# Patient Record
Sex: Female | Born: 1955 | Race: Black or African American | Hispanic: No | Marital: Married | Smoking: Never smoker
Health system: Southern US, Community
[De-identification: ages and names within clinical notes are randomized; demographics above are authoritative.]

## PROBLEM LIST (undated history)

## (undated) DIAGNOSIS — M545 Low back pain, unspecified: Secondary | ICD-10-CM

## (undated) DIAGNOSIS — I639 Cerebral infarction, unspecified: Secondary | ICD-10-CM

## (undated) DIAGNOSIS — E785 Hyperlipidemia, unspecified: Secondary | ICD-10-CM

## (undated) DIAGNOSIS — Z91018 Allergy to other foods: Secondary | ICD-10-CM

## (undated) DIAGNOSIS — D1771 Benign lipomatous neoplasm of kidney: Secondary | ICD-10-CM

## (undated) DIAGNOSIS — T4145XA Adverse effect of unspecified anesthetic, initial encounter: Secondary | ICD-10-CM

## (undated) DIAGNOSIS — M1990 Unspecified osteoarthritis, unspecified site: Secondary | ICD-10-CM

## (undated) DIAGNOSIS — J40 Bronchitis, not specified as acute or chronic: Secondary | ICD-10-CM

## (undated) DIAGNOSIS — R7303 Prediabetes: Secondary | ICD-10-CM

## (undated) DIAGNOSIS — K219 Gastro-esophageal reflux disease without esophagitis: Secondary | ICD-10-CM

## (undated) DIAGNOSIS — R112 Nausea with vomiting, unspecified: Secondary | ICD-10-CM

## (undated) DIAGNOSIS — Z9889 Other specified postprocedural states: Secondary | ICD-10-CM

## (undated) DIAGNOSIS — H409 Unspecified glaucoma: Secondary | ICD-10-CM

## (undated) DIAGNOSIS — H47099 Other disorders of optic nerve, not elsewhere classified, unspecified eye: Secondary | ICD-10-CM

## (undated) DIAGNOSIS — J3089 Other allergic rhinitis: Secondary | ICD-10-CM

## (undated) DIAGNOSIS — G4730 Sleep apnea, unspecified: Secondary | ICD-10-CM

## (undated) DIAGNOSIS — G459 Transient cerebral ischemic attack, unspecified: Secondary | ICD-10-CM

## (undated) DIAGNOSIS — J189 Pneumonia, unspecified organism: Secondary | ICD-10-CM

## (undated) DIAGNOSIS — T8859XA Other complications of anesthesia, initial encounter: Secondary | ICD-10-CM

## (undated) DIAGNOSIS — I10 Essential (primary) hypertension: Secondary | ICD-10-CM

## (undated) DIAGNOSIS — E559 Vitamin D deficiency, unspecified: Secondary | ICD-10-CM

## (undated) DIAGNOSIS — M069 Rheumatoid arthritis, unspecified: Secondary | ICD-10-CM

## (undated) DIAGNOSIS — E119 Type 2 diabetes mellitus without complications: Secondary | ICD-10-CM

## (undated) HISTORY — DX: Low back pain: M54.5

## (undated) HISTORY — PX: CATARACT EXTRACTION: SUR2

## (undated) HISTORY — PX: BREAST CYST EXCISION: SHX579

## (undated) HISTORY — PX: COLONOSCOPY: SHX174

## (undated) HISTORY — DX: Benign lipomatous neoplasm of kidney: D17.71

## (undated) HISTORY — DX: Allergy to other foods: Z91.018

## (undated) HISTORY — PX: BACK SURGERY: SHX140

## (undated) HISTORY — PX: FINGER SURGERY: SHX640

## (undated) HISTORY — DX: Cerebral infarction, unspecified: I63.9

## (undated) HISTORY — PX: BUNIONECTOMY: SHX129

## (undated) HISTORY — DX: Vitamin D deficiency, unspecified: E55.9

## (undated) HISTORY — DX: Transient cerebral ischemic attack, unspecified: G45.9

## (undated) HISTORY — DX: Rheumatoid arthritis, unspecified: M06.9

## (undated) HISTORY — DX: Low back pain, unspecified: M54.50

## (undated) HISTORY — PX: EYE SURGERY: SHX253

## (undated) HISTORY — DX: Prediabetes: R73.03

## (undated) HISTORY — PX: ABDOMINAL HYSTERECTOMY: SHX81

## (undated) HISTORY — DX: Sleep apnea, unspecified: G47.30

---

## 2000-01-16 ENCOUNTER — Other Ambulatory Visit: Admission: RE | Admit: 2000-01-16 | Discharge: 2000-01-16 | Payer: Self-pay | Admitting: Obstetrics and Gynecology

## 2000-01-28 ENCOUNTER — Encounter: Payer: Self-pay | Admitting: Obstetrics and Gynecology

## 2000-01-28 ENCOUNTER — Encounter: Admission: RE | Admit: 2000-01-28 | Discharge: 2000-01-28 | Payer: Self-pay | Admitting: Obstetrics and Gynecology

## 2001-01-19 ENCOUNTER — Other Ambulatory Visit: Admission: RE | Admit: 2001-01-19 | Discharge: 2001-01-19 | Payer: Self-pay | Admitting: Obstetrics and Gynecology

## 2001-02-04 ENCOUNTER — Encounter: Admission: RE | Admit: 2001-02-04 | Discharge: 2001-02-04 | Payer: Self-pay | Admitting: Obstetrics and Gynecology

## 2001-02-04 ENCOUNTER — Encounter: Payer: Self-pay | Admitting: Obstetrics and Gynecology

## 2002-03-04 ENCOUNTER — Other Ambulatory Visit: Admission: RE | Admit: 2002-03-04 | Discharge: 2002-03-04 | Payer: Self-pay | Admitting: Obstetrics and Gynecology

## 2002-03-28 ENCOUNTER — Encounter: Payer: Self-pay | Admitting: Obstetrics and Gynecology

## 2002-03-28 ENCOUNTER — Encounter: Admission: RE | Admit: 2002-03-28 | Discharge: 2002-03-28 | Payer: Self-pay | Admitting: Obstetrics and Gynecology

## 2003-01-04 ENCOUNTER — Encounter: Admission: RE | Admit: 2003-01-04 | Discharge: 2003-01-04 | Payer: Self-pay | Admitting: Family Medicine

## 2003-01-04 ENCOUNTER — Encounter: Payer: Self-pay | Admitting: Family Medicine

## 2003-01-17 ENCOUNTER — Encounter: Payer: Self-pay | Admitting: Family Medicine

## 2003-01-17 ENCOUNTER — Encounter: Admission: RE | Admit: 2003-01-17 | Discharge: 2003-01-17 | Payer: Self-pay | Admitting: Family Medicine

## 2003-01-31 ENCOUNTER — Encounter: Admission: RE | Admit: 2003-01-31 | Discharge: 2003-01-31 | Payer: Self-pay | Admitting: Family Medicine

## 2003-01-31 ENCOUNTER — Encounter: Payer: Self-pay | Admitting: Family Medicine

## 2003-05-15 ENCOUNTER — Other Ambulatory Visit: Admission: RE | Admit: 2003-05-15 | Discharge: 2003-05-15 | Payer: Self-pay | Admitting: Obstetrics and Gynecology

## 2003-06-19 ENCOUNTER — Encounter: Admission: RE | Admit: 2003-06-19 | Discharge: 2003-06-19 | Payer: Self-pay | Admitting: Obstetrics and Gynecology

## 2003-06-30 ENCOUNTER — Encounter: Admission: RE | Admit: 2003-06-30 | Discharge: 2003-06-30 | Payer: Self-pay | Admitting: General Surgery

## 2004-02-02 ENCOUNTER — Encounter: Admission: RE | Admit: 2004-02-02 | Discharge: 2004-02-02 | Payer: Self-pay | Admitting: Obstetrics and Gynecology

## 2005-03-03 ENCOUNTER — Ambulatory Visit: Payer: Self-pay | Admitting: Internal Medicine

## 2005-03-14 ENCOUNTER — Encounter: Admission: RE | Admit: 2005-03-14 | Discharge: 2005-03-14 | Payer: Self-pay | Admitting: Obstetrics and Gynecology

## 2005-03-14 ENCOUNTER — Ambulatory Visit: Payer: Self-pay | Admitting: Internal Medicine

## 2005-03-17 ENCOUNTER — Ambulatory Visit: Payer: Self-pay | Admitting: Internal Medicine

## 2005-05-06 ENCOUNTER — Ambulatory Visit: Payer: Self-pay | Admitting: Internal Medicine

## 2006-04-10 ENCOUNTER — Encounter: Admission: RE | Admit: 2006-04-10 | Discharge: 2006-04-10 | Payer: Self-pay | Admitting: Obstetrics and Gynecology

## 2006-04-22 ENCOUNTER — Encounter: Admission: RE | Admit: 2006-04-22 | Discharge: 2006-04-22 | Payer: Self-pay | Admitting: Obstetrics and Gynecology

## 2006-10-02 ENCOUNTER — Ambulatory Visit: Payer: Self-pay | Admitting: Internal Medicine

## 2006-10-02 LAB — CONVERTED CEMR LAB
ALT: 25 units/L (ref 0–40)
Albumin: 3.3 g/dL — ABNORMAL LOW (ref 3.5–5.2)
Alkaline Phosphatase: 58 units/L (ref 39–117)
BUN: 11 mg/dL (ref 6–23)
Basophils Absolute: 0 10*3/uL (ref 0.0–0.1)
Basophils Relative: 0.1 % (ref 0.0–1.0)
CO2: 30 meq/L (ref 19–32)
Calcium: 9.1 mg/dL (ref 8.4–10.5)
Creatinine, Ser: 0.8 mg/dL (ref 0.4–1.2)
GFR calc Af Amer: 97 mL/min
Monocytes Relative: 7.6 % (ref 3.0–11.0)
Platelets: 355 10*3/uL (ref 150–400)
RDW: 11.9 % (ref 11.5–14.6)
Total Protein: 6.8 g/dL (ref 6.0–8.3)

## 2007-06-17 ENCOUNTER — Encounter: Admission: RE | Admit: 2007-06-17 | Discharge: 2007-06-17 | Payer: Self-pay | Admitting: Obstetrics and Gynecology

## 2007-07-23 DIAGNOSIS — K589 Irritable bowel syndrome without diarrhea: Secondary | ICD-10-CM | POA: Insufficient documentation

## 2007-07-23 DIAGNOSIS — M899 Disorder of bone, unspecified: Secondary | ICD-10-CM | POA: Insufficient documentation

## 2007-07-23 DIAGNOSIS — K219 Gastro-esophageal reflux disease without esophagitis: Secondary | ICD-10-CM | POA: Insufficient documentation

## 2007-07-23 DIAGNOSIS — G43909 Migraine, unspecified, not intractable, without status migrainosus: Secondary | ICD-10-CM | POA: Insufficient documentation

## 2007-07-23 DIAGNOSIS — M949 Disorder of cartilage, unspecified: Secondary | ICD-10-CM

## 2007-07-26 ENCOUNTER — Ambulatory Visit: Payer: Self-pay | Admitting: Internal Medicine

## 2008-02-11 ENCOUNTER — Ambulatory Visit (HOSPITAL_COMMUNITY): Admission: RE | Admit: 2008-02-11 | Discharge: 2008-02-12 | Payer: Self-pay | Admitting: Neurosurgery

## 2008-06-22 ENCOUNTER — Encounter: Admission: RE | Admit: 2008-06-22 | Discharge: 2008-06-22 | Payer: Self-pay | Admitting: Obstetrics and Gynecology

## 2008-07-06 ENCOUNTER — Ambulatory Visit: Payer: Self-pay | Admitting: Internal Medicine

## 2008-07-06 DIAGNOSIS — M940 Chondrocostal junction syndrome [Tietze]: Secondary | ICD-10-CM

## 2008-07-06 DIAGNOSIS — T7840XA Allergy, unspecified, initial encounter: Secondary | ICD-10-CM | POA: Insufficient documentation

## 2008-07-06 DIAGNOSIS — L272 Dermatitis due to ingested food: Secondary | ICD-10-CM

## 2009-07-09 ENCOUNTER — Encounter: Admission: RE | Admit: 2009-07-09 | Discharge: 2009-07-09 | Payer: Self-pay | Admitting: Obstetrics and Gynecology

## 2010-01-24 ENCOUNTER — Encounter: Admission: RE | Admit: 2010-01-24 | Discharge: 2010-01-24 | Payer: Self-pay | Admitting: Neurosurgery

## 2010-02-27 ENCOUNTER — Inpatient Hospital Stay (HOSPITAL_COMMUNITY): Admission: RE | Admit: 2010-02-27 | Discharge: 2010-03-03 | Payer: Self-pay | Admitting: Neurosurgery

## 2010-04-09 ENCOUNTER — Encounter: Admission: RE | Admit: 2010-04-09 | Discharge: 2010-04-09 | Payer: Self-pay | Admitting: Neurosurgery

## 2010-05-14 ENCOUNTER — Encounter
Admission: RE | Admit: 2010-05-14 | Discharge: 2010-05-14 | Payer: Self-pay | Source: Home / Self Care | Attending: Neurosurgery | Admitting: Neurosurgery

## 2010-06-27 ENCOUNTER — Encounter
Admission: RE | Admit: 2010-06-27 | Discharge: 2010-06-27 | Payer: Self-pay | Source: Home / Self Care | Attending: Neurosurgery | Admitting: Neurosurgery

## 2010-07-17 ENCOUNTER — Other Ambulatory Visit: Payer: Self-pay | Admitting: Obstetrics and Gynecology

## 2010-07-17 DIAGNOSIS — Z1231 Encounter for screening mammogram for malignant neoplasm of breast: Secondary | ICD-10-CM

## 2010-07-17 DIAGNOSIS — M8580 Other specified disorders of bone density and structure, unspecified site: Secondary | ICD-10-CM

## 2010-07-24 ENCOUNTER — Ambulatory Visit
Admission: RE | Admit: 2010-07-24 | Discharge: 2010-07-24 | Disposition: A | Discharge status: Home / Self Care | Payer: BC Managed Care – PPO | Source: Ambulatory Visit | Attending: Obstetrics and Gynecology | Admitting: Obstetrics and Gynecology

## 2010-07-24 DIAGNOSIS — M8580 Other specified disorders of bone density and structure, unspecified site: Secondary | ICD-10-CM

## 2010-07-24 DIAGNOSIS — Z1231 Encounter for screening mammogram for malignant neoplasm of breast: Secondary | ICD-10-CM

## 2010-08-15 LAB — CBC
HCT: 40.2 % (ref 36.0–46.0)
MCH: 32.8 pg (ref 26.0–34.0)
MCV: 98.5 fL (ref 78.0–100.0)
Platelets: 339 10*3/uL (ref 150–400)
RBC: 4.08 MIL/uL (ref 3.87–5.11)

## 2010-10-01 ENCOUNTER — Other Ambulatory Visit: Payer: Self-pay | Admitting: Neurosurgery

## 2010-10-01 DIAGNOSIS — M545 Low back pain: Secondary | ICD-10-CM

## 2010-10-15 NOTE — Assessment & Plan Note (Signed)
[facility]                         GASTROENTEROLOGY OFFICE NOTE   NAME:[name], [name] LANES                      MRN:          [id number]  DATE:07/26/2007                            DOB:          [birth year]    CHIEF COMPLAINT:  Acid reflux in the evening and upper back pains as  well and not sleeping well due to reflux worsening.   HISTORY:  [name] [name] is on pantoprazole 40 mg daily.  It does a good job  throughout the day usually.  Sometimes at night she wakens with sort of  back discomfort and a burning in her chest.  She is not smoking, she has  minimal caffeine.  Her weight is going up despite trying to lose.  She  is 209 pounds today, she was 202 pounds in May of 2008, in 2006 she was  186 pounds.  She had labs in May of 2008 with a normal TSH, normal  metabolic panel, CBC, CMET, i.e. except for an albumin of 3.3 and a  trivial elevated glucose of 101.  She is not describing dysphagia.  She  is walking daily.  She is trying to cut portions, she tells me.  If she  forgets her Protonix she says she cannot eat at all.  She brings a sheet  showing that she has FOOD REACTIONS TO MSG, YELLOW ONIONS, DAIRY,  PRESERVATIVES, EGGPLANT SKIN, APPLE SKIN, CORNMEAL, PEANUTS, RED FOOD  COLORING.  SHE HAS GRASS ALLERGIES.  Certain plants and trees cause a  rash like corn plant and grass and other trees irritate her eyes and  nose.  SHE HAS DUST ALLERGIES, AMMONIA AND CERTAIN PERFUME ALLERGIES.  She is followed by Dr. Larina Bras at [facility].   DRUG ALLERGIES:  1. AUGMENTIN.  2. ERYTHROMYCIN.  3. CODEINE DOES CAUSE VOMITING.   CURRENT MEDICATIONS:  1. Cenestin 1.5 mg daily.  2. Singulair 10 mg daily.  3. Multivitamin daily.  4. Protonix 40 mg daily.  5. Allergy shots.   REVIEW OF SYSTEMS:  As above.  She is also complaining of a left upper  quadrant and a right lower groin pain.  They come and go without clear  precipitants or  triggers.  They have been there for about 6 months.  She  is somewhat concerned that she could have a serious problem related to  this.  She had a lung nodule in the right lower lobe with CTs twice over  the last year that show stability.  She is wondering about getting a CT  of the abdomen.   PAST MEDICAL HISTORY:  See note Oct 22, 2006, plus above.  EGD,  colonoscopy had previously been performed with unrevealing findings  except for nonerosive gastritis (H. pylori negative) and hemorrhoids and  an irritable bowel response on the colonoscopy.   SOCIAL HISTORY:  She is not a smoker.   PHYSICAL EXAM:  Obese, pleasant black woman in no acute distress.  Blood  pressure is 162/90, pulse 100, weight 209 pounds.  EYES:  Anicteric.  NECK:  Supple.  CHEST:  Clear, resonant.  HEART:  S1 S2.  I hear no rubs, murmurs or gallops.  The chest wall does  not appear tender.  ABDOMEN:  Soft.  There is some minimal tenderness to deep palpation in  the right groin.  Otherwise, there is no pain with hip flexion or  movement of the lower extremity there.  Her abdomen is otherwise obese  and benign without organomegaly and mass and no secussion splash.  Bowel  sounds are present, there are no bruits.  Skin is warm and dry without  rash in the upper trunk.  PSYCH:  She has an appropriate affect.  LYMPH NODES:  No neck or supraclavicular nodes palpated.   ASSESSMENT:  1. Gastroesophageal reflux disease with worsening symptoms.  2. Obesity with increasing weight despite measures to try to lose,      normal TSH almost a year ago.  3. Irritable bowel syndrome.  4. Abdominal pain, mainly right groin pain for the past 6 months, no      worrisome features here.   PLAN:  1. Discontinue pantoprazole and try Nexium 40 mg daily.  2. Continue to try to lose weight but she should have a followup visit      with her primary care physician in [place] (Dr. Renella Cunas),      [facility] in [place].  She can  have repeat labs, she can have      recheck of her blood pressure.  Her albumin was slightly down.  She      certainly may need urinalysis and followup of that through primary      care, question if she has urinary or kidney losses of albumin.  I      would not perform a CT scan for abdominal pain in what we are      seeing here.  3. She can follow up with Gynecology to discuss how long she needs to      take hormone replacement therapy.  4. I will see her back as needed.  She was given samples of the Nexium      for about 2 weeks and a 90-day prescription.  If that is not      working, she should return.  I do not think any other investigation      is necessary based upon what I am hearing and seeing right now.     Iva Boop, MD,FACG  Electronically Signed    CEG/MedQ  DD: 07/26/2007  DT: 07/26/2007  Job #: [id number]

## 2010-10-15 NOTE — Op Note (Signed)
[name], [name]               ACCOUNT NO.:  [id number]   MEDICAL RECORD NO.:  [id number]          PATIENT TYPE:  OIB   LOCATION:  3538                         FACILITY:  [facility]   PHYSICIAN:  Donalee Citrin, M.D.        DATE OF BIRTH:  1955/11/16   DATE OF PROCEDURE:  02/11/2008  DATE OF DISCHARGE:                               OPERATIVE REPORT   PREOPERATIVE DIAGNOSIS:  Right L5 radiculopathy from ruptured disk L4-5  right.   PROCEDURES:  1. Lumbar laminectomy.  2. Microdiskectomy L4-5.  3. Microscopic dissection of the right L5 nerve root.  4. Microdiskectomy.   SURGEON:  Donalee Citrin, MD   ASSISTANT:  Reinaldo Meeker, MD   ANESTHESIA:  General endotracheal.   HISTORY OF PRESENT ILLNESS:  The patient is a very pleasant 55 year old  female who has had progressive worsening back and right leg pain  radiating down the back of her leg, calf, foot, big toe consistent with  L5 nerve root pattern.  MRI scan showed annular tear and disk bulge  causing severe lateral recess stenosis on the L5 root at L4-5.  The  risks and benefits of the operation were explained to the patient and  she understands and agreed to proceed forward.   The patient was brought to the OR, induced under general anesthesia,  positioned prone on the Pfister frame.  Her back was prepped  and draped  in the usual sterile fashion.  A preoperative x-ray localized the  appropriate level.  After infiltration with 10 mL of lidocaine with  epinephrine, a midline incision was made and Bovie electrocautery was  used to take down the subperiosteal.  Dissection was carried down the  lamina of L4 and L5 on the right.  Intraoperative x-ray confirmed the  position at appropriate level.  Then using a high-speed drill, the  inferior aspect of the L4 medial facet complex, the superior aspect of  the L5 was drilled down.  Then using a 2 and 3-mm Kerrison punch, the  undersurface of lamina and medial facet complex was bitten away  exposing  the ligamentum flavum which was removed in a piecemeal fashion exposing  the thecal sac.  Then working cephalocaudally and underbiting the facet  complex lateralized to the dura was identified as the proximal L5 nerve  root.  This was all decompressed out the foramen.  The foramen was  unroofed.  The disk was inspected and was noted to be bulging causing  severe stenosis in the undersurface of the fiber.  Annulotomy was made  with an 11-blade scalpel and the disk space was cleaned out with  pituitary rongeurs.  Then at the end of the diskectomy there was no  further stenosis on the thecal sac or L5 nerve root.  The undersurface  of the medial facet complex was extended superiorly.  The L4 neural  foramen was then explored with a coronary dilator and angled hockey  stick and noted to be widely patent as well and at the end of the  diskectomy, there was no further stenosis on either the L4 or  the L5  root and thecal sac laterally. So the wound was then copiously irrigated  and meticulous hemostasis was maintained.  Gelfoam was laid  on top of the dura.  Muscle fascia was reapproximated in layers with  interrupted Vicryl and the skin was closed with running 4-0  subcuticular.  Benzoin and Steri-strips were applied.  The patient went  to recovery room in stable condition.  At the end of the case,  __________ counts were correct.           ______________________________  Donalee Citrin, M.D.     GC/MEDQ  D:  02/11/2008  T:  02/12/2008  Job:  [id number]

## 2010-10-18 NOTE — Assessment & Plan Note (Signed)
[facility]                         GASTROENTEROLOGY OFFICE NOTE   NAME:[name] [name], [name] [name]                      MRN:          [id number]  DATE:10/02/2006                            DOB:          [birth year]    ADDENDUM   We will also check a CMET, CBC with diff, and TSH for a general lab  profile on her.  Hypothyroidism could certainly cause weight gain and  some of these problems as well.     Iva Boop, MD,FACG  Electronically Signed    CEG/MedQ  DD: 10/02/2006  DT: 10/02/2006  Job #: [id number]

## 2010-10-18 NOTE — Assessment & Plan Note (Signed)
[name]                         GASTROENTEROLOGY OFFICE NOTE   NAME:[name] [name], [name] [name]                      MRN:          [id number]  DATE:10/02/2006                            DOB:          1956-04-30    CHIEF COMPLAINT:  Reflux.  Bloating.   [name] [name].  I have not seen her since December 2006.  She has had a  lot of chronic bloating, early satiety, and reflux.  At that time, she  was placed on Protonix, which worked well for heartburn, but did not  really help her early satiety and bloating.  EGD and colonoscopy have  shown nonerosive gastritis and a normal colonoscopy, except for  hemorrhoids.  There was a strong irritable bowel response.  She is  basically having the same problems.  Her weight is up 15 pounds or more  since that time, though she says she does not eat very much at all.  She  feels bloated, and when she tries to exercise at work, at the end of the  day, she feels like her food has not emptied and she is regurgitating.  Particularly with lunch, she has problems where she feels early satiety  and a lot of bloating.  Bowel movements are good on Activa, yogurt, and  increased fiber, and green vegetables (i.e., regular bowel movements  without problems).  She still has problems with a lot of food allergies  and MSG problems.   PAST MEDICAL HISTORY:  1. Nonerosive gastritis.  2. Gastroesophageal reflux disease.  3. Irritable bowel syndrome.  4. External hemorrhoids.  5. L4-5 herniated disk.  6. Total abdominal hysterectomy.  7. Osteopenia.  8. Right breast lump in the past.  9. Migraine headaches.  10.Prior bunionectomy.  11.Multiple allergies.   DRUG ALLERGIES:  CODEINE causes nausea and vomiting.  ERYTHROMYCIN  causes swelling.   MEDICATIONS:  1. Cenestin 1.5 mg daily.  2. Singulair 10 mg daily.  3. Allergy shots.  4. Multivitamin p.r.n.   PHYSICAL EXAM:  Weight 202 pounds, pulse 96, blood pressure 140/80.  EYES:   Anicteric.  NECK:  Supple.  No thyromegaly or mass.  ABDOMEN:  Obese, soft, and nontender.  No organomegaly or mass.  Bowel  sounds present.  PSYCH:  She is alert and oriented x3.   ASSESSMENT:  1. Gastroesophageal reflux disease.  2. Bloating, early satiety, question functional dyspepsia, gastric      dysmotility.  She really is not at any great risk for gastroparesis      and she does not vomit, so I do not think it is that.  I think it      is her irritable bowel.  3. Obesity.   PLAN:  1. Protonix 40 mg daily will be re-instituted.  She has not had that      in several months when she ran out and her prescription ended.  2. Trial of metoclopramide 5 mg before meals 1 to 2 as needed.  She is      warned about possible neuropathy or dystonic reaction in the short      term.  We  will determine if long-term use is appropriate.  She is      to use it p.r.n.  3. Follow up in 6 to 8 weeks.  4. Regarding her obesity, caloric restriction is discussed.  I told      her to try to count her calories to see how much she is eating.      S[employer] Buster[employer] Diet or [facility] Diet are options discussed.     [name] Boop, MD,FACG  Electronically Signed    CEG/MedQ  DD: 10/02/2006  DT: 10/02/2006  Job #: [id number]

## 2011-02-13 ENCOUNTER — Other Ambulatory Visit: Payer: BC Managed Care – PPO

## 2011-02-13 ENCOUNTER — Ambulatory Visit
Admission: RE | Admit: 2011-02-13 | Discharge: 2011-02-13 | Disposition: A | Discharge status: Home / Self Care | Payer: BC Managed Care – PPO | Source: Ambulatory Visit | Attending: Neurosurgery | Admitting: Neurosurgery

## 2011-02-13 DIAGNOSIS — M545 Low back pain: Secondary | ICD-10-CM

## 2011-03-05 LAB — CBC
HCT: 38.4
Hemoglobin: 13.3
MCHC: 34.5
MCV: 94.7
RBC: 4.06

## 2011-03-05 LAB — BASIC METABOLIC PANEL
CO2: 25
Chloride: 106
GFR calc Af Amer: >60
Potassium: 3.9
Sodium: 139

## 2011-08-27 ENCOUNTER — Other Ambulatory Visit: Payer: Self-pay | Admitting: Obstetrics and Gynecology

## 2011-08-27 DIAGNOSIS — Z1231 Encounter for screening mammogram for malignant neoplasm of breast: Secondary | ICD-10-CM

## 2011-09-02 ENCOUNTER — Ambulatory Visit
Admission: RE | Admit: 2011-09-02 | Discharge: 2011-09-02 | Disposition: A | Discharge status: Home / Self Care | Payer: BC Managed Care – PPO | Source: Ambulatory Visit | Attending: Obstetrics and Gynecology | Admitting: Obstetrics and Gynecology

## 2011-09-02 DIAGNOSIS — Z1231 Encounter for screening mammogram for malignant neoplasm of breast: Secondary | ICD-10-CM

## 2012-06-02 DIAGNOSIS — J189 Pneumonia, unspecified organism: Secondary | ICD-10-CM

## 2012-06-02 HISTORY — DX: Pneumonia, unspecified organism: J18.9

## 2012-07-27 ENCOUNTER — Other Ambulatory Visit: Payer: Self-pay | Admitting: Obstetrics and Gynecology

## 2012-07-27 DIAGNOSIS — Z1231 Encounter for screening mammogram for malignant neoplasm of breast: Secondary | ICD-10-CM

## 2012-08-25 ENCOUNTER — Other Ambulatory Visit: Payer: Self-pay | Admitting: Endocrinology

## 2012-08-25 DIAGNOSIS — M8580 Other specified disorders of bone density and structure, unspecified site: Secondary | ICD-10-CM

## 2012-09-02 ENCOUNTER — Ambulatory Visit: Payer: BC Managed Care – PPO

## 2012-09-03 ENCOUNTER — Other Ambulatory Visit: Payer: BC Managed Care – PPO

## 2012-09-21 ENCOUNTER — Ambulatory Visit: Payer: BC Managed Care – PPO

## 2012-09-21 ENCOUNTER — Other Ambulatory Visit: Payer: BC Managed Care – PPO

## 2012-10-18 ENCOUNTER — Ambulatory Visit
Admission: RE | Admit: 2012-10-18 | Discharge: 2012-10-18 | Disposition: A | Discharge status: Home / Self Care | Payer: BC Managed Care – PPO | Source: Ambulatory Visit | Attending: Endocrinology | Admitting: Endocrinology

## 2012-10-18 ENCOUNTER — Ambulatory Visit
Admission: RE | Admit: 2012-10-18 | Discharge: 2012-10-18 | Disposition: A | Discharge status: Home / Self Care | Payer: BC Managed Care – PPO | Source: Ambulatory Visit | Attending: Obstetrics and Gynecology | Admitting: Obstetrics and Gynecology

## 2012-10-18 DIAGNOSIS — M8580 Other specified disorders of bone density and structure, unspecified site: Secondary | ICD-10-CM

## 2012-10-18 DIAGNOSIS — Z1231 Encounter for screening mammogram for malignant neoplasm of breast: Secondary | ICD-10-CM

## 2012-10-19 ENCOUNTER — Other Ambulatory Visit: Payer: Self-pay | Admitting: Obstetrics and Gynecology

## 2012-10-19 DIAGNOSIS — R928 Other abnormal and inconclusive findings on diagnostic imaging of breast: Secondary | ICD-10-CM

## 2012-11-01 ENCOUNTER — Ambulatory Visit
Admission: RE | Admit: 2012-11-01 | Discharge: 2012-11-01 | Disposition: A | Discharge status: Home / Self Care | Payer: BC Managed Care – PPO | Source: Ambulatory Visit | Attending: Obstetrics and Gynecology | Admitting: Obstetrics and Gynecology

## 2012-11-01 DIAGNOSIS — R928 Other abnormal and inconclusive findings on diagnostic imaging of breast: Secondary | ICD-10-CM

## 2013-01-26 ENCOUNTER — Other Ambulatory Visit: Payer: Self-pay | Admitting: Neurosurgery

## 2013-01-26 DIAGNOSIS — M4800 Spinal stenosis, site unspecified: Secondary | ICD-10-CM

## 2013-02-04 ENCOUNTER — Ambulatory Visit
Admission: RE | Admit: 2013-02-04 | Discharge: 2013-02-04 | Disposition: A | Discharge status: Home / Self Care | Payer: BC Managed Care – PPO | Source: Ambulatory Visit | Attending: Neurosurgery | Admitting: Neurosurgery

## 2013-02-04 ENCOUNTER — Other Ambulatory Visit: Payer: BC Managed Care – PPO

## 2013-02-04 DIAGNOSIS — M4800 Spinal stenosis, site unspecified: Secondary | ICD-10-CM

## 2013-02-04 MED ORDER — METHYLPREDNISOLONE ACETATE 40 MG/ML INJ SUSP (RADIOLOG
120.0000 mg | Freq: Once | INTRAMUSCULAR | Status: AC
  Administered 2013-02-04: 120 mg via EPIDURAL

## 2013-02-04 MED ORDER — IOHEXOL 180 MG/ML  SOLN
1.0000 mL | Freq: Once | INTRAMUSCULAR | Status: AC | PRN
  Administered 2013-02-04: 1 mL via EPIDURAL

## 2013-06-01 ENCOUNTER — Other Ambulatory Visit: Payer: Self-pay | Admitting: Obstetrics and Gynecology

## 2013-06-01 DIAGNOSIS — N6001 Solitary cyst of right breast: Secondary | ICD-10-CM

## 2013-06-17 ENCOUNTER — Ambulatory Visit
Admission: RE | Admit: 2013-06-17 | Discharge: 2013-06-17 | Disposition: A | Discharge status: Home / Self Care | Payer: BC Managed Care – PPO | Source: Ambulatory Visit | Attending: Obstetrics and Gynecology | Admitting: Obstetrics and Gynecology

## 2013-06-17 DIAGNOSIS — N6001 Solitary cyst of right breast: Secondary | ICD-10-CM

## 2013-11-21 ENCOUNTER — Other Ambulatory Visit: Payer: Self-pay | Admitting: Neurosurgery

## 2013-11-21 DIAGNOSIS — M5127 Other intervertebral disc displacement, lumbosacral region: Secondary | ICD-10-CM

## 2013-11-22 ENCOUNTER — Other Ambulatory Visit (HOSPITAL_COMMUNITY): Payer: Self-pay

## 2013-11-22 DIAGNOSIS — R0602 Shortness of breath: Secondary | ICD-10-CM

## 2013-11-25 ENCOUNTER — Other Ambulatory Visit: Payer: Self-pay | Admitting: Neurosurgery

## 2013-11-25 ENCOUNTER — Ambulatory Visit
Admission: RE | Admit: 2013-11-25 | Discharge: 2013-11-25 | Disposition: A | Discharge status: Home / Self Care | Payer: BC Managed Care – PPO | Source: Ambulatory Visit | Attending: Neurosurgery | Admitting: Neurosurgery

## 2013-11-25 DIAGNOSIS — M5127 Other intervertebral disc displacement, lumbosacral region: Secondary | ICD-10-CM

## 2013-11-25 MED ORDER — IOHEXOL 180 MG/ML  SOLN
1.0000 mL | Freq: Once | INTRAMUSCULAR | Status: AC | PRN
  Administered 2013-11-25: 1 mL via INTRA_ARTICULAR

## 2013-11-25 MED ORDER — METHYLPREDNISOLONE ACETATE 40 MG/ML INJ SUSP (RADIOLOG
120.0000 mg | Freq: Once | INTRAMUSCULAR | Status: AC
  Administered 2013-11-25: 120 mg via INTRA_ARTICULAR

## 2013-11-25 NOTE — Discharge Instructions (Signed)

## 2013-11-28 ENCOUNTER — Ambulatory Visit (HOSPITAL_COMMUNITY)
Admission: RE | Admit: 2013-11-28 | Discharge: 2013-11-28 | Disposition: A | Discharge status: Home / Self Care | Payer: BC Managed Care – PPO | Source: Ambulatory Visit | Attending: Family Medicine | Admitting: Family Medicine

## 2013-11-28 DIAGNOSIS — R0602 Shortness of breath: Secondary | ICD-10-CM | POA: Insufficient documentation

## 2013-11-28 MED ORDER — ALBUTEROL SULFATE (2.5 MG/3ML) 0.083% IN NEBU
2.5000 mg | INHALATION_SOLUTION | Freq: Once | RESPIRATORY_TRACT | Status: AC
  Administered 2013-11-28: 2.5 mg via RESPIRATORY_TRACT

## 2013-11-29 LAB — PULMONARY FUNCTION TEST
DL/VA % pred: 114 %
DL/VA: 5.62 ml/min/mmHg/L
DLCO UNC: 21.95 ml/min/mmHg
DLCO cor % pred: 85 %
DLCO cor: 21.95 ml/min/mmHg
DLCO unc % pred: 85 %
FEF 25-75 Post: 2.32 L/sec
FEF 25-75 Pre: 2.02 L/sec
FEF2575-%CHANGE-POST: 14 %
FEF2575-%PRED-PRE: 93 %
FEF2575-%Pred-Post: 106 %
FEV1-%CHANGE-POST: 1 %
FEV1-%PRED-PRE: 98 %
FEV1-%Pred-Post: 100 %
FEV1-POST: 2.2 L
FEV1-Pre: 2.17 L
FEV1FVC-%CHANGE-POST: 9 %
FEV1FVC-%Pred-Pre: 98 %
FEV6-%CHANGE-POST: -5 %
FEV6-%PRED-POST: 95 %
FEV6-%PRED-PRE: 101 %
FEV6-PRE: 2.73 L
FEV6-Post: 2.57 L
FEV6FVC-%Change-Post: 0 %
FEV6FVC-%PRED-POST: 102 %
FEV6FVC-%PRED-PRE: 101 %
FVC-%Change-Post: -6 %
FVC-%PRED-POST: 92 %
FVC-%Pred-Pre: 99 %
FVC-POST: 2.57 L
FVC-PRE: 2.76 L
POST FEV1/FVC RATIO: 86 %
POST FEV6/FVC RATIO: 100 %
Pre FEV1/FVC ratio: 78 %
Pre FEV6/FVC Ratio: 99 %
RV % pred: 72 %
RV: 1.47 L
TLC % PRED: 80 %
TLC: 4.18 L

## 2014-02-10 ENCOUNTER — Other Ambulatory Visit: Payer: Self-pay | Admitting: Obstetrics and Gynecology

## 2014-02-10 DIAGNOSIS — N6489 Other specified disorders of breast: Secondary | ICD-10-CM

## 2014-02-17 ENCOUNTER — Ambulatory Visit
Admission: RE | Admit: 2014-02-17 | Discharge: 2014-02-17 | Disposition: A | Discharge status: Home / Self Care | Payer: BC Managed Care – PPO | Source: Ambulatory Visit | Attending: Obstetrics and Gynecology | Admitting: Obstetrics and Gynecology

## 2014-02-17 ENCOUNTER — Encounter (INDEPENDENT_AMBULATORY_CARE_PROVIDER_SITE_OTHER): Payer: Self-pay

## 2014-02-17 DIAGNOSIS — N6489 Other specified disorders of breast: Secondary | ICD-10-CM

## 2014-04-19 ENCOUNTER — Other Ambulatory Visit: Payer: Self-pay | Admitting: Neurosurgery

## 2014-04-19 DIAGNOSIS — M5416 Radiculopathy, lumbar region: Secondary | ICD-10-CM

## 2014-05-02 ENCOUNTER — Other Ambulatory Visit: Payer: Self-pay | Admitting: Neurosurgery

## 2014-05-02 ENCOUNTER — Ambulatory Visit
Admission: RE | Admit: 2014-05-02 | Discharge: 2014-05-02 | Disposition: A | Discharge status: Home / Self Care | Payer: BC Managed Care – PPO | Source: Ambulatory Visit | Attending: Neurosurgery | Admitting: Neurosurgery

## 2014-05-02 DIAGNOSIS — M5416 Radiculopathy, lumbar region: Secondary | ICD-10-CM

## 2014-05-15 ENCOUNTER — Other Ambulatory Visit: Payer: Self-pay | Admitting: Neurosurgery

## 2014-05-15 DIAGNOSIS — M5136 Other intervertebral disc degeneration, lumbar region: Secondary | ICD-10-CM

## 2014-05-19 ENCOUNTER — Ambulatory Visit
Admission: RE | Admit: 2014-05-19 | Discharge: 2014-05-19 | Disposition: A | Discharge status: Home / Self Care | Payer: BC Managed Care – PPO | Source: Ambulatory Visit | Attending: Neurosurgery | Admitting: Neurosurgery

## 2014-05-19 ENCOUNTER — Other Ambulatory Visit: Payer: Self-pay | Admitting: Neurosurgery

## 2014-05-19 DIAGNOSIS — M5136 Other intervertebral disc degeneration, lumbar region: Secondary | ICD-10-CM

## 2014-05-19 MED ORDER — IOHEXOL 180 MG/ML  SOLN
1.0000 mL | Freq: Once | INTRAMUSCULAR | Status: AC | PRN
  Administered 2014-05-19: 1 mL via EPIDURAL

## 2014-05-19 MED ORDER — METHYLPREDNISOLONE ACETATE 40 MG/ML INJ SUSP (RADIOLOG
120.0000 mg | Freq: Once | INTRAMUSCULAR | Status: AC
  Administered 2014-05-19: 120 mg via EPIDURAL

## 2014-05-19 NOTE — Discharge Instructions (Signed)

## 2014-06-06 ENCOUNTER — Other Ambulatory Visit: Payer: Self-pay | Admitting: Endocrinology

## 2014-06-06 DIAGNOSIS — M8580 Other specified disorders of bone density and structure, unspecified site: Secondary | ICD-10-CM

## 2014-06-06 DIAGNOSIS — Z1231 Encounter for screening mammogram for malignant neoplasm of breast: Secondary | ICD-10-CM

## 2014-06-15 ENCOUNTER — Other Ambulatory Visit: Payer: Self-pay | Admitting: Neurosurgery

## 2014-06-15 DIAGNOSIS — M5137 Other intervertebral disc degeneration, lumbosacral region: Secondary | ICD-10-CM

## 2014-06-23 ENCOUNTER — Other Ambulatory Visit: Payer: Self-pay

## 2014-07-07 ENCOUNTER — Ambulatory Visit
Admission: RE | Admit: 2014-07-07 | Discharge: 2014-07-07 | Disposition: A | Discharge status: Home / Self Care | Payer: BLUE CROSS/BLUE SHIELD | Source: Ambulatory Visit | Attending: Neurosurgery | Admitting: Neurosurgery

## 2014-07-07 DIAGNOSIS — M5137 Other intervertebral disc degeneration, lumbosacral region: Secondary | ICD-10-CM

## 2014-07-07 MED ORDER — METHYLPREDNISOLONE ACETATE 40 MG/ML INJ SUSP (RADIOLOG
120.0000 mg | Freq: Once | INTRAMUSCULAR | Status: AC
  Administered 2014-07-07: 120 mg via EPIDURAL

## 2014-07-07 MED ORDER — IOHEXOL 180 MG/ML  SOLN
1.0000 mL | Freq: Once | INTRAMUSCULAR | Status: AC | PRN
  Administered 2014-07-07: 1 mL via EPIDURAL

## 2014-09-08 ENCOUNTER — Other Ambulatory Visit: Payer: Self-pay | Admitting: Neurosurgery

## 2014-09-08 DIAGNOSIS — M25552 Pain in left hip: Secondary | ICD-10-CM

## 2014-09-12 ENCOUNTER — Ambulatory Visit
Admission: RE | Admit: 2014-09-12 | Discharge: 2014-09-12 | Disposition: A | Discharge status: Home / Self Care | Payer: BLUE CROSS/BLUE SHIELD | Source: Ambulatory Visit | Attending: Neurosurgery | Admitting: Neurosurgery

## 2014-09-12 DIAGNOSIS — M25552 Pain in left hip: Secondary | ICD-10-CM

## 2014-10-06 ENCOUNTER — Other Ambulatory Visit: Payer: Self-pay | Admitting: Sports Medicine

## 2014-10-06 DIAGNOSIS — M25552 Pain in left hip: Secondary | ICD-10-CM

## 2014-10-09 ENCOUNTER — Ambulatory Visit
Admission: RE | Admit: 2014-10-09 | Discharge: 2014-10-09 | Disposition: A | Discharge status: Home / Self Care | Payer: BLUE CROSS/BLUE SHIELD | Source: Ambulatory Visit | Attending: Sports Medicine | Admitting: Sports Medicine

## 2014-10-09 DIAGNOSIS — M25552 Pain in left hip: Secondary | ICD-10-CM

## 2014-11-08 ENCOUNTER — Ambulatory Visit
Admission: RE | Admit: 2014-11-08 | Discharge: 2014-11-08 | Disposition: A | Discharge status: Home / Self Care | Payer: BLUE CROSS/BLUE SHIELD | Source: Ambulatory Visit | Attending: Endocrinology | Admitting: Endocrinology

## 2014-11-08 ENCOUNTER — Other Ambulatory Visit: Payer: Self-pay

## 2014-11-08 DIAGNOSIS — M8580 Other specified disorders of bone density and structure, unspecified site: Secondary | ICD-10-CM

## 2014-11-13 ENCOUNTER — Encounter: Payer: Self-pay | Admitting: Internal Medicine

## 2014-12-27 ENCOUNTER — Other Ambulatory Visit: Payer: Self-pay | Admitting: Neurosurgery

## 2014-12-27 DIAGNOSIS — M5137 Other intervertebral disc degeneration, lumbosacral region: Secondary | ICD-10-CM

## 2015-01-01 ENCOUNTER — Ambulatory Visit
Admission: RE | Admit: 2015-01-01 | Discharge: 2015-01-01 | Disposition: A | Discharge status: Home / Self Care | Payer: BLUE CROSS/BLUE SHIELD | Source: Ambulatory Visit | Attending: Neurosurgery | Admitting: Neurosurgery

## 2015-01-01 DIAGNOSIS — M5137 Other intervertebral disc degeneration, lumbosacral region: Secondary | ICD-10-CM

## 2015-01-01 MED ORDER — DIAZEPAM 5 MG PO TABS
10.0000 mg | ORAL_TABLET | Freq: Once | ORAL | Status: AC
  Administered 2015-01-01: 10 mg via ORAL

## 2015-01-01 MED ORDER — IOHEXOL 180 MG/ML  SOLN
15.0000 mL | Freq: Once | INTRAMUSCULAR | Status: AC | PRN
  Administered 2015-01-01: 15 mL via INTRATHECAL

## 2015-01-01 NOTE — Progress Notes (Signed)
Pt took Benadryl 50 mg po as a premed.

## 2015-01-01 NOTE — Discharge Instructions (Signed)
Myelogram Discharge Instructions  1. Go home and rest quietly for the next 24 hours.  It is important to lie flat for the next 24 hours.  Get up only to go to the restroom.  You may lie in the bed or on a couch on your back, your stomach, your left side or your right side.  You may have one pillow under your head.  You may have pillows between your knees while you are on your side or under your knees while you are on your back.  2. DO NOT drive today.  Recline the seat as far back as it will go, while still wearing your seat belt, on the way home.  3. You may get up to go to the bathroom as needed.  You may sit up for 10 minutes to eat.  You may resume your normal diet and medications unless otherwise indicated.  Drink lots of extra fluids today and tomorrow.  4. The incidence of headache, nausea, or vomiting is about 5% (one in 20 patients).  If you develop a headache, lie flat and drink plenty of fluids until the headache goes away.  Caffeinated beverages may be helpful.  If you develop severe nausea and vomiting or a headache that does not go away with flat bed rest, call [phone].  5. You may resume normal activities after your 24 hours of bed rest is over; however, do not exert yourself strongly or do any heavy lifting tomorrow. If when you get up you have a headache when standing, go back to bed and force fluids for another 24 hours.  6. Call your physician for a follow-up appointment.  The results of your myelogram will be sent directly to your physician by the following day.  7. If you have any questions or if complications develop after you arrive home, please call [phone].  Discharge instructions have been explained to the patient.  The patient, or the person responsible for the patient, fully understands these instructions.       May resume Tramadol on Aug. 2, 2016, after 9:30 am.

## 2015-01-17 ENCOUNTER — Ambulatory Visit: Payer: BLUE CROSS/BLUE SHIELD | Admitting: Internal Medicine

## 2015-01-30 ENCOUNTER — Other Ambulatory Visit: Payer: Self-pay | Admitting: Neurosurgery

## 2015-02-09 ENCOUNTER — Encounter (HOSPITAL_COMMUNITY): Payer: Self-pay

## 2015-02-09 ENCOUNTER — Encounter (HOSPITAL_COMMUNITY)
Admission: RE | Admit: 2015-02-09 | Discharge: 2015-02-09 | Disposition: A | Discharge status: Home / Self Care | Payer: BLUE CROSS/BLUE SHIELD | Source: Ambulatory Visit | Attending: Neurosurgery | Admitting: Neurosurgery

## 2015-02-09 DIAGNOSIS — Z01810 Encounter for preprocedural cardiovascular examination: Secondary | ICD-10-CM | POA: Diagnosis not present

## 2015-02-09 DIAGNOSIS — Z01812 Encounter for preprocedural laboratory examination: Secondary | ICD-10-CM | POA: Insufficient documentation

## 2015-02-09 HISTORY — DX: Unspecified osteoarthritis, unspecified site: M19.90

## 2015-02-09 HISTORY — DX: Other specified postprocedural states: Z98.890

## 2015-02-09 HISTORY — DX: Pneumonia, unspecified organism: J18.9

## 2015-02-09 HISTORY — DX: Other complications of anesthesia, initial encounter: T88.59XA

## 2015-02-09 HISTORY — DX: Unspecified glaucoma: H40.9

## 2015-02-09 HISTORY — DX: Type 2 diabetes mellitus without complications: E11.9

## 2015-02-09 HISTORY — DX: Other allergic rhinitis: J30.89

## 2015-02-09 HISTORY — DX: Nausea with vomiting, unspecified: R11.2

## 2015-02-09 HISTORY — DX: Bronchitis, not specified as acute or chronic: J40

## 2015-02-09 HISTORY — DX: Hyperlipidemia, unspecified: E78.5

## 2015-02-09 HISTORY — DX: Adverse effect of unspecified anesthetic, initial encounter: T41.45XA

## 2015-02-09 HISTORY — DX: Other disorders of optic nerve, not elsewhere classified, unspecified eye: H47.099

## 2015-02-09 HISTORY — DX: Gastro-esophageal reflux disease without esophagitis: K21.9

## 2015-02-09 HISTORY — DX: Essential (primary) hypertension: I10

## 2015-02-09 LAB — CBC
HCT: 37.5 % (ref 36.0–46.0)
HEMOGLOBIN: 12.2 g/dL (ref 12.0–15.0)
MCH: 32 pg (ref 26.0–34.0)
MCHC: 32.5 g/dL (ref 30.0–36.0)
MCV: 98.4 fL (ref 78.0–100.0)
Platelets: 411 10*3/uL — ABNORMAL HIGH (ref 150–400)
RBC: 3.81 MIL/uL — AB (ref 3.87–5.11)
RDW: 13.2 % (ref 11.5–15.5)
WBC: 9.4 10*3/uL (ref 4.0–10.5)

## 2015-02-09 LAB — BASIC METABOLIC PANEL
Anion gap: 9 (ref 5–15)
BUN: 17 mg/dL (ref 6–20)
CO2: 24 mmol/L (ref 22–32)
Calcium: 9.4 mg/dL (ref 8.9–10.3)
Chloride: 106 mmol/L (ref 101–111)
Creatinine, Ser: 1.09 mg/dL — ABNORMAL HIGH (ref 0.44–1.00)
GFR calc Af Amer: >60 mL/min (ref >60)
GFR calc non Af Amer: 54 mL/min — ABNORMAL LOW (ref >60)
GLUCOSE: 95 mg/dL (ref 65–99)
POTASSIUM: 4.1 mmol/L (ref 3.5–5.1)
Sodium: 139 mmol/L (ref 135–145)

## 2015-02-09 LAB — SURGICAL PCR SCREEN
MRSA, PCR: NEGATIVE
Staphylococcus aureus: NEGATIVE

## 2015-02-09 LAB — GLUCOSE, CAPILLARY: Glucose-Capillary: 99 mg/dL (ref 65–99)

## 2015-02-09 NOTE — Progress Notes (Signed)
   02/09/15 1016  OBSTRUCTIVE SLEEP APNEA  Have you ever been diagnosed with sleep apnea through a sleep study? No  Do you snore loudly (loud enough to be heard through closed doors)?  1  Do you often feel tired, fatigued, or sleepy during the daytime (such as falling asleep during driving or talking to someone)? 0  Has anyone observed you stop breathing during your sleep? 1  Do you have, or are you being treated for high blood pressure? 1  BMI more than 35 kg/m2? 1  Age > 50 (1-yes) 1  Neck circumference greater than:Female 16 inches or larger, Female 17inches or larger? 0  Female Gender (Yes=1) 0  Obstructive Sleep Apnea Score 5  Score 5 or greater  Results sent to PCP   This patient has screened at risk for sleep apnea using the STOP bang tool used during a pre-surgical visit. A score of 4 or greater is at risk for sleep apnea.

## 2015-02-09 NOTE — Pre-Procedure Instructions (Signed)
[name] [name]  02/09/2015     Your procedure is scheduled on : Wednesday February 14, 2015 at 2:36 PM.  Report to [facility] Admitting at 12:35 P.M.  Call this number if you have problems the morning of surgery: [phone]   Remember:  Do not eat food or drink liquids after midnight.  Take these medicines the morning of surgery with A SIP OF WATER : Cetirizine (Zyrtec), Nasal spray   Do NOT take any diabetic medications the morning of your surgery (NO Sitagliptin-Metformin)   Stop taking any vitamins, herbal medications, Ibuprofen, Advil, Motrin, Aleve, etc    How to Manage Your Diabetes Before Surgery   Why is it important to control my blood sugar before and after surgery?   Improving blood sugar levels before and after surgery helps healing and can limit problems.  A way of improving blood sugar control is eating a healthy diet by:  - Eating less sugar and carbohydrates  - Increasing activity/exercise  - Talk with your doctor about reaching your blood sugar goals  High blood sugars (greater than 180 mg/dL) can raise your risk of infections and slow down your recovery so you will need to focus on controlling your diabetes during the weeks before surgery.  Make sure that the doctor who takes care of your diabetes knows about your planned surgery including the date and location.  How do I manage my blood sugars before surgery?   Check your blood sugar at least 4 times a day, 2 days before surgery to make sure that they are not too high or low.   Check your blood sugar the morning of your surgery when you wake up and every 2 hours until you get to the Short-Stay unit.  If your blood sugar is less than 70 mg/dL, you will need to treat for low blood sugar by:  Treat a low blood sugar (less than 70 mg/dL) with 1/2 cup of clear juice (cranberry or apple), 4 glucose tablets, OR glucose gel.  Recheck blood sugar in 15 minutes after treatment (to make sure it is  greater than 70 mg/dL).  If blood sugar is not greater than 70 mg/dL on re-check, call [phone] for further instructions.   Report your blood sugar to the Short-Stay nurse when you get to Short-Stay.  References:  [facility], 2007 "How to Manage your Diabetes Before and After Surgery".  What do I do about my diabetes medications?   Do not take oral diabetes medicines (pills) the morning of surgery.    Do not wear jewelry, make-up or nail polish.  Do not wear lotions, powders, or perfumes.  You may wear deodorant.  Do not shave 48 hours prior to surgery.  Men may shave face and neck.  Do not bring valuables to the hospital.  [facility] is not responsible for any belongings or valuables.  Contacts, dentures or bridgework may not be worn into surgery.  Leave your suitcase in the car.  After surgery it may be brought to your room.  For patients admitted to the hospital, discharge time will be determined by your treatment team.  Patients discharged the day of surgery will not be allowed to drive home.   Name and phone number of your driver:     Special instructions:  Shower using CHG soap the night before and the morning of your surgery  Please read over the following fact sheets that you were given. Pain Booklet, Coughing and  Deep Breathing, MRSA Information and Surgical Site Infection Prevention

## 2015-02-09 NOTE — Progress Notes (Signed)
PCP is Lucianne Lei  Patient informed Nurse that she had a stress test a "long time ago" (> 5 years). Patient denied having any acute cardiac or pulmonary issues  Patient informed Nurse that she does not have a meter and does not check her blood sugar at home.

## 2015-02-10 LAB — HEMOGLOBIN A1C
Hgb A1c MFr Bld: 6 % — ABNORMAL HIGH (ref 4.8–5.6)
Mean Plasma Glucose: 126 mg/dL

## 2015-02-14 ENCOUNTER — Inpatient Hospital Stay (HOSPITAL_COMMUNITY): Payer: BLUE CROSS/BLUE SHIELD | Admitting: Anesthesiology

## 2015-02-14 ENCOUNTER — Inpatient Hospital Stay (HOSPITAL_COMMUNITY): Payer: BLUE CROSS/BLUE SHIELD

## 2015-02-14 ENCOUNTER — Inpatient Hospital Stay (HOSPITAL_COMMUNITY)
Admission: RE | Admit: 2015-02-14 | Discharge: 2015-02-17 | DRG: 460 | Disposition: A | Discharge status: Home / Self Care | Payer: BLUE CROSS/BLUE SHIELD | Source: Ambulatory Visit | Attending: Neurosurgery | Admitting: Neurosurgery

## 2015-02-14 ENCOUNTER — Encounter (HOSPITAL_COMMUNITY)
Admission: RE | Disposition: A | Discharge status: Home / Self Care | Payer: BLUE CROSS/BLUE SHIELD | Source: Ambulatory Visit | Attending: Neurosurgery

## 2015-02-14 ENCOUNTER — Encounter (HOSPITAL_COMMUNITY): Payer: Self-pay | Admitting: General Practice

## 2015-02-14 DIAGNOSIS — Z91040 Latex allergy status: Secondary | ICD-10-CM | POA: Diagnosis not present

## 2015-02-14 DIAGNOSIS — E785 Hyperlipidemia, unspecified: Secondary | ICD-10-CM | POA: Diagnosis present

## 2015-02-14 DIAGNOSIS — E119 Type 2 diabetes mellitus without complications: Secondary | ICD-10-CM | POA: Diagnosis present

## 2015-02-14 DIAGNOSIS — Z981 Arthrodesis status: Secondary | ICD-10-CM | POA: Diagnosis not present

## 2015-02-14 DIAGNOSIS — Z91018 Allergy to other foods: Secondary | ICD-10-CM

## 2015-02-14 DIAGNOSIS — M5126 Other intervertebral disc displacement, lumbar region: Secondary | ICD-10-CM | POA: Diagnosis present

## 2015-02-14 DIAGNOSIS — Z91041 Radiographic dye allergy status: Secondary | ICD-10-CM | POA: Diagnosis not present

## 2015-02-14 DIAGNOSIS — J302 Other seasonal allergic rhinitis: Secondary | ICD-10-CM | POA: Diagnosis present

## 2015-02-14 DIAGNOSIS — Z881 Allergy status to other antibiotic agents status: Secondary | ICD-10-CM | POA: Diagnosis not present

## 2015-02-14 DIAGNOSIS — Z885 Allergy status to narcotic agent status: Secondary | ICD-10-CM | POA: Diagnosis not present

## 2015-02-14 DIAGNOSIS — Z419 Encounter for procedure for purposes other than remedying health state, unspecified: Secondary | ICD-10-CM

## 2015-02-14 DIAGNOSIS — M19072 Primary osteoarthritis, left ankle and foot: Secondary | ICD-10-CM | POA: Diagnosis present

## 2015-02-14 DIAGNOSIS — Z7982 Long term (current) use of aspirin: Secondary | ICD-10-CM

## 2015-02-14 DIAGNOSIS — Z79899 Other long term (current) drug therapy: Secondary | ICD-10-CM

## 2015-02-14 DIAGNOSIS — K219 Gastro-esophageal reflux disease without esophagitis: Secondary | ICD-10-CM | POA: Diagnosis present

## 2015-02-14 DIAGNOSIS — M4806 Spinal stenosis, lumbar region: Secondary | ICD-10-CM | POA: Diagnosis present

## 2015-02-14 DIAGNOSIS — H409 Unspecified glaucoma: Secondary | ICD-10-CM | POA: Diagnosis present

## 2015-02-14 DIAGNOSIS — I10 Essential (primary) hypertension: Secondary | ICD-10-CM | POA: Diagnosis present

## 2015-02-14 LAB — GLUCOSE, CAPILLARY
Glucose-Capillary: 89 mg/dL (ref 65–99)
Glucose-Capillary: 130 mg/dL — ABNORMAL HIGH (ref 65–99)

## 2015-02-14 SURGERY — POSTERIOR LUMBAR FUSION 1 WITH HARDWARE REMOVAL
Anesthesia: General | Site: Back

## 2015-02-14 MED ORDER — IBUPROFEN 200 MG PO TABS: 800.0000 mg | ORAL_TABLET | Freq: Three times a day (TID) | ORAL | Status: DC | PRN

## 2015-02-14 MED ORDER — DIPHENHYDRAMINE HCL 12.5 MG/5ML PO ELIX: 12.5000 mg | ORAL_SOLUTION | Freq: Four times a day (QID) | ORAL | Status: DC | PRN

## 2015-02-14 MED ORDER — VITAMIN D (ERGOCALCIFEROL) 1.25 MG (50000 UNIT) PO CAPS
50000.0000 [IU] | ORAL_CAPSULE | ORAL | Status: DC
  Administered 2015-02-15: 50000 [IU] via ORAL
  Filled 2015-02-14: qty 1

## 2015-02-14 MED ORDER — ROCURONIUM BROMIDE 100 MG/10ML IV SOLN
INTRAVENOUS | Status: DC | PRN
  Administered 2015-02-14: 5 mg via INTRAVENOUS
  Administered 2015-02-14: 20 mg via INTRAVENOUS
  Administered 2015-02-14: 50 mg via INTRAVENOUS

## 2015-02-14 MED ORDER — SODIUM CHLORIDE 0.9 % IJ SOLN: 3.0000 mL | INTRAMUSCULAR | Status: DC | PRN

## 2015-02-14 MED ORDER — OXYCODONE-ACETAMINOPHEN 5-325 MG PO TABS
1.0000 | ORAL_TABLET | ORAL | Status: DC | PRN
  Administered 2015-02-15 – 2015-02-17 (×2): 2 via ORAL
  Filled 2015-02-14 (×2): qty 2

## 2015-02-14 MED ORDER — LACTATED RINGERS IV SOLN
INTRAVENOUS | Status: DC
  Administered 2015-02-14: 13:00:00 via INTRAVENOUS

## 2015-02-14 MED ORDER — VANCOMYCIN HCL 1000 MG IV SOLR
INTRAVENOUS | Status: DC | PRN
  Administered 2015-02-14: 1000 mg via TOPICAL

## 2015-02-14 MED ORDER — ONDANSETRON HCL 4 MG/2ML IJ SOLN
INTRAMUSCULAR | Status: DC | PRN
  Administered 2015-02-14: 4 mg via INTRAVENOUS

## 2015-02-14 MED ORDER — SODIUM CHLORIDE 0.9 % IJ SOLN
3.0000 mL | Freq: Two times a day (BID) | INTRAMUSCULAR | Status: DC
  Administered 2015-02-15 – 2015-02-17 (×4): 3 mL via INTRAVENOUS

## 2015-02-14 MED ORDER — LIDOCAINE-EPINEPHRINE 1 %-1:100000 IJ SOLN
INTRAMUSCULAR | Status: DC | PRN
  Administered 2015-02-14: 10 mL

## 2015-02-14 MED ORDER — ONDANSETRON HCL 4 MG/2ML IJ SOLN: 4.0000 mg | Freq: Four times a day (QID) | INTRAMUSCULAR | Status: DC | PRN

## 2015-02-14 MED ORDER — HYDROMORPHONE 0.3 MG/ML IV SOLN
INTRAVENOUS | Status: AC
  Filled 2015-02-14: qty 25

## 2015-02-14 MED ORDER — PROPOFOL 10 MG/ML IV BOLUS
INTRAVENOUS | Status: AC
  Filled 2015-02-14: qty 20

## 2015-02-14 MED ORDER — ACETAMINOPHEN 650 MG RE SUPP: 650.0000 mg | RECTAL | Status: DC | PRN

## 2015-02-14 MED ORDER — ACETAMINOPHEN 325 MG PO TABS: 650.0000 mg | ORAL_TABLET | ORAL | Status: DC | PRN

## 2015-02-14 MED ORDER — FENTANYL CITRATE (PF) 250 MCG/5ML IJ SOLN
INTRAMUSCULAR | Status: AC
  Filled 2015-02-14: qty 5

## 2015-02-14 MED ORDER — AMLODIPINE BESYLATE 5 MG PO TABS
5.0000 mg | ORAL_TABLET | Freq: Every day | ORAL | Status: DC
  Administered 2015-02-14 – 2015-02-16 (×2): 5 mg via ORAL
  Filled 2015-02-14 (×2): qty 1

## 2015-02-14 MED ORDER — SODIUM CHLORIDE 0.9 % IR SOLN
Status: DC | PRN
  Administered 2015-02-14: 15:00:00

## 2015-02-14 MED ORDER — NEOSTIGMINE METHYLSULFATE 10 MG/10ML IV SOLN
INTRAVENOUS | Status: DC | PRN
  Administered 2015-02-14: 3 mg via INTRAVENOUS

## 2015-02-14 MED ORDER — LIDOCAINE HCL (CARDIAC) 20 MG/ML IV SOLN
INTRAVENOUS | Status: DC | PRN
  Administered 2015-02-14: 100 mg via INTRAVENOUS

## 2015-02-14 MED ORDER — FLUTICASONE FUROATE 27.5 MCG/SPRAY NA SUSP: 2.0000 | Freq: Every day | NASAL | Status: DC

## 2015-02-14 MED ORDER — FENOFIBRATE 54 MG PO TABS
54.0000 mg | ORAL_TABLET | Freq: Every day | ORAL | Status: DC
  Administered 2015-02-14 – 2015-02-16 (×3): 54 mg via ORAL
  Filled 2015-02-14 (×4): qty 1

## 2015-02-14 MED ORDER — ADULT MULTIVITAMIN W/MINERALS CH
1.0000 | ORAL_TABLET | Freq: Every day | ORAL | Status: DC
  Administered 2015-02-14 – 2015-02-16 (×3): 1 via ORAL
  Filled 2015-02-14 (×6): qty 1

## 2015-02-14 MED ORDER — PHENYLEPHRINE HCL 10 MG/ML IJ SOLN
10.0000 mg | INTRAMUSCULAR | Status: DC | PRN
  Administered 2015-02-14: 10 ug/min via INTRAVENOUS

## 2015-02-14 MED ORDER — BUPIVACAINE LIPOSOME 1.3 % IJ SUSP
INTRAMUSCULAR | Status: DC | PRN
  Administered 2015-02-14: 20 mL

## 2015-02-14 MED ORDER — POTASSIUM CHLORIDE CRYS ER 20 MEQ PO TBCR
20.0000 meq | EXTENDED_RELEASE_TABLET | Freq: Every day | ORAL | Status: DC
  Administered 2015-02-14 – 2015-02-16 (×3): 20 meq via ORAL
  Filled 2015-02-14 (×4): qty 1

## 2015-02-14 MED ORDER — SURGIFOAM 100 EX MISC
CUTANEOUS | Status: DC | PRN
  Administered 2015-02-14: 15:00:00 via TOPICAL

## 2015-02-14 MED ORDER — SODIUM CHLORIDE 0.9 % IV SOLN
250.0000 mL | INTRAVENOUS | Status: DC
  Administered 2015-02-15: 250 mL via INTRAVENOUS

## 2015-02-14 MED ORDER — GLYCOPYRROLATE 0.2 MG/ML IJ SOLN
INTRAMUSCULAR | Status: DC | PRN
  Administered 2015-02-14: .4 mg via INTRAVENOUS

## 2015-02-14 MED ORDER — ROCURONIUM BROMIDE 50 MG/5ML IV SOLN
INTRAVENOUS | Status: AC
  Filled 2015-02-14: qty 1

## 2015-02-14 MED ORDER — ATORVASTATIN CALCIUM 40 MG PO TABS
40.0000 mg | ORAL_TABLET | Freq: Every day | ORAL | Status: DC
  Administered 2015-02-14 – 2015-02-16 (×3): 40 mg via ORAL
  Filled 2015-02-14 (×4): qty 1

## 2015-02-14 MED ORDER — LINAGLIPTIN 5 MG PO TABS
5.0000 mg | ORAL_TABLET | Freq: Every day | ORAL | Status: DC
  Administered 2015-02-14 – 2015-02-16 (×3): 5 mg via ORAL
  Filled 2015-02-14 (×3): qty 1

## 2015-02-14 MED ORDER — MIDAZOLAM HCL 2 MG/2ML IJ SOLN
INTRAMUSCULAR | Status: AC
  Filled 2015-02-14: qty 4

## 2015-02-14 MED ORDER — FENTANYL CITRATE (PF) 100 MCG/2ML IJ SOLN: 25.0000 ug | INTRAMUSCULAR | Status: DC | PRN

## 2015-02-14 MED ORDER — IRBESARTAN 150 MG PO TABS
150.0000 mg | ORAL_TABLET | Freq: Every day | ORAL | Status: DC
  Administered 2015-02-14 – 2015-02-16 (×2): 150 mg via ORAL
  Filled 2015-02-14 (×2): qty 1

## 2015-02-14 MED ORDER — 0.9 % SODIUM CHLORIDE (POUR BTL) OPTIME
TOPICAL | Status: DC | PRN
  Administered 2015-02-14: 1000 mL

## 2015-02-14 MED ORDER — CYCLOBENZAPRINE HCL 10 MG PO TABS
10.0000 mg | ORAL_TABLET | Freq: Three times a day (TID) | ORAL | Status: DC | PRN
  Administered 2015-02-15 – 2015-02-17 (×3): 10 mg via ORAL
  Filled 2015-02-14 (×3): qty 1

## 2015-02-14 MED ORDER — NALOXONE HCL 0.4 MG/ML IJ SOLN: 0.4000 mg | INTRAMUSCULAR | Status: DC | PRN

## 2015-02-14 MED ORDER — LATANOPROST 0.005 % OP SOLN
1.0000 [drp] | Freq: Every day | OPHTHALMIC | Status: DC
  Administered 2015-02-14 – 2015-02-16 (×4): 1 [drp] via OPHTHALMIC
  Filled 2015-02-14: qty 2.5

## 2015-02-14 MED ORDER — AMLODIPINE-VALSARTAN-HCTZ 5-160-12.5 MG PO TABS: 1.0000 | ORAL_TABLET | Freq: Every day | ORAL | Status: DC

## 2015-02-14 MED ORDER — PHENYLEPHRINE HCL 10 MG/ML IJ SOLN
INTRAMUSCULAR | Status: AC
  Filled 2015-02-14: qty 1

## 2015-02-14 MED ORDER — DIPHENHYDRAMINE HCL 50 MG/ML IJ SOLN: 12.5000 mg | Freq: Four times a day (QID) | INTRAMUSCULAR | Status: DC | PRN

## 2015-02-14 MED ORDER — FLUTICASONE PROPIONATE 50 MCG/ACT NA SUSP
2.0000 | Freq: Every day | NASAL | Status: DC
  Administered 2015-02-14 – 2015-02-16 (×2): 2 via NASAL
  Filled 2015-02-14: qty 16

## 2015-02-14 MED ORDER — TRAMADOL HCL 50 MG PO TABS: 50.0000 mg | ORAL_TABLET | Freq: Four times a day (QID) | ORAL | Status: DC | PRN

## 2015-02-14 MED ORDER — VANCOMYCIN HCL 1000 MG IV SOLR
INTRAVENOUS | Status: AC
  Filled 2015-02-14: qty 1000

## 2015-02-14 MED ORDER — CYCLOBENZAPRINE HCL ER 15 MG PO CP24: 15.0000 mg | ORAL_CAPSULE | Freq: Every day | ORAL | Status: DC | PRN

## 2015-02-14 MED ORDER — ESTRADIOL 1 MG PO TABS
1.0000 mg | ORAL_TABLET | Freq: Every day | ORAL | Status: DC
  Administered 2015-02-15 – 2015-02-17 (×3): 1 mg via ORAL
  Filled 2015-02-14 (×3): qty 1

## 2015-02-14 MED ORDER — PROPOFOL 10 MG/ML IV BOLUS
INTRAVENOUS | Status: DC | PRN
  Administered 2015-02-14: 20 mg via INTRAVENOUS
  Administered 2015-02-14: 150 mg via INTRAVENOUS

## 2015-02-14 MED ORDER — ASPIRIN 325 MG PO TABS
325.0000 mg | ORAL_TABLET | Freq: Every day | ORAL | Status: DC
  Administered 2015-02-14 – 2015-02-16 (×3): 325 mg via ORAL
  Filled 2015-02-14 (×4): qty 1

## 2015-02-14 MED ORDER — DOCUSATE SODIUM 100 MG PO CAPS
100.0000 mg | ORAL_CAPSULE | Freq: Two times a day (BID) | ORAL | Status: DC
  Administered 2015-02-14 – 2015-02-17 (×6): 100 mg via ORAL
  Filled 2015-02-14 (×6): qty 1

## 2015-02-14 MED ORDER — CALCIUM CHLORIDE 10 % IV SOLN
INTRAVENOUS | Status: DC | PRN
  Administered 2015-02-14 (×2): 100 mg via INTRAVENOUS

## 2015-02-14 MED ORDER — DEXAMETHASONE SODIUM PHOSPHATE 10 MG/ML IJ SOLN
10.0000 mg | INTRAMUSCULAR | Status: AC
  Administered 2015-02-14: 10 mg via INTRAVENOUS
  Filled 2015-02-14: qty 1

## 2015-02-14 MED ORDER — CEFAZOLIN SODIUM 1-5 GM-% IV SOLN
1.0000 g | Freq: Three times a day (TID) | INTRAVENOUS | Status: AC
  Administered 2015-02-14 – 2015-02-15 (×2): 1 g via INTRAVENOUS
  Filled 2015-02-14 (×2): qty 50

## 2015-02-14 MED ORDER — PHENYLEPHRINE HCL 10 MG/ML IJ SOLN
INTRAMUSCULAR | Status: DC | PRN
  Administered 2015-02-14 (×2): 80 ug via INTRAVENOUS

## 2015-02-14 MED ORDER — ALBUMIN HUMAN 5 % IV SOLN
INTRAVENOUS | Status: DC | PRN
  Administered 2015-02-14: 17:00:00 via INTRAVENOUS

## 2015-02-14 MED ORDER — OLOPATADINE HCL 0.1 % OP SOLN
1.0000 [drp] | Freq: Every day | OPHTHALMIC | Status: DC
  Administered 2015-02-14 – 2015-02-16 (×2): 1 [drp] via OPHTHALMIC
  Filled 2015-02-14: qty 5

## 2015-02-14 MED ORDER — VANCOMYCIN HCL IN DEXTROSE 1-5 GM/200ML-% IV SOLN: 1000.0000 mg | INTRAVENOUS | Status: DC

## 2015-02-14 MED ORDER — MIDAZOLAM HCL 5 MG/5ML IJ SOLN
INTRAMUSCULAR | Status: DC | PRN
  Administered 2015-02-14: 2 mg via INTRAVENOUS

## 2015-02-14 MED ORDER — PROMETHAZINE HCL 25 MG/ML IJ SOLN: 6.2500 mg | INTRAMUSCULAR | Status: DC | PRN

## 2015-02-14 MED ORDER — PHENOL 1.4 % MT LIQD: 1.0000 | OROMUCOSAL | Status: DC | PRN

## 2015-02-14 MED ORDER — MONTELUKAST SODIUM 10 MG PO TABS
10.0000 mg | ORAL_TABLET | Freq: Every day | ORAL | Status: DC
  Administered 2015-02-14 – 2015-02-16 (×3): 10 mg via ORAL
  Filled 2015-02-14 (×3): qty 1

## 2015-02-14 MED ORDER — SITAGLIP PHOS-METFORMIN HCL ER 100-1000 MG PO TB24: 1.0000 | ORAL_TABLET | Freq: Every day | ORAL | Status: DC

## 2015-02-14 MED ORDER — FENTANYL CITRATE (PF) 100 MCG/2ML IJ SOLN
INTRAMUSCULAR | Status: DC | PRN
Start: 2015-02-14 — End: 2015-02-14
  Administered 2015-02-14: 50 ug via INTRAVENOUS
  Administered 2015-02-14: 150 ug via INTRAVENOUS
  Administered 2015-02-14: 100 ug via INTRAVENOUS
  Administered 2015-02-14 (×3): 50 ug via INTRAVENOUS

## 2015-02-14 MED ORDER — HYDROMORPHONE 0.3 MG/ML IV SOLN
INTRAVENOUS | Status: DC
  Administered 2015-02-14 (×2): 0.3 mg via INTRAVENOUS
  Administered 2015-02-15: 0.9 mg via INTRAVENOUS
  Administered 2015-02-15: 1.2 mg via INTRAVENOUS
  Administered 2015-02-15: 0.6 mg via INTRAVENOUS
  Administered 2015-02-16: 0.9 mg via INTRAVENOUS
  Administered 2015-02-16: 0.823 mg via INTRAVENOUS
  Administered 2015-02-16: 1.5 mg via INTRAVENOUS
  Filled 2015-02-14: qty 25

## 2015-02-14 MED ORDER — LORATADINE 10 MG PO TABS
10.0000 mg | ORAL_TABLET | Freq: Every day | ORAL | Status: DC
  Administered 2015-02-14 – 2015-02-16 (×3): 10 mg via ORAL
  Filled 2015-02-14 (×4): qty 1

## 2015-02-14 MED ORDER — LACTATED RINGERS IV SOLN
INTRAVENOUS | Status: DC | PRN
  Administered 2015-02-14 (×3): via INTRAVENOUS

## 2015-02-14 MED ORDER — MENTHOL 3 MG MT LOZG: 1.0000 | LOZENGE | OROMUCOSAL | Status: DC | PRN

## 2015-02-14 MED ORDER — CALCIUM CHLORIDE 10 % IV SOLN
INTRAVENOUS | Status: AC
  Filled 2015-02-14: qty 10

## 2015-02-14 MED ORDER — METFORMIN HCL 500 MG PO TABS
1000.0000 mg | ORAL_TABLET | Freq: Every day | ORAL | Status: DC
  Administered 2015-02-14 – 2015-02-16 (×3): 1000 mg via ORAL
  Filled 2015-02-14 (×3): qty 2

## 2015-02-14 MED ORDER — BUPIVACAINE LIPOSOME 1.3 % IJ SUSP
20.0000 mL | INTRAMUSCULAR | Status: AC
  Filled 2015-02-14: qty 20

## 2015-02-14 MED ORDER — SODIUM CHLORIDE 0.9 % IJ SOLN: 9.0000 mL | INTRAMUSCULAR | Status: DC | PRN

## 2015-02-14 MED ORDER — ARTIFICIAL TEARS OP OINT
TOPICAL_OINTMENT | OPHTHALMIC | Status: DC | PRN
  Administered 2015-02-14: 1 via OPHTHALMIC

## 2015-02-14 MED ORDER — ONDANSETRON HCL 4 MG/2ML IJ SOLN: 4.0000 mg | INTRAMUSCULAR | Status: DC | PRN

## 2015-02-14 MED ORDER — VANCOMYCIN HCL IN DEXTROSE 1-5 GM/200ML-% IV SOLN
INTRAVENOUS | Status: AC
  Administered 2015-02-14: 1000 mg via INTRAVENOUS
  Filled 2015-02-14: qty 200

## 2015-02-14 SURGICAL SUPPLY — 68 items
APL SKNCLS STERI-STRIP NONHPOA (GAUZE/BANDAGES/DRESSINGS) ×1
BAG DECANTER FOR FLEXI CONT (MISCELLANEOUS) ×3 IMPLANT
BENZOIN TINCTURE PRP APPL 2/3 (GAUZE/BANDAGES/DRESSINGS) ×3 IMPLANT
BLADE CLIPPER SURG (BLADE) IMPLANT
BLADE SURG 11 STRL SS (BLADE) ×3 IMPLANT
BONE ALLOSTEM MORSELIZED 5CC (Bone Implant) ×3 IMPLANT
BRUSH SCRUB EZ PLAIN DRY (MISCELLANEOUS) ×3 IMPLANT
BUR MATCHSTICK NEURO 3.0 LAGG (BURR) ×3 IMPLANT
BUR PRECISION FLUTE 6.0 (BURR) ×3 IMPLANT
CAGE RISE 11-17-15 10X22 (Cage) ×6 IMPLANT
CANISTER SUCT 3000ML PPV (MISCELLANEOUS) ×3 IMPLANT
CLOSURE WOUND 1/2 X4 (GAUZE/BANDAGES/DRESSINGS) ×1
CONT SPEC 4OZ CLIKSEAL STRL BL (MISCELLANEOUS) ×6 IMPLANT
COVER BACK TABLE 24X17X13 BIG (DRAPES) IMPLANT
COVER BACK TABLE 60X90IN (DRAPES) ×3 IMPLANT
COVER MAYO STAND STRL (DRAPES) ×3 IMPLANT
DECANTER SPIKE VIAL GLASS SM (MISCELLANEOUS) ×3 IMPLANT
DRAPE C-ARM 42X72 X-RAY (DRAPES) ×3 IMPLANT
DRAPE C-ARMOR (DRAPES) ×3 IMPLANT
DRAPE LAPAROTOMY 100X72X124 (DRAPES) ×3 IMPLANT
DRAPE POUCH INSTRU U-SHP 10X18 (DRAPES) ×3 IMPLANT
DRAPE PROXIMA HALF (DRAPES) IMPLANT
DRAPE SURG 17X23 STRL (DRAPES) ×3 IMPLANT
DRSG OPSITE 4X5.5 SM (GAUZE/BANDAGES/DRESSINGS) ×3 IMPLANT
DRSG OPSITE POSTOP 3X4 (GAUZE/BANDAGES/DRESSINGS) ×3 IMPLANT
DURAPREP 26ML APPLICATOR (WOUND CARE) ×3 IMPLANT
ELECT REM PT RETURN 9FT ADLT (ELECTROSURGICAL) ×3
ELECTRODE REM PT RTRN 9FT ADLT (ELECTROSURGICAL) ×1 IMPLANT
EVACUATOR 3/16  PVC DRAIN (DRAIN) ×2
EVACUATOR 3/16 PVC DRAIN (DRAIN) ×1 IMPLANT
GAUZE SPONGE 4X4 12PLY STRL (GAUZE/BANDAGES/DRESSINGS) ×3 IMPLANT
GAUZE SPONGE 4X4 16PLY XRAY LF (GAUZE/BANDAGES/DRESSINGS) IMPLANT
GLOVE BIO SURGEON STRL SZ8 (GLOVE) IMPLANT
GLOVE ECLIPSE 7.5 STRL STRAW (GLOVE) IMPLANT
GLOVE INDICATOR 8.5 STRL (GLOVE) ×6 IMPLANT
GLOVE SURG SS PI 7.5 STRL IVOR (GLOVE) ×12 IMPLANT
GOWN STRL REUS W/ TWL LRG LVL3 (GOWN DISPOSABLE) ×2 IMPLANT
GOWN STRL REUS W/ TWL XL LVL3 (GOWN DISPOSABLE) ×2 IMPLANT
GOWN STRL REUS W/TWL 2XL LVL3 (GOWN DISPOSABLE) ×3 IMPLANT
GOWN STRL REUS W/TWL LRG LVL3 (GOWN DISPOSABLE) ×4
GOWN STRL REUS W/TWL XL LVL3 (GOWN DISPOSABLE) ×6
KIT BASIN OR (CUSTOM PROCEDURE TRAY) ×3 IMPLANT
KIT ROOM TURNOVER OR (KITS) ×3 IMPLANT
LIQUID BAND (GAUZE/BANDAGES/DRESSINGS) ×3 IMPLANT
MILL MEDIUM DISP (BLADE) ×3 IMPLANT
NEEDLE HYPO 21X1.5 SAFETY (NEEDLE) ×6 IMPLANT
NEEDLE HYPO 25X1 1.5 SAFETY (NEEDLE) ×3 IMPLANT
NS IRRIG 1000ML POUR BTL (IV SOLUTION) ×3 IMPLANT
PACK LAMINECTOMY NEURO (CUSTOM PROCEDURE TRAY) ×3 IMPLANT
PAD ARMBOARD 7.5X6 YLW CONV (MISCELLANEOUS) ×9 IMPLANT
ROD 50MM (Rod) ×4 IMPLANT
ROD SPNL CVD 50X4.75X (Rod) ×2 IMPLANT
SCREW MAS 6.5X45 (Screw) ×6 IMPLANT
SCREW SET SOLERA (Screw) ×8 IMPLANT
SCREW SET SOLERA TI (Screw) ×4 IMPLANT
SPONGE LAP 4X18 X RAY DECT (DISPOSABLE) IMPLANT
SPONGE SURGIFOAM ABS GEL 100 (HEMOSTASIS) ×3 IMPLANT
STRIP CLOSURE SKIN 1/2X4 (GAUZE/BANDAGES/DRESSINGS) ×2 IMPLANT
SUT BONE WAX W31G (SUTURE) ×3 IMPLANT
SUT VIC AB 0 CT1 18XCR BRD8 (SUTURE) ×2 IMPLANT
SUT VIC AB 0 CT1 8-18 (SUTURE) ×6
SUT VIC AB 2-0 CT1 18 (SUTURE) ×3 IMPLANT
SUT VICRYL 4-0 PS2 18IN ABS (SUTURE) ×3 IMPLANT
SYR 20CC LL (SYRINGE) ×6 IMPLANT
TOWEL OR 17X24 6PK STRL BLUE (TOWEL DISPOSABLE) ×3 IMPLANT
TOWEL OR 17X26 10 PK STRL BLUE (TOWEL DISPOSABLE) ×3 IMPLANT
TRAY FOLEY W/METER SILVER 14FR (SET/KITS/TRAYS/PACK) ×3 IMPLANT
WATER STERILE IRR 1000ML POUR (IV SOLUTION) ×3 IMPLANT

## 2015-02-14 NOTE — H&P (Signed)
[name] [name] is an 59 y.o. female.   Chief Complaint: Back and bilateral leg pain HPI: Patient is a very pleasant 59 year old female who previously undergone L4-5 left lumbar fusion done very well as a progress worsening back and bilateral leg pain following an L3 and L4 nerve root pattern workup revealed progressive degeneration breakdown disc herniation instability L3-4 above her previous fusion. It also showed a solid fusion L4-5. Due to her failure conservative treatment imaging findings and progressive clinical syndrome I recommended decompressive position procedure at L4 34. As well as and exploration of fusion removal of hardware L4-5. I've extensively gone over the risks and benefits of the operation with the patient as well as perioperative course expectations of outcome and alternatives surgery and she understands and agrees to proceed forward.  Past Medical History  Diagnosis Date  . Complication of anesthesia     Patient woke up during bunionectomy and breast cyst removal surgery  . PONV (postoperative nausea and vomiting)     Slight nausea  . Hypertension   . Bronchitis   . Pneumonia 2014  . Diabetes mellitus without complication   . GERD (gastroesophageal reflux disease)   . Arthritis     left foot  . Environmental and seasonal allergies   . Hyperlipemia   . Optic nerve disorder     left eye  . Glaucoma     Bilateral    Past Surgical History  Procedure Laterality Date  . Abdominal hysterectomy    . Bunionectomy Bilateral   . Breast cyst excision Bilateral   . Finger surgery Left     Thumb  . Back surgery      X 3  . Colonoscopy      History reviewed. No pertinent family history. Social History:  reports that she has never smoked. She has never used smokeless tobacco. She reports that she drinks alcohol. She reports that she does not use illicit drugs.  Allergies:  Allergies  Allergen Reactions  . Codeine Nausea And Vomiting  . Erythromycin Itching and  Swelling  . Amoxicillin Diarrhea  . Other     FOOD ALLERGIES MSG  Green peas Mustard Peanuts Corn sesame Yellow onions RED DYE NO PERFUMES  . Adhesive [Tape] Dermatitis  . Amoxicillin-Pot Clavulanate Diarrhea    Per discussion 07/26/2012, pt tolerates Omnicef well.  . Iodinated Diagnostic Agents Itching    Tolerates iodinated contrast with Benadryl 50mg  PO prior to administration.  jkl  . Latex Dermatitis    Medications Prior to Admission  Medication Sig Dispense Refill  . Alum Hydroxide-Mag Carbonate (GAVISCON EXTRA STRENGTH PO) Take 15 mLs by mouth daily as needed.    . Amlodipine-Valsartan-HCTZ 5-160-12.5 MG TABS Take 1 tablet by mouth at bedtime.    Marland Kitchen aspirin 325 MG tablet Take 325 mg by mouth daily.    Marland Kitchen atorvastatin (LIPITOR) 40 MG tablet Take 40 mg by mouth daily.    . Calcium Carbonate-Vitamin D (CALCIUM-VITAMIN D) 500-200 MG-UNIT per tablet Take 1 tablet by mouth.    . cetirizine (ZYRTEC) 10 MG tablet Take 10 mg by mouth daily.    . cyclobenzaprine (AMRIX) 15 MG 24 hr capsule Take 15 mg by mouth daily as needed for muscle spasms.    Marland Kitchen estradiol (ESTRACE) 1 MG tablet Take 1 mg by mouth daily.    . fenofibrate (TRICOR) 145 MG tablet Take 145 mg by mouth daily.    . fluticasone (VERAMYST) 27.5 MCG/SPRAY nasal spray Place 2 sprays into the nose daily.    Marland Kitchen  ibuprofen (ADVIL,MOTRIN) 800 MG tablet Take 800 mg by mouth every 8 (eight) hours as needed for moderate pain.    Marland Kitchen latanoprost (XALATAN) 0.005 % ophthalmic solution Place 1 drop into both eyes at bedtime.    . montelukast (SINGULAIR) 10 MG tablet Take 10 mg by mouth at bedtime.    . Multiple Vitamins-Minerals (MULTIVITAMIN WITH MINERALS) tablet Take 1 tablet by mouth daily.    Marland Kitchen olopatadine (PATANOL) 0.1 % ophthalmic solution Place 1 drop into both eyes daily.     . potassium chloride SA (K-DUR,KLOR-CON) 20 MEQ tablet Take 20 mEq by mouth daily.    . SitaGLIPtin-MetFORMIN HCl 541 311 3736 MG TB24 Take 1 tablet by mouth at  bedtime.    . traMADol (ULTRAM) 50 MG tablet Take 50 mg by mouth every 6 (six) hours as needed for pain.    . Vitamin D, Ergocalciferol, (DRISDOL) 50000 UNITS CAPS capsule Take 50,000 Units by mouth 2 (two) times a week. Monday and Sunday    . EPINEPHRINE, ANAPHYLAXIS THERAPY AGENTS, Inject 0.3 mg into the muscle.      Results for orders placed or performed during the hospital encounter of 02/14/15 (from the past 48 hour(s))  Glucose, capillary     Status: None   Collection Time: 02/14/15 11:56 AM  Result Value Ref Range   Glucose-Capillary 89 65 - 99 mg/dL   No results found.  Review of Systems  Constitutional: Negative.   HENT: Negative.   Eyes: Negative.   Respiratory: Negative.   Cardiovascular: Negative.   Gastrointestinal: Negative.   Genitourinary: Negative.   Musculoskeletal: Positive for myalgias and back pain.  Skin: Negative.   Neurological: Positive for tingling and sensory change.  Psychiatric/Behavioral: Negative.     Blood pressure 121/70, pulse 95, temperature 98.5 F (36.9 C), temperature source Oral, resp. rate 20, height 5\' 5"  (1.651 m), weight 95.709 kg (211 lb), SpO2 100 %. Physical Exam  Constitutional: She is oriented to person, place, and time.  HENT:  Head: Normocephalic and atraumatic.  Eyes: Pupils are equal, round, and reactive to light.  Neck: Normal range of motion.  GI: Soft. Bowel sounds are normal.  Neurological: She is alert and oriented to person, place, and time.  Strength is 5 out of 5 in her iliopsoas, quads, hamstring, gastric, and tibialis, EHL.  Skin: Skin is warm and dry.     Assessment/Plan 59 year female presents for an L3-4 fusion.  [name] [name] P 02/14/2015, 2:10 PM

## 2015-02-14 NOTE — Transfer of Care (Signed)
Immediate Anesthesia Transfer of Care Note  Patient: [name] [name]  Procedure(s) Performed: Procedure(s): Posterior Lumbar Interbody Fusion Lumbar Three, Lumbar Four Removal Lumbar Four- Five (N/A)  Patient Location: PACU  Anesthesia Type:General  Level of Consciousness: awake, sedated and patient cooperative  Airway & Oxygen Therapy: Patient Spontanous Breathing and Patient connected to face mask oxygen  Post-op Assessment: Report given to RN, Post -op Vital signs reviewed and stable and Patient moving all extremities  Post vital signs: Reviewed and stable  Last Vitals:  Filed Vitals:   02/14/15 1159  BP: 121/70  Pulse: 95  Temp: 36.9 C  Resp: 20    Complications: No apparent anesthesia complications

## 2015-02-14 NOTE — Anesthesia Procedure Notes (Signed)
Procedure Name: Intubation Date/Time: 02/14/2015 2:19 PM Performed by: Izora Gala Pre-anesthesia Checklist: Patient identified, Emergency Drugs available, Suction available and Patient being monitored Patient Re-evaluated:Patient Re-evaluated prior to inductionOxygen Delivery Method: Circle system utilized Preoxygenation: Pre-oxygenation with 100% oxygen Intubation Type: IV induction Ventilation: Mask ventilation without difficulty Laryngoscope Size: Miller and 3 Grade View: Grade I Tube type: Oral Tube size: 7.0 mm Airway Equipment and Method: Stylet and LTA kit utilized Placement Confirmation: ETT inserted through vocal cords under direct vision,  positive ETCO2 and breath sounds checked- equal and bilateral Secured at: 21 cm Tube secured with: Tape Dental Injury: Teeth and Oropharynx as per pre-operative assessment

## 2015-02-14 NOTE — Op Note (Signed)
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  #4 posterior lateral arthrodesis L3-4 using the local autograft mixed with allostem morsels  #4 exploration of fusion removal of hardware L4-5 with removal of bilateral L5 screws  Surgeon: Dominica Severin Yesena Reaves  Asst.: Marland Kitchen Ditty  Anesthesia: Gen.  EBL: Minimal  History of present illness: Patient is a very pleasant 59 year old female is a progress worsening back and predominantly left leg pain workup revealed lumbar spondylosis severe stenosis and a large herniated nucleus pulposis at L3-4 displaced the left L4 nerve root against the pedicle at the level of disc space and slightly below. Due to patient's failure conservative treatment imaging findings and progression of clinical syndrome I recommended an exploration of fusion removal of hardware at L4-5 and a decompression stabilization procedure at L3-4. I extensively went over the risks and benefits of the operation with the patient as well as perioperative course expectations of outcome and alternatives of surgery and she understood and agreed to proceed forward.  Operative procedure  Patient brought into the or was induced under general anesthesia positioned prone the Eisel frame her back was prepped and draped in routine sterile fashion. Her old incision was opened up and extended slightly cephalad subperiosteal dissections care lamina of L L3 down to the hardware exposing  the hardware at L4-5 the fusion didn't appear to be solid disconnected the cross-link remove the rods and the knots removed the L5 screws. The L4-5 fusion was solid. At this point at removed the spinous process at L3 to central decompression was marked scar tissue in the inferior aspect decompression site I performed complete needle facetectomies radical foraminotomies of the L3 and L4 nerve root to expose lateral aspect the disc space. Epidural veins were quite a disc space was incised on both sides sequential distraction was used with an 11 distractor in place I cleaned out a lot of disc from the patient's right side prepare the endplates and inserted a tendon 17 expandable cage on the right expanded naproxen 5 turns. Then working on the left side cleaned out several large fragments of ruptured disc from medial to the L4 nerve root inferior the disc space and at the level pedicle. The thecal sac was widely decompressed this point I continued clean out the disc space and prepare the endplates. Local autograft mixed was packed centrally contralateral cage was inserted and expanded and a similar fashion. Then pedicle screws placed at L3 all screws excellent purchase then postop fluoroscopy confirmed good position of all the implants was in to see her good fixing space was maintained aggressive decortication was care MTPs lateral gutters L3 for the remainder the local autograft mixed was packed posterior laterally then the rods were placed top tightness tightened down and anchored in place. The foraminal reinspected to confirm no migration of graft material large Hemovac drain was placed the wounds closed in layers with Vicryls using utilizing vancomycin powder both subfascially and extrafascially. I injected X Burrell and the fascia closed the subcutaneous tissues and subcuticular on the skin. Dermabond benzo and  Steri-Strips were applied patient recovered in stable condition. At the end of case all needle counts  sponge counts were correct.

## 2015-02-14 NOTE — Anesthesia Postprocedure Evaluation (Signed)
  Anesthesia Post-op Note  Patient: [name] [name]  Procedure(s) Performed: Procedure(s): Posterior Lumbar Interbody Fusion Lumbar Three, Lumbar Four Removal Lumbar Four- Five (N/A)  Patient Location: PACU  Anesthesia Type: General   Level of Consciousness: awake, alert  and oriented  Airway and Oxygen Therapy: Patient Spontanous Breathing  Post-op Pain: moderate  Post-op Assessment: Post-op Vital signs reviewed  Post-op Vital Signs: Reviewed  Last Vitals:  Filed Vitals:   02/14/15 1850  BP: 124/72  Pulse: 100  Temp: 36.8 C  Resp: 15    Complications: No apparent anesthesia complications

## 2015-02-14 NOTE — Anesthesia Preprocedure Evaluation (Addendum)
Anesthesia Evaluation  Patient identified by MRN, date of birth, ID band Patient awake    Reviewed: Allergy & Precautions, NPO status , Patient's Chart, lab work & pertinent test results  History of Anesthesia Complications (+) PONV and history of anesthetic complications  Airway Mallampati: III  TM Distance: >3 FB Neck ROM: Full    Dental  (+) Teeth Intact, Dental Advisory Given   Pulmonary neg pulmonary ROS,    Pulmonary exam normal breath sounds clear to auscultation       Cardiovascular Exercise Tolerance: Good hypertension, Pt. on medications (-) angina(-) Past MI Normal cardiovascular exam Rhythm:Regular Rate:Normal     Neuro/Psych  Headaches,    GI/Hepatic Neg liver ROS, GERD  Medicated,  Endo/Other  diabetes, Type 2Obesity   Renal/GU negative Renal ROS     Musculoskeletal  (+) Arthritis , Osteoarthritis,    Abdominal   Peds  Hematology negative hematology ROS (+)   Anesthesia Other Findings Day of surgery medications reviewed with the patient.  Reproductive/Obstetrics                          Anesthesia Physical Anesthesia Plan  ASA: II  Anesthesia Plan: General   Post-op Pain Management:    Induction: Intravenous  Airway Management Planned: Oral ETT  Additional Equipment:   Intra-op Plan:   Post-operative Plan: Extubation in OR  Informed Consent: I have reviewed the patients History and Physical, chart, labs and discussed the procedure including the risks, benefits and alternatives for the proposed anesthesia with the patient or authorized representative who has indicated his/her understanding and acceptance.   Dental advisory given  Plan Discussed with: CRNA  Anesthesia Plan Comments: (Risks/benefits of general anesthesia discussed with patient including risk of damage to teeth, lips, gum, and tongue, nausea/vomiting, allergic reactions to medications, and the  possibility of heart attack, stroke and death.  All patient questions answered.  Patient wishes to proceed.)        Anesthesia Quick Evaluation

## 2015-02-15 MED FILL — Heparin Sodium (Porcine) Inj 1000 Unit/ML: INTRAMUSCULAR | Qty: 30 | Status: AC

## 2015-02-15 MED FILL — Sodium Chloride IV Soln 0.9%: INTRAVENOUS | Qty: 1000 | Status: AC

## 2015-02-15 NOTE — Progress Notes (Signed)
Occupational Therapy Evaluation Patient Details Name: [name] [name] MRN: [id number] DOB: 1956-04-09 Today's Date: 02/15/2015    History of Present Illness s/p posterior lumbar interbody fusion L3-4 with hardware removal; previously undergone L4-5 left lumbar fusion    Clinical Impression   Pt admitted with the above diagnoses and presents with below problem list. Pt will benefit from continued acute OT to address the below listed deficits and maximize independence with BADLs prior to d/c home. PTA pt was independent with ADLs. Pt is currently min guard to min A with LB ADLs. Has family assisting at d/c. Session details below. OT to continue to follow acutely.      Follow Up Recommendations  Supervision/Assistance - 24 hour;No OT follow up    Equipment Recommendations  None recommended by OT    Recommendations for Other Services PT consult     Precautions / Restrictions Precautions Precautions: Back Precaution Booklet Issued: Yes (comment) Precaution Comments: educated on BAT precautions Required Braces or Orthoses: Spinal Brace Spinal Brace: Lumbar corset;Applied in sitting position Restrictions Weight Bearing Restrictions: No      Mobility Bed Mobility Overal bed mobility: Needs Assistance Bed Mobility: Rolling;Sidelying to Sit;Sit to Sidelying Rolling: Min guard Sidelying to sit: Min guard     Sit to sidelying: Min guard General bed mobility comments: HOB flat, cues for technique. Min guard for safety.  Transfers Overall transfer level: Needs assistance Equipment used: Rolling walker (2 wheeled) Transfers: Sit to/from Stand Sit to Stand: Min guard         General transfer comment: from EOB, min guard for safety    Balance Overall balance assessment: Needs assistance Sitting-balance support: Feet supported Sitting balance-Leahy Scale: Good     Standing balance support: Bilateral upper extremity supported;During functional activity Standing  balance-Leahy Scale: Fair Standing balance comment: external support for balance, comfort                            ADL Overall ADL's : Needs assistance/impaired Eating/Feeding: Set up;Sitting   Grooming: Set up;Min guard;Sitting;Standing   Upper Body Bathing: Min guard;Sitting;Cueing for compensatory techniques;With adaptive equipment   Lower Body Bathing: Min guard;With adaptive equipment;Sit to/from stand Lower Body Bathing Details (indicate cue type and reason): difficulty accessing feet in sitting position, educated on AE Upper Body Dressing : Min guard;Cueing for compensatory techniques;Sitting   Lower Body Dressing: Min guard;Sit to/from stand;With adaptive equipment Lower Body Dressing Details (indicate cue type and reason): difficulty accessing feet in sitting position, educated on AE Toilet Transfer: Min guard;Ambulation;BSC;RW   Toileting- Clothing Manipulation and Hygiene: Minimal assistance;Sit to/from stand Toileting - Clothing Manipulation Details (indicate cue type and reason): educated on AE and technique Tub/ Shower Transfer: Min guard;Walk-in shower;Ambulation;Shower seat;Rolling walker   Functional mobility during ADLs: Min guard;Rolling walker General ADL Comments: Pt completed in-room functional mobility and bed mobility. Educated on AE and compensatory strategies for ADLs with back precautions.     Vision     Perception     Praxis      Pertinent Vitals/Pain Pain Assessment: 0-10 Pain Score: 7  Pain Location: back at surgical site Pain Descriptors / Indicators: Sore Pain Intervention(s): Limited activity within patient's tolerance;Monitored during session;Repositioned     Hand Dominance Right   Extremity/Trunk Assessment Upper Extremity Assessment Upper Extremity Assessment: Overall WFL for tasks assessed   Lower Extremity Assessment Lower Extremity Assessment: Defer to PT evaluation   Cervical / Trunk Assessment Cervical / Trunk  Assessment:  (  s/p back surgery)   Communication Communication Communication: No difficulties   Cognition Arousal/Alertness: Awake/alert Behavior During Therapy: WFL for tasks assessed/performed Overall Cognitive Status: Within Functional Limits for tasks assessed                     General Comments    on RA during in-room functional mobility with O2 reading at 98.    Exercises       Shoulder Instructions      Home Living Family/patient expects to be discharged to:: Private residence Living Arrangements: Spouse/significant other Available Help at Discharge: Family;Available 24 hours/day Type of Home: House Home Access: Stairs to enter [employer] of Steps: 5 Entrance Stairs-Rails: Right;Left;Can reach both Home Layout: Two level;Bed/bath upstairs Alternate Level Stairs-Number of Steps: full flight   Bathroom Shower/Tub: Occupational psychologist: Standard     Home Equipment: Environmental consultant - 2 wheels;Bedside commode;Shower seat - built Hotel manager: [employer]acher        Prior Functioning/Environment Level of Independence: Independent             OT Diagnosis: Acute pain   OT Problem List: Impaired balance (sitting and/or standing);Decreased knowledge of use of DME or AE;Decreased knowledge of precautions;Pain   OT Treatment/Interventions: Self-care/ADL training;DME and/or AE instruction;Therapeutic activities;Patient/family education;Balance training    OT Goals(Current goals can be found in the care plan section) Acute Rehab OT Goals Patient Stated Goal: not stated OT Goal Formulation: With patient Time For Goal Achievement: 02/22/15 Potential to Achieve Goals: Good ADL Goals Pt Will Perform Grooming: with modified independence;with adaptive equipment;standing Pt Will Perform Lower Body Bathing: with modified independence;with adaptive equipment;sit to/from stand Pt Will Perform Lower Body Dressing: with modified  independence;with adaptive equipment;sit to/from stand Pt Will Transfer to Toilet: with modified independence;ambulating;bedside commode Pt Will Perform Toileting - Clothing Manipulation and hygiene: with modified independence;with adaptive equipment;sitting/lateral leans;sit to/from stand Pt Will Perform Tub/Shower Transfer: Shower transfer;with modified independence;ambulating;shower seat;rolling walker  OT Frequency: Min 2X/week   Barriers to D/C:            Co-evaluation              End of Session Equipment Utilized During Treatment: Gait belt;Rolling walker;Back brace;Oxygen;Other (comment) (off oxygen during functional mobility)  Activity Tolerance: Patient tolerated treatment well Patient left: in bed;with call bell/phone within reach;with bed alarm set;with family/visitor present;with SCD's reapplied   Time: 3220-2542 OT Time Calculation (min): 25 min Charges:  OT General Charges $OT Visit: 1 Procedure OT Evaluation $Initial OT Evaluation Tier I: 1 Procedure OT Treatments $Self Care/Home Management : 8-22 mins G-Codes:    Hortencia Pilar 2015/02/18, 9:56 AM

## 2015-02-15 NOTE — Progress Notes (Addendum)
   02/15/15 0752  Pressure Ulcer Prevention  Repositioned Sitting  Positioning Frequency Able to turn self  Mobility  Activity Ambulate in room;Chair  Level of Assistance Minimal assist, patient does 75% or more  Range of Motion Active;All extremities     POD1. Patient ambulated in room to chair with minimal assistance. C/o minimal pain at this time. Encourage PCA for pain management. S/E of PCA reviewed. Tolerate chair position well. Will assist back to bed within 1 hour.

## 2015-02-15 NOTE — Progress Notes (Signed)
Patient request to have all her AM medications change to 2100.   Ave Filter, RN

## 2015-02-15 NOTE — Progress Notes (Signed)
Subjective: Patient reports No leg pain minimal back pain  Objective: Vital signs in last 24 hours: Temp:  [97.2 F (36.2 C)-98.6 F (37 C)] 98.6 F (37 C) (09/15 0530) Pulse Rate:  [67-116] 67 (09/15 0530) Resp:  [9-26] 16 (09/15 0530) BP: (105-130)/(60-72) 105/60 mmHg (09/15 0530) SpO2:  [97 %-100 %] 98 % (09/15 0530) Weight:  [95.709 kg (211 lb)] 95.709 kg (211 lb) (09/14 1323)  Intake/Output from previous day: 09/14 0701 - 09/15 0700 In: 2800 [I.V.:2300; IV Piggyback:500] Out: 2725 [Urine:2050; Drains:150; Blood:525] Intake/Output this shift:    Strength out of 5 wound clean dry and intact  Lab Results: No results for input(s): WBC, HGB, HCT, PLT in the last 72 hours. BMET No results for input(s): NA, K, CL, CO2, GLUCOSE, BUN, CREATININE, CALCIUM in the last 72 hours.  Studies/Results: Dg Lumbar Spine 2-3 Views  02/14/2015   CLINICAL DATA:  Patient status post L3-L4 PLIF.  EXAM: DG C-ARM 61-120 MIN; LUMBAR SPINE - 2-3 VIEW  COMPARISON:  Lumbar spine CT 01/01/2015  FINDINGS: Two intraoperative fluoroscopic images were sent. These demonstrate transpedicular screws at the L3-4 level. Intervertebral spacer is present. No acute process.  IMPRESSION: Intraoperative fluoroscopic examination. Transpedicular screws at the L3 and L4 level.   Electronically Signed   By: Lovey Newcomer M.D.   On: 02/14/2015 17:28   Dg C-arm 1-60 Min  02/14/2015   CLINICAL DATA:  Patient status post L3-L4 PLIF.  EXAM: DG C-ARM 61-120 MIN; LUMBAR SPINE - 2-3 VIEW  COMPARISON:  Lumbar spine CT 01/01/2015  FINDINGS: Two intraoperative fluoroscopic images were sent. These demonstrate transpedicular screws at the L3-4 level. Intervertebral spacer is present. No acute process.  IMPRESSION: Intraoperative fluoroscopic examination. Transpedicular screws at the L3 and L4 level.   Electronically Signed   By: Lovey Newcomer M.D.   On: 02/14/2015 17:28    Assessment/Plan: Progressive mobilization today with physical and  occupational therapy continued Hemovac drain.  LOS: 1 day     Khaleed Holan P 02/15/2015, 7:48 AM

## 2015-02-15 NOTE — Progress Notes (Signed)
PIV infiltrated. IV team consult for difficult restart. PO pain medication given at this time for comfort. Will restart IV PCA once IV obtained. Will continue to monitor.   Ave Filter, RN

## 2015-02-15 NOTE — Care Management Note (Signed)
Case Management Note  Patient Details  Name: [name] [name] MRN: [id number] Date of Birth: [birth year]  Subjective/Objective:                    Action/Plan: Pt admitted with herniated nucleus pulposus of the lumbar. Pt is from home with spouse. CM will continue to follow for discharge needs.  Expected Discharge Date:                  Expected Discharge Plan:  Home/Self Care  In-House Referral:     Discharge planning Services     Post Acute Care Choice:    Choice offered to:     DME Arranged:    DME Agency:     HH Arranged:    HH Agency:     Status of Service:  In process, will continue to follow  Medicare Important Message Given:    Date Medicare IM Given:    Medicare IM give by:    Date Additional Medicare IM Given:    Additional Medicare Important Message give by:     If discussed at [place] of Stay Meetings, dates discussed:    Additional Comments:  Ollen Gross, RN 02/15/2015, 2:42 PM

## 2015-02-15 NOTE — Clinical Social Work Note (Signed)
CSW consult acknowledged:  Clinical Education officer, museum received consult for SNF placement. PT/OT is currently recommending "no PT/OT follow up".   Clinical Social Worker will sign off for now as social work intervention is no longer needed. Please consult [place] again if new need arises.  Glendon Axe, MSW, LCSWA [phone] 02/15/2015 3:51 PM

## 2015-02-15 NOTE — Evaluation (Signed)
Physical Therapy Evaluation Patient Details Name: [name] [name] MRN: [id number] DOB: Nov 11, 1955 Today's Date: 02/15/2015   History of Present Illness  s/p posterior lumbar interbody fusion L3-4 with hardware removal; previously undergone L4-5 left lumbar fusion   Clinical Impression  Patient presents with decreased mobility due to deficits listed in PT problem list.  She will benefit from skilled PT in the acute setting to allow return home with spouse assist.  Likely no need for initial follow up PT, but may need outpatient referral when cleared by surgeon for core strength and continued body mechanics education.  Was participating in PT prior to surgery.    Follow Up Recommendations Supervision for mobility/OOB;No PT follow up    Equipment Recommendations  None recommended by PT    Recommendations for Other Services       Precautions / Restrictions Precautions Precautions: Back Precaution Booklet Issued: Yes (comment) Precaution Comments: reviewed precautions Required Braces or Orthoses: Spinal Brace Spinal Brace: Lumbar corset;Applied in sitting position Restrictions Weight Bearing Restrictions: No      Mobility  Bed Mobility Overal bed mobility: Needs Assistance Bed Mobility: Rolling;Sidelying to Sit;Sit to Sidelying Rolling: Supervision Sidelying to sit: Supervision     Sit to sidelying: Min guard General bed mobility comments: with rail assist, cues, minguard for guiding legs on bed, step by step cues for sit to supine  Transfers Overall transfer level: Needs assistance Equipment used: Rolling walker (2 wheeled) Transfers: Sit to/from Stand Sit to Stand: Min assist         General transfer comment: up from edge of bed   Ambulation/Gait Ambulation/Gait assistance: Min guard;+2 safety/equipment (assist for IV) Ambulation Distance (Feet): 200 Feet Assistive device: Rolling walker (2 wheeled) Gait Pattern/deviations: Step-through pattern;Decreased stride  length     General Gait Details: cues for technique on turns due to wide turns  Financial trader Rankin (Stroke Patients Only)       Balance Overall balance assessment: Needs assistance Sitting-balance support: Feet supported Sitting balance-Leahy Scale: Good     Standing balance support: Bilateral upper extremity supported;During functional activity Standing balance-Leahy Scale: Fair Standing balance comment: RW for ambulation                             Pertinent Vitals/Pain Pain Assessment: 0-10 Pain Score: 8  Pain Location: at surgical site with ambulation Pain Descriptors / Indicators: Sore Pain Intervention(s): Monitored during session;Repositioned    Home Living Family/patient expects to be discharged to:: Private residence Living Arrangements: Spouse/significant other Available Help at Discharge: Family;Available 24 hours/day Type of Home: House Home Access: Stairs to enter Entrance Stairs-Rails: Right;Left;Can reach both Entrance Stairs-Number of Steps: 5 Home Layout: Two level;Bed/bath upstairs Home Equipment: Walker - 2 wheels;Bedside commode;Shower seat - built Investment banker, operational      Prior Function Level of Independence: Independent               Hand Dominance   Dominant Hand: Right    Extremity/Trunk Assessment   Upper Extremity Assessment: Defer to OT evaluation           Lower Extremity Assessment: Overall WFL for tasks assessed      Cervical / Trunk Assessment:  (s/p back surgery)  Communication   Communication: No difficulties  Cognition Arousal/Alertness: Awake/alert Behavior During Therapy: WFL for tasks assessed/performed Overall Cognitive Status: Within Functional Limits for tasks  assessed                      General Comments General comments (skin integrity, edema, etc.): on RA during in-room functional mobility O2 98.    Exercises General Exercises -  Lower Extremity Ankle Circles/Pumps: AROM;Both;10 reps;Supine Short Arc Quad: AROM;Both;5 reps;Supine Heel Slides: AROM;Both;5 reps;Supine Hip ABduction/ADduction: AROM;Both;5 reps;Supine      Assessment/Plan    PT Assessment Patient needs continued PT services  PT Diagnosis Acute pain   PT Problem List Decreased mobility;Pain;Decreased knowledge of precautions;Decreased activity tolerance  PT Treatment Interventions DME instruction;Gait training;Stair training;Functional mobility training;Patient/family education;Therapeutic activities;Therapeutic exercise   PT Goals (Current goals can be found in the Care Plan section) Acute Rehab PT Goals Patient Stated Goal: not stated PT Goal Formulation: With patient Time For Goal Achievement: 02/19/15 Potential to Achieve Goals: Good    Frequency Min 6X/week   Barriers to discharge        Co-evaluation               End of Session Equipment Utilized During Treatment: Back brace Activity Tolerance: Patient tolerated treatment well Patient left: in bed;with call bell/phone within reach;with bed alarm set           Time: 0355-9741 PT Time Calculation (min) (ACUTE ONLY): 31 min   Charges:   PT Evaluation $Initial PT Evaluation Tier I: 1 Procedure PT Treatments $Gait Training: 8-22 mins   PT G Codes:        Darletta Noblett,CYNDI 02/17/2015, 12:05 PM  Magda Kiel, [place] February 17, 2015

## 2015-02-16 MED ORDER — ADULT MULTIVITAMIN W/MINERALS CH: 1.0000 | ORAL_TABLET | Freq: Every day | ORAL | Status: DC

## 2015-02-16 MED ORDER — HYDROMORPHONE HCL 1 MG/ML IJ SOLN
0.5000 mg | INTRAMUSCULAR | Status: DC | PRN
  Administered 2015-02-16 – 2015-02-17 (×3): 0.5 mg via INTRAVENOUS
  Filled 2015-02-16 (×3): qty 1

## 2015-02-16 NOTE — Significant Event (Signed)
Discontinued PCA was not able to properly waste the amount in the pyxis. Night nurse pulled the Dilaudid and stated that 0.4667 was oulled instead of the full amount of 58ml. [name] [name] from [facility] came up to witness the waste. 3.6mg /27ml of Dilaudid wasted in the sink.

## 2015-02-16 NOTE — Progress Notes (Signed)
Lurline Del, RN had a hydromorphone PCA syringe with 12 mL of waste. The RN before her pulled out ~0.46 mg out of Pyxis. Pyxis would not allow Marci to waste 12 mL's because pyxis claimed it was more than the amount originally pulled out. Pyxis was not allowing her to create a discrepancy for some reason. I witnessed Lurline Del, RN waste 12 mLs of hydromorphone PCA syringe down the sink.   Albertina Parr, PharmD., BCPS Clinical Pharmacist Pager [phone]

## 2015-02-16 NOTE — Progress Notes (Signed)
Occupational Therapy Treatment Patient Details Name: ANAVEY COOMBES MRN: [id number] DOB: Oct 03, 1955 Today's Date: 02/16/2015    History of present illness s/p posterior lumbar interbody fusion L3-4 with hardware removal; previously undergone L4-5 left lumbar fusion    OT comments  Pt performing toileting and standing ADL with supervision and RW.  Pt able to state back precautions related to ADL and IADL. Pt hopeful to manage pain with oral medications so she may go home, currently on PCA.  Follow Up Recommendations  Supervision/Assistance - 24 hour;No OT follow up    Equipment Recommendations  None recommended by OT    Recommendations for Other Services      Precautions / Restrictions Precautions Precautions: Back Precaution Comments: reviewed precautions Required Braces or Orthoses: Spinal Brace Spinal Brace: Lumbar corset;Applied in sitting position       Mobility Bed Mobility     Rolling: Supervision Sidelying to sit: Supervision     Sit to sidelying: Min guard General bed mobility comments: use of rails for supine to sidelying; assist for feet in bed with no rail to sidelying  Transfers Overall transfer level: Needs assistance Equipment used: Rolling walker (2 wheeled) Transfers: Sit to/from Stand Sit to Stand: Supervision         General transfer comment: cues for hand placement    Balance             Standing balance-Leahy Scale: Fair                     ADL Overall ADL's : Needs assistance/impaired     Grooming: Wash/dry hands;Supervision/safety;Standing           Upper Body Dressing : Set up;Sitting Upper Body Dressing Details (indicate cue type and reason): donning and doffing back brace Lower Body Dressing: Supervision/safety;Sit to/from stand Lower Body Dressing Details (indicate cue type and reason): pt does not wear socks, has slip on shoes she plans to wear, knowledgeable in use of reacher Toilet Transfer:  Supervision/safety;Ambulation;RW   Toileting- Clothing Manipulation and Hygiene: Minimal assistance;Sit to/from stand Toileting - Clothing Manipulation Details (indicate cue type and reason): cues to avoid twisting during pericare     Functional mobility during ADLs: Supervision/safety;Rolling walker General ADL Comments: Pt has children and husband that can perform IADL until pt's back precautions are discontinued.      Vision                     Perception     Praxis      Cognition   Behavior During Therapy: WFL for tasks assessed/performed Overall Cognitive Status: Within Functional Limits for tasks assessed                       Extremity/Trunk Assessment               Exercises     Shoulder Instructions       General Comments      Pertinent Vitals/ Pain       Pain Assessment: Faces Pain Score: 8  Faces Pain Scale: Hurts whole lot Pain Location: back Pain Descriptors / Indicators: Aching Pain Intervention(s): PCA encouraged;Monitored during session;Repositioned  Home Living                                          Prior Functioning/Environment  Frequency       Progress Toward Goals  OT Goals(current goals can now be found in the care plan section)  Progress towards OT goals: Progressing toward goals     Plan Discharge plan remains appropriate    Co-evaluation                 End of Session     Activity Tolerance Patient tolerated treatment well   Patient Left in bed;with call bell/phone within reach;with bed alarm set   Nurse Communication          Time: 1131-1146 OT Time Calculation (min): 15 min  Charges: OT General Charges $OT Visit: 1 Procedure OT Treatments $Self Care/Home Management : 8-22 mins  Malka So 02/16/2015, 3:27 PM  [phone]

## 2015-02-16 NOTE — Progress Notes (Signed)
Physical Therapy Treatment Patient Details Name: [name] [name] MRN: [id number] DOB: Feb 25, 1956 Today's Date: 02/17/2015    History of Present Illness s/p posterior lumbar interbody fusion L3-4 with hardware removal; previously undergone L4-5 left lumbar fusion     PT Comments    Progressing with ambulation and stair negotiation this session.  Patient more sore today and still needing help for feet in bed, but feel will progress at home without follow up PT.  Follow Up Recommendations  Supervision for mobility/OOB;No PT follow up     Equipment Recommendations  None recommended by PT    Recommendations for Other Services       Precautions / Restrictions Precautions Precautions: Back Required Braces or Orthoses: Spinal Brace Spinal Brace: Lumbar corset;Applied in sitting position    Mobility  Bed Mobility     Rolling: Supervision Sidelying to sit: Supervision     Sit to sidelying: Min guard General bed mobility comments: use of rails for supine to sidelying; assist for feet in bed with no rail to sidelying  Transfers Overall transfer level: Needs assistance Equipment used: Rolling walker (2 wheeled) Transfers: Sit to/from Stand Sit to Stand: Supervision         General transfer comment: cues for hand placement  Ambulation/Gait Ambulation/Gait assistance: Supervision Ambulation Distance (Feet): 200 Feet Assistive device: Rolling walker (2 wheeled) Gait Pattern/deviations: Step-through pattern;Wide base of support     General Gait Details: increased shifting of hips side to side with antalgic pattern; stiff back   Stairs Stairs: Yes Stairs assistance: Min guard Stair Management: Step to pattern;Sideways;One rail Right Number of Stairs: 4 General stair comments: self selected sidways up steps with one rail  Wheelchair Mobility    Modified Rankin (Stroke Patients Only)       Balance             Standing balance-Leahy Scale: Fair                       Cognition Arousal/Alertness: Awake/alert Behavior During Therapy: WFL for tasks assessed/performed Overall Cognitive Status: Within Functional Limits for tasks assessed                      Exercises      General Comments        Pertinent Vitals/Pain Pain Score: 8  Pain Location: surgical site with ambulation Pain Descriptors / Indicators: Sore Pain Intervention(s): PCA encouraged;Repositioned    Home Living                      Prior Function            PT Goals (current goals can now be found in the care plan section) Progress towards PT goals: Progressing toward goals    Frequency  Min 6X/week    PT Plan Current plan remains appropriate    Co-evaluation             End of Session Equipment Utilized During Treatment: Back brace Activity Tolerance: Patient tolerated treatment well Patient left: in bed;with call bell/phone within reach     Time: 1005-1035 PT Time Calculation (min) (ACUTE ONLY): 30 min  Charges:  $Gait Training: 8-22 mins $Therapeutic Activity: 8-22 mins                    G Codes:      WYNN,CYNDI 02-17-15, 2:33 PM  Magda Kiel, [place] 02/17/2015

## 2015-02-16 NOTE — Progress Notes (Signed)
Subjective: Patient reports No leg pain condition of back soreness  Objective: Vital signs in last 24 hours: Temp:  [97.9 F (36.6 C)-99 F (37.2 C)] 98.5 F (36.9 C) (09/16 0545) Pulse Rate:  [63-92] 92 (09/16 0545) Resp:  [14-24] 18 (09/16 0545) BP: (101-114)/(46-62) 105/49 mmHg (09/16 0545) SpO2:  [96 %-100 %] 96 % (09/16 0545)  Intake/Output from previous day: 09/15 0701 - 09/16 0700 In: 720 [P.O.:720] Out: -  Intake/Output this shift:    Strength 5 out of 5 wound clean dry and intact  Lab Results: No results for input(s): WBC, HGB, HCT, PLT in the last 72 hours. BMET No results for input(s): NA, K, CL, CO2, GLUCOSE, BUN, CREATININE, CALCIUM in the last 72 hours.  Studies/Results: Dg Lumbar Spine 2-3 Views  02/14/2015   CLINICAL DATA:  Patient status post L3-L4 PLIF.  EXAM: DG C-ARM 61-120 MIN; LUMBAR SPINE - 2-3 VIEW  COMPARISON:  Lumbar spine CT 01/01/2015  FINDINGS: Two intraoperative fluoroscopic images were sent. These demonstrate transpedicular screws at the L3-4 level. Intervertebral spacer is present. No acute process.  IMPRESSION: Intraoperative fluoroscopic examination. Transpedicular screws at the L3 and L4 level.   Electronically Signed   By: Lovey Newcomer M.D.   On: 02/14/2015 17:28   Dg C-arm 1-60 Min  02/14/2015   CLINICAL DATA:  Patient status post L3-L4 PLIF.  EXAM: DG C-ARM 61-120 MIN; LUMBAR SPINE - 2-3 VIEW  COMPARISON:  Lumbar spine CT 01/01/2015  FINDINGS: Two intraoperative fluoroscopic images were sent. These demonstrate transpedicular screws at the L3-4 level. Intervertebral spacer is present. No acute process.  IMPRESSION: Intraoperative fluoroscopic examination. Transpedicular screws at the L3 and L4 level.   Electronically Signed   By: Lovey Newcomer M.D.   On: 02/14/2015 17:28    Assessment/Plan: 59-year-old female postop day 2 from a L3-4 plus doing fairly well continue to mobilize we'll stop her PCA and possible discharged later this afternoon or  tomorrow.  LOS: 2 days     CRAM,GARY P 02/16/2015, 7:33 AM

## 2015-02-17 MED ORDER — CYCLOBENZAPRINE HCL 10 MG PO TABS: 5.0000 mg | ORAL_TABLET | Freq: Three times a day (TID) | ORAL | Status: DC | PRN

## 2015-02-17 MED ORDER — OXYCODONE-ACETAMINOPHEN 5-325 MG PO TABS: 1.0000 | ORAL_TABLET | ORAL | Status: DC | PRN

## 2015-02-17 NOTE — Progress Notes (Signed)
Physical Therapy Treatment Patient Details Name: [name] [name] MRN: [id number] DOB: [birth year] Today's Date: 02/17/2015    History of Present Illness s/p posterior lumbar interbody fusion L3-4 with hardware removal; previously undergone L4-5 left lumbar fusion     PT Comments    Pt alert/oriented and willing to participate with PT.  She is now able to safely perform all functional mobility with Modified Independence, with the exception of requiring supervision for stairs.  However, pt notes her husband is supportive and can help with this.  Pt has been educated on spinal precautions, proper use of brace, car transfer technique, and activity at home with pt demonstrating good understanding and recall.  No further acute or follow-up PT required for safe discharge.    Follow Up Recommendations  Supervision for mobility/OOB;No PT follow up     Equipment Recommendations  None recommended by PT    Recommendations for Other Services       Precautions / Restrictions Precautions Precautions: Back Precaution Comments: reviewed precautions; pt with good recall Required Braces or Orthoses: Spinal Brace Spinal Brace: Lumbar corset;Applied in sitting position Restrictions Weight Bearing Restrictions: No    Mobility  Bed Mobility Overal bed mobility: Needs Assistance Bed Mobility: Rolling;Sidelying to Sit;Sit to Sidelying Rolling: Modified independent (Device/Increase time) (increased time; bed rail) Sidelying to sit: Modified independent (Device/Increase time) (HOB slightly elevated)     Sit to sidelying: Modified independent (Device/Increase time) (increased time)    Transfers Overall transfer level: Needs assistance Equipment used: Rolling walker (2 wheeled) Transfers: Sit to/from Stand Sit to Stand: Modified independent (Device/Increase time)         General transfer comment: increased time  Ambulation/Gait Ambulation/Gait assistance: Modified independent  (Device/Increase time) Ambulation Distance (Feet): 200 Feet Assistive device: Rolling walker (2 wheeled) Gait Pattern/deviations: Step-through pattern;Decreased stride length;Antalgic Gait velocity: slow Gait velocity interpretation: Below normal speed for age/gender General Gait Details: increased shifting of hips side to side with antalgic gait pattern with increased fatigue following stair training   Stairs Stairs: Yes Stairs assistance: Supervision Stair Management: One rail Right;Sideways;Step to pattern Number of Stairs: 8 General stair comments: self selected sidways up/down steps with one rail; verbal cueing with descending to ensure enough room on stair for both feet; increased pain with descending stairs  Wheelchair Mobility    Modified Rankin (Stroke Patients Only)       Balance Overall balance assessment: Needs assistance Sitting-balance support: Feet supported;No upper extremity supported Sitting balance-Leahy Scale: Good     Standing balance support: No upper extremity supported;During functional activity (when adjusting brace) Standing balance-Leahy Scale: Good                      Cognition Arousal/Alertness: Awake/alert Behavior During Therapy: WFL for tasks assessed/performed Overall Cognitive Status: Within Functional Limits for tasks assessed                      Exercises      General Comments General comments (skin integrity, edema, etc.): pt educated on spinal precautions, use of brace, car transfers, and activity at home      Pertinent Vitals/Pain Pain Assessment: Faces Faces Pain Scale: Hurts whole lot Pain Location: back (with ambulation and stairs) Pain Descriptors / Indicators: Aching Pain Intervention(s): Monitored during session;Limited activity within patient's tolerance    Home Living                      Prior Function  PT Goals (current goals can now be found in the care plan section)  Acute Rehab PT Goals Patient Stated Goal: to get back home PT Goal Formulation: With patient Time For Goal Achievement: 02/19/15 Potential to Achieve Goals: Good Progress towards PT goals: Goals met/education completed, patient discharged from PT    Frequency       PT Plan Current plan remains appropriate    Co-evaluation             End of Session Equipment Utilized During Treatment: Back brace Activity Tolerance: Patient tolerated treatment well (increased pain but did not limit treatment) Patient left: in bed;with call bell/phone within reach     Time: 0953-1021 PT Time Calculation (min) (ACUTE ONLY): 28 min  Charges:  $Gait Training: 8-22 mins $Self Care/Home Management: 8-22                    G Codes:      Amanda Tocci 2015-02-26, 1:23 PM  Lorita Officer, SPT February 26, 2015 1:27 PM

## 2015-02-17 NOTE — Discharge Summary (Signed)
Physical Therapy Discharge Patient Details Name: [name] [name] MRN: [id number] DOB: May 22, 1956 Today's Date: 02/17/2015 Time: 6389-3734 PT Time Calculation (min) (ACUTE ONLY): 28 min  Patient discharged from PT services secondary to goals met and no further PT needs identified.  Please see latest therapy progress note for current level of functioning and progress toward goals.    Progress and discharge plan discussed with patient and/or caregiver: Patient/Caregiver agrees with plan  GP     Lorita Officer 02/17/2015, 1:27 PM   Lorita Officer, SPT 02/17/2015 1:27 PM

## 2015-02-17 NOTE — Discharge Summary (Signed)
Physician Discharge Summary  Patient ID: [name] [name] MRN: [id number] DOB/AGE: 1956-04-19 59 y.o.  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 Saintclair Halsted who performed a L3-4 lumbar decompression and fusion. She is ambulating in the halls. We'll DC her Hemovac drain this morning, and we'll discharge her to home. We've given instructions regarding wound care and activities following discharge. She is scheduled follow-up with Dr. Saintclair Halsted in 11 days. Her wound is healing nicely; there is no erythema, swelling, or drainage.  Discharge Exam: Blood pressure 134/62, pulse 89, temperature 98.8 F (37.1 C), temperature source Oral, resp. rate 18, height 5\' 5"  (1.651 m), weight 95.709 kg (211 lb), SpO2 98 %.  Disposition:  home     Medication List    TAKE these medications        Amlodipine-Valsartan-HCTZ 5-160-12.5 MG Tabs  Take 1 tablet by mouth at bedtime.     aspirin 325 MG tablet  Take 325 mg by mouth daily.     atorvastatin 40 MG tablet  Commonly known as:  LIPITOR  Take 40 mg by mouth daily.     calcium-vitamin D 500-200 MG-UNIT per tablet  Take 1 tablet by mouth.     cetirizine 10 MG tablet  Commonly known as:  ZYRTEC  Take 10 mg by mouth daily.     cyclobenzaprine 15 MG 24 hr capsule  Commonly known as:  AMRIX  Take 15 mg by mouth daily as needed for muscle spasms.     cyclobenzaprine 10 MG tablet  Commonly known as:  FLEXERIL  Take 0.5-1 tablets (5-10 mg total) by mouth 3 (three) times daily as needed for muscle spasms.     EPINEPHRINE (ANAPHYLAXIS THERAPY AGENTS)  Inject 0.3 mg into the muscle.     estradiol 1 MG tablet  Commonly  known as:  ESTRACE  Take 1 mg by mouth daily.     fenofibrate 145 MG tablet  Commonly known as:  TRICOR  Take 145 mg by mouth daily.     fluticasone 27.5 MCG/SPRAY nasal spray  Commonly known as:  VERAMYST  Place 2 sprays into the nose daily.     GAVISCON EXTRA STRENGTH PO  Take 15 mLs by mouth daily as needed.     ibuprofen 800 MG tablet  Commonly known as:  ADVIL,MOTRIN  Take 800 mg by mouth every 8 (eight) hours as needed for moderate pain.     latanoprost 0.005 % ophthalmic solution  Commonly known as:  XALATAN  Place 1 drop into both eyes at bedtime.     montelukast 10 MG tablet  Commonly known as:  SINGULAIR  Take 10 mg by mouth at bedtime.     multivitamin with minerals tablet  Take 1 tablet by mouth daily.     olopatadine 0.1 % ophthalmic solution  Commonly known as:  PATANOL  Place 1 drop into both eyes daily.     oxyCODONE-acetaminophen 5-325 MG per tablet  Commonly known as:  PERCOCET/ROXICET  Take 1-2 tablets by mouth every 4 (four) hours as needed for moderate pain.     potassium chloride SA 20 MEQ tablet  Commonly known as:  K-DUR,KLOR-CON  Take 20 mEq by mouth daily.  SitaGLIPtin-MetFORMIN HCl [phone] MG Tb24  Take 1 tablet by mouth at bedtime.     traMADol 50 MG tablet  Commonly known as:  ULTRAM  Take 50 mg by mouth every 6 (six) hours as needed for pain.     Vitamin D (Ergocalciferol) 50000 UNITS Caps capsule  Commonly known as:  DRISDOL  Take 50,000 Units by mouth 2 (two) times a week. Monday and Sunday         Signed: Hosie Spangle 02/17/2015, 8:43 AM

## 2015-02-17 NOTE — Progress Notes (Signed)
Discharge instructions reviewed with patient. All questions answered at this time. RXs given to patient. Transport by family.   Ave Filter, RN

## 2015-02-17 NOTE — Progress Notes (Signed)
Patient requested to have all her medication at night. She will resume all her medication once discharge today. Stool softener given.  Ave Filter, RN

## 2015-02-20 ENCOUNTER — Ambulatory Visit: Payer: Self-pay

## 2015-02-23 ENCOUNTER — Encounter (HOSPITAL_BASED_OUTPATIENT_CLINIC_OR_DEPARTMENT_OTHER): Payer: Self-pay | Admitting: *Deleted

## 2015-02-23 ENCOUNTER — Emergency Department (HOSPITAL_BASED_OUTPATIENT_CLINIC_OR_DEPARTMENT_OTHER)
Admission: EM | Admit: 2015-02-23 | Discharge: 2015-02-23 | Disposition: A | Discharge status: Home / Self Care | Payer: BLUE CROSS/BLUE SHIELD | Attending: Emergency Medicine | Admitting: Emergency Medicine

## 2015-02-23 DIAGNOSIS — M1990 Unspecified osteoarthritis, unspecified site: Secondary | ICD-10-CM | POA: Insufficient documentation

## 2015-02-23 DIAGNOSIS — Y9289 Other specified places as the place of occurrence of the external cause: Secondary | ICD-10-CM | POA: Insufficient documentation

## 2015-02-23 DIAGNOSIS — Z8701 Personal history of pneumonia (recurrent): Secondary | ICD-10-CM | POA: Diagnosis not present

## 2015-02-23 DIAGNOSIS — Z7982 Long term (current) use of aspirin: Secondary | ICD-10-CM | POA: Insufficient documentation

## 2015-02-23 DIAGNOSIS — Z7951 Long term (current) use of inhaled steroids: Secondary | ICD-10-CM | POA: Insufficient documentation

## 2015-02-23 DIAGNOSIS — Z79899 Other long term (current) drug therapy: Secondary | ICD-10-CM | POA: Insufficient documentation

## 2015-02-23 DIAGNOSIS — E119 Type 2 diabetes mellitus without complications: Secondary | ICD-10-CM | POA: Insufficient documentation

## 2015-02-23 DIAGNOSIS — Z880 Allergy status to penicillin: Secondary | ICD-10-CM | POA: Insufficient documentation

## 2015-02-23 DIAGNOSIS — Y9389 Activity, other specified: Secondary | ICD-10-CM | POA: Insufficient documentation

## 2015-02-23 DIAGNOSIS — E785 Hyperlipidemia, unspecified: Secondary | ICD-10-CM | POA: Insufficient documentation

## 2015-02-23 DIAGNOSIS — H409 Unspecified glaucoma: Secondary | ICD-10-CM | POA: Diagnosis not present

## 2015-02-23 DIAGNOSIS — Z8709 Personal history of other diseases of the respiratory system: Secondary | ICD-10-CM | POA: Diagnosis not present

## 2015-02-23 DIAGNOSIS — Z8719 Personal history of other diseases of the digestive system: Secondary | ICD-10-CM | POA: Diagnosis not present

## 2015-02-23 DIAGNOSIS — L309 Dermatitis, unspecified: Secondary | ICD-10-CM

## 2015-02-23 DIAGNOSIS — T7849XA Other allergy, initial encounter: Secondary | ICD-10-CM | POA: Diagnosis not present

## 2015-02-23 DIAGNOSIS — I10 Essential (primary) hypertension: Secondary | ICD-10-CM | POA: Insufficient documentation

## 2015-02-23 DIAGNOSIS — Z91040 Latex allergy status: Secondary | ICD-10-CM | POA: Diagnosis not present

## 2015-02-23 DIAGNOSIS — Z4801 Encounter for change or removal of surgical wound dressing: Secondary | ICD-10-CM | POA: Diagnosis present

## 2015-02-23 DIAGNOSIS — X58XXXA Exposure to other specified factors, initial encounter: Secondary | ICD-10-CM | POA: Diagnosis not present

## 2015-02-23 DIAGNOSIS — Y998 Other external cause status: Secondary | ICD-10-CM | POA: Diagnosis not present

## 2015-02-23 DIAGNOSIS — T7840XA Allergy, unspecified, initial encounter: Secondary | ICD-10-CM

## 2015-02-23 MED ORDER — DOXYCYCLINE HYCLATE 100 MG PO TABS
100.0000 mg | ORAL_TABLET | Freq: Once | ORAL | Status: AC
  Administered 2015-02-23: 100 mg via ORAL
  Filled 2015-02-23: qty 1

## 2015-02-23 MED ORDER — HYDROCORTISONE 1 % EX CREA
TOPICAL_CREAM | Freq: Once | CUTANEOUS | Status: AC
  Administered 2015-02-23: 22:00:00 via TOPICAL
  Filled 2015-02-23: qty 28

## 2015-02-23 MED ORDER — DOXYCYCLINE HYCLATE 100 MG PO CAPS: 100.0000 mg | ORAL_CAPSULE | Freq: Two times a day (BID) | ORAL | Status: DC

## 2015-02-23 MED ORDER — FLUCONAZOLE 200 MG PO TABS: 200.0000 mg | ORAL_TABLET | Freq: Once | ORAL | Status: AC

## 2015-02-23 NOTE — Discharge Instructions (Signed)
It was our pleasure to provide your ER care today - we hope that you feel better.  We discussed your case with your surgeon, Dr Saintclair Halsted.  Keep wound very clean.  Apply thin coat of hydrocortisone cream to inflamed skin on either side of incision 2x/day for the next week.  Take antibiotic (doxycycline) as prescribed.  Follow up with Dr Saintclair Halsted in the coming week.  Return to ER if worse, new symptoms, fevers, spreading redness, pus from wound, other concern.

## 2015-02-23 NOTE — ED Provider Notes (Addendum)
CSN: [id number]     Arrival date & time 02/23/15  2022 History   First MD Initiated Contact with Patient 02/23/15 2029     Chief Complaint  Patient presents with  . Wound Check     (Consider location/radiation/quality/duration/timing/severity/associated sxs/prior Treatment) Patient is a 59 y.o. female presenting with wound check. The history is provided by the patient.  Wound Check Pertinent negatives include no headaches and no shortness of breath.  Patient pod 9 s/p lumbar disc surgery, presents c/o itching discomfort, skin irritation around surgical wound for the past several days. No acute or abrupt change or worsening today, but persistence of symptoms. Spouse indicates no pus has come from wound, but some clear fluid from blistered skin.  Pt indicates hx latex and certain types of tape allergy, but that has had only dry dressing covering wound w cloth tape along edges of dressing. Pt states does not feel sick or ill. No nv. No fever, chills or sweats. States back pain/radicular pain improved since surgery.      Past Medical History  Diagnosis Date  . Complication of anesthesia     Patient woke up during bunionectomy and breast cyst removal surgery  . PONV (postoperative nausea and vomiting)     Slight nausea  . Hypertension   . Bronchitis   . Pneumonia 2014  . Diabetes mellitus without complication   . GERD (gastroesophageal reflux disease)   . Arthritis     left foot  . Environmental and seasonal allergies   . Hyperlipemia   . Optic nerve disorder     left eye  . Glaucoma     Bilateral   Past Surgical History  Procedure Laterality Date  . Abdominal hysterectomy    . Bunionectomy Bilateral   . Breast cyst excision Bilateral   . Finger surgery Left     Thumb  . Back surgery      X 3  . Colonoscopy     No family history on file. Social History  Substance Use Topics  . Smoking status: Never Smoker   . Smokeless tobacco: Never Used  . Alcohol Use: 0.0 oz/week      0 Standard drinks or equivalent per week     Comment: rare   OB History    No data available     Review of Systems  Constitutional: Negative for fever, chills and diaphoresis.  Respiratory: Negative for cough and shortness of breath.   Gastrointestinal: Negative for nausea and vomiting.  Genitourinary: Negative for dysuria.  Musculoskeletal: Negative for neck pain and neck stiffness.  Neurological: Negative for weakness, numbness and headaches.      Allergies  Codeine; Erythromycin; Amoxicillin; Other; Adhesive; Amoxicillin-pot clavulanate; Iodinated diagnostic agents; and Latex  Home Medications   Prior to Admission medications   Medication Sig Start Date End Date Taking? Authorizing Provider  Alum Hydroxide-Mag Carbonate (GAVISCON EXTRA STRENGTH PO) Take 15 mLs by mouth daily as needed.    Historical Provider, MD  Amlodipine-Valsartan-HCTZ 5-160-12.5 MG TABS Take 1 tablet by mouth at bedtime. 07/17/14   Historical Provider, MD  aspirin 325 MG tablet Take 325 mg by mouth daily.    Historical Provider, MD  atorvastatin (LIPITOR) 40 MG tablet Take 40 mg by mouth daily.    Historical Provider, MD  Calcium Carbonate-Vitamin D (CALCIUM-VITAMIN D) 500-200 MG-UNIT per tablet Take 1 tablet by mouth.    Historical Provider, MD  cetirizine (ZYRTEC) 10 MG tablet Take 10 mg by mouth daily.    Historical  Provider, MD  cyclobenzaprine (AMRIX) 15 MG 24 hr capsule Take 15 mg by mouth daily as needed for muscle spasms.    Historical Provider, MD  cyclobenzaprine (FLEXERIL) 10 MG tablet Take 0.5-1 tablets (5-10 mg total) by mouth 3 (three) times daily as needed for muscle spasms. 02/17/15   Jovita Gamma, MD  EPINEPHRINE, ANAPHYLAXIS THERAPY AGENTS, Inject 0.3 mg into the muscle. 09/01/12   Historical Provider, MD  estradiol (ESTRACE) 1 MG tablet Take 1 mg by mouth daily.    Historical Provider, MD  fenofibrate (TRICOR) 145 MG tablet Take 145 mg by mouth daily.    Historical Provider, MD   fluticasone (VERAMYST) 27.5 MCG/SPRAY nasal spray Place 2 sprays into the nose daily.    Historical Provider, MD  ibuprofen (ADVIL,MOTRIN) 800 MG tablet Take 800 mg by mouth every 8 (eight) hours as needed for moderate pain.    Historical Provider, MD  latanoprost (XALATAN) 0.005 % ophthalmic solution Place 1 drop into both eyes at bedtime.    Historical Provider, MD  montelukast (SINGULAIR) 10 MG tablet Take 10 mg by mouth at bedtime.    Historical Provider, MD  Multiple Vitamins-Minerals (MULTIVITAMIN WITH MINERALS) tablet Take 1 tablet by mouth daily.    Historical Provider, MD  olopatadine (PATANOL) 0.1 % ophthalmic solution Place 1 drop into both eyes daily.     Historical Provider, MD  oxyCODONE-acetaminophen (PERCOCET/ROXICET) 5-325 MG per tablet Take 1-2 tablets by mouth every 4 (four) hours as needed for moderate pain. 02/17/15   Jovita Gamma, MD  potassium chloride SA (K-DUR,KLOR-CON) 20 MEQ tablet Take 20 mEq by mouth daily.    Historical Provider, MD  SitaGLIPtin-MetFORMIN HCl [phone] MG TB24 Take 1 tablet by mouth at bedtime.    Historical Provider, MD  traMADol (ULTRAM) 50 MG tablet Take 50 mg by mouth every 6 (six) hours as needed for pain.    Historical Provider, MD  Vitamin D, Ergocalciferol, (DRISDOL) 50000 UNITS CAPS capsule Take 50,000 Units by mouth 2 (two) times a week. Monday and Sunday    Historical Provider, MD   BP 111/91 mmHg  Pulse 100  Temp(Src) 99 F (37.2 C) (Oral)  Resp 18  Ht 5\' 5"  (1.651 m)  Wt 206 lb (93.441 kg)  BMI 34.28 kg/m2  SpO2 100% Physical Exam  Constitutional: She appears well-developed and well-nourished. No distress.  Eyes: Conjunctivae are normal. No scleral icterus.  Neck: Neck supple. No tracheal deviation present.  Cardiovascular: Normal rate.   Pulmonary/Chest: Effort normal. No respiratory distress.  Abdominal: Normal appearance.  Musculoskeletal: She exhibits no edema.  Neurological: She is alert.  Skin: Skin is warm and dry.  She is not diaphoretic.  Healing lumbar surgical wound.  Surround skin appears inflamed, w several blistered areas, a few of which are draining tiny amount clear fluid. No pus. No cellulitis. No fluctuance/abscess. No tenderness to area.   Psychiatric: She has a normal mood and affect.  Nursing note and vitals reviewed.   ED Course  Procedures (including critical care time) Labs Review     MDM   Pt eager than we discuss with her surgeon - will place image in chart, and discuss.  Reviewed nursing notes and prior charts for additional history.        Discussed pt with Dr Saintclair Halsted, he requests rx steroid cream to inflamed skin around incision, and requests we also give rx doxy 100 bid, he will f/u w pt.   Pt requests rx fluconazole for home, as states when  takes abx, often gets vaginal yeast infxn - rx provided.      Lajean Saver, MD 02/23/15 [phone]

## 2015-02-23 NOTE — ED Notes (Signed)
Back surgery last week. She is here to have her incision checked for possible infection.

## 2015-03-07 ENCOUNTER — Ambulatory Visit (INDEPENDENT_AMBULATORY_CARE_PROVIDER_SITE_OTHER): Payer: BLUE CROSS/BLUE SHIELD | Admitting: Internal Medicine

## 2015-03-07 ENCOUNTER — Encounter: Payer: Self-pay | Admitting: Internal Medicine

## 2015-03-07 ENCOUNTER — Other Ambulatory Visit (INDEPENDENT_AMBULATORY_CARE_PROVIDER_SITE_OTHER): Payer: BLUE CROSS/BLUE SHIELD

## 2015-03-07 VITALS — BP 116/70 | HR 108 | Ht 65.0 in | Wt 206.2 lb

## 2015-03-07 DIAGNOSIS — K219 Gastro-esophageal reflux disease without esophagitis: Secondary | ICD-10-CM

## 2015-03-07 DIAGNOSIS — R1012 Left upper quadrant pain: Secondary | ICD-10-CM

## 2015-03-07 DIAGNOSIS — Z1211 Encounter for screening for malignant neoplasm of colon: Secondary | ICD-10-CM | POA: Diagnosis not present

## 2015-03-07 LAB — LIPASE: LIPASE: 33 U/L (ref 11.0–59.0)

## 2015-03-07 LAB — AMYLASE: AMYLASE: 47 U/L (ref 27–131)

## 2015-03-07 NOTE — Progress Notes (Signed)
Quick Note:  Please tell her pancreas tests are ok ______

## 2015-03-07 NOTE — Progress Notes (Signed)
  Referred by: Lucianne Lei, MD  Subjective:    Patient ID: [name] [name], female    DOB: Nov 03, 1955, 59 y.o.   MRN: [id number] Cc: LUQ pain, GERD, colon cancer screening HPI Patient is here, I performed a screening colonoscopy on her 10 years ago and an EGD in 2009. She is complaining of every 2 weeks of spells of left and right upper quadrant pains that starts in the left and radiates across the belly. She has dyspnea or tachypnea with that. It awakens her from sleep. There does not seem to be clear-cut associated reflux though she does have sometimes heartburn. No medication changes. This problem is been going on for years and concerns are somewhat. She does take Janumet but is been on that for a number of years and is not sure that the onset of symptoms are correlated with the use of the Januvia component that. She does not think so. GI review of systems is otherwise negative. Medications, allergies, past medical history, past surgical history, family history and social history are reviewed and updated in the EMR.   Review of Systems As above. Has some allergies back pain and arthritis symptoms. All other review of systems are negative.    Objective:   Physical Exam .@BP  116/70 mmHg  Pulse 108  Ht 5\' 5"  (1.651 m)  Wt 206 lb 4 oz (93.554 kg)  BMI 34.32 kg/m2@  General:  Well-developed, well-nourished and in no acute distress Eyes:  anicteric. Lungs: Clear to auscultation bilaterally. Heart:  S1S2, no rubs, murmurs, gallops. Abdomen:  soft, mildly tender LUQ, no hepatosplenomegaly, hernia, or mass and BS+.  Rectal: Lymph:  no cervical or supraclavicular adenopathy. Extremities:   no edema, cyanosis or clubbing Skin   no rash. Neuro:  A&O x 3.  Psych:  appropriate mood and  Affect.   Data Reviewed: Wt Readings from Last 3 Encounters:  03/07/15 206 lb 4 oz (93.554 kg)  02/23/15 206 lb (93.441 kg)  02/14/15 211 lb (95.709 kg)         Assessment & Plan:  LUQ  pain  Gastroesophageal reflux disease, esophagitis presence not specified  Colon cancer screening   EGD Colonoscopy  The risks and benefits as well as alternatives of endoscopic procedure(s) have been discussed and reviewed. All questions answered. The patient agrees to proceed.  Amylase and lipase (? januvia effects) these were normal.  I appreciate the opportunity to care for this patient. CC: Lucianne Lei, MD

## 2015-03-07 NOTE — Patient Instructions (Addendum)
You have been scheduled for an endoscopy and colonoscopy. Please follow the written instructions given to you at your visit today. Please pick up your prep supplies at the pharmacy within the next 1-3 days. If you use inhalers (even only as needed), please bring them with you on the day of your procedure.   Your physician has requested that you go to the basement for  lab work before leaving today.  I appreciate the opportunity to care for you. Silvano Rusk, MD, [facility]

## 2015-03-09 ENCOUNTER — Encounter: Payer: Self-pay | Admitting: Internal Medicine

## 2015-03-22 ENCOUNTER — Ambulatory Visit
Admission: RE | Admit: 2015-03-22 | Discharge: 2015-03-22 | Disposition: A | Discharge status: Home / Self Care | Payer: BLUE CROSS/BLUE SHIELD | Source: Ambulatory Visit | Attending: Endocrinology | Admitting: Endocrinology

## 2015-03-22 DIAGNOSIS — Z1231 Encounter for screening mammogram for malignant neoplasm of breast: Secondary | ICD-10-CM

## 2015-03-27 ENCOUNTER — Other Ambulatory Visit: Payer: Self-pay | Admitting: Endocrinology

## 2015-03-27 DIAGNOSIS — R928 Other abnormal and inconclusive findings on diagnostic imaging of breast: Secondary | ICD-10-CM

## 2015-04-03 ENCOUNTER — Ambulatory Visit
Admission: RE | Admit: 2015-04-03 | Discharge: 2015-04-03 | Disposition: A | Discharge status: Home / Self Care | Payer: BLUE CROSS/BLUE SHIELD | Source: Ambulatory Visit | Attending: Endocrinology | Admitting: Endocrinology

## 2015-04-03 DIAGNOSIS — R928 Other abnormal and inconclusive findings on diagnostic imaging of breast: Secondary | ICD-10-CM

## 2015-05-08 ENCOUNTER — Ambulatory Visit (AMBULATORY_SURGERY_CENTER): Payer: BLUE CROSS/BLUE SHIELD | Admitting: Internal Medicine

## 2015-05-08 ENCOUNTER — Encounter: Payer: Self-pay | Admitting: Internal Medicine

## 2015-05-08 ENCOUNTER — Other Ambulatory Visit (INDEPENDENT_AMBULATORY_CARE_PROVIDER_SITE_OTHER): Payer: BLUE CROSS/BLUE SHIELD

## 2015-05-08 VITALS — BP 128/61 | HR 90 | Temp 96.1°F | Resp 24 | Ht 65.0 in | Wt 206.0 lb

## 2015-05-08 DIAGNOSIS — Z1211 Encounter for screening for malignant neoplasm of colon: Secondary | ICD-10-CM | POA: Diagnosis not present

## 2015-05-08 DIAGNOSIS — K621 Rectal polyp: Secondary | ICD-10-CM | POA: Diagnosis not present

## 2015-05-08 DIAGNOSIS — D124 Benign neoplasm of descending colon: Secondary | ICD-10-CM

## 2015-05-08 DIAGNOSIS — R1012 Left upper quadrant pain: Secondary | ICD-10-CM | POA: Diagnosis present

## 2015-05-08 LAB — CREATININE, SERUM: Creatinine, Ser: 1.03 mg/dL (ref 0.40–1.20)

## 2015-05-08 LAB — GLUCOSE, CAPILLARY: GLUCOSE-CAPILLARY: 87 mg/dL (ref 65–99)

## 2015-05-08 LAB — BUN: BUN: 11 mg/dL (ref 6–23)

## 2015-05-08 MED ORDER — SODIUM CHLORIDE 0.9 % IV SOLN: 500.0000 mL | INTRAVENOUS | Status: DC

## 2015-05-08 NOTE — Op Note (Signed)
[place]  [address] [place], [address]   COLONOSCOPY PROCEDURE REPORT  PATIENT: [name] [name], [name] [name]  MR#: [id number] BIRTHDATE: May 25, 1956 , 59  yrs. old GENDER: female ENDOSCOPIST: Gatha Mayer, MD, [facility] PROCEDURE DATE:  05/08/2015 PROCEDURE:   Colonoscopy, screening and Colonoscopy with snare polypectomy First Screening Colonoscopy - Avg.  risk and is 50 yrs.  old or older - No.  Prior Negative Screening - Now for repeat screening. 10 or more years since last screening  History of Adenoma - Now for follow-up colonoscopy & has been > or = to 3 yrs.  N/A  Polyps removed today? Yes ASA CLASS:   Class II INDICATIONS:Screening for colonic neoplasia and Colorectal Neoplasm Risk Assessment for this procedure is average risk. MEDICATIONS: Residual sedation present, Propofol 100 mg IV, and Monitored anesthesia care  DESCRIPTION OF PROCEDURE:   After the risks benefits and alternatives of the procedure were thoroughly explained, informed consent was obtained.  The digital rectal exam revealed no abnormalities of the rectum.   The LB [id number]  endoscope was introduced through the anus and advanced to the cecum, which was identified by both the appendix and ileocecal valve. No adverse events experienced.   The quality of the prep was excellent. (MiraLax was used)  The instrument was then slowly withdrawn as the colon was fully examined. Estimated blood loss is zero unless otherwise noted in this procedure report.      COLON FINDINGS: A sessile polyp measuring 4 mm in size was found in the rectum.  A polypectomy was performed with a cold snare.  The resection was complete, the polyp tissue was completely retrieved and sent to histology.   The examination was otherwise normal. Retroflexed views revealed no abnormalities. The time to cecum = 2.8 Withdrawal time = 11.0   The scope was withdrawn and the procedure completed. COMPLICATIONS: There were no  immediate complications.  ENDOSCOPIC IMPRESSION: 1.   Sessile polyp was found in the rectum; polypectomy was performed with a cold snare 2.   The examination was otherwise normal  RECOMMENDATIONS: Timing of repeat colonoscopy will be determined by pathology findings.  eSigned:  Gatha Mayer, MD, [facility] 05/08/2015 2:12 PM   cc: the Patient and Dr. Luetta Nutting

## 2015-05-08 NOTE — Op Note (Signed)
[place]  [address] [place], [address]   ENDOSCOPY PROCEDURE REPORT  PATIENT: [name] [name], [name] [name]  MR#: [id number] BIRTHDATE: Dec 11, 1955 , 59  yrs. old GENDER: female ENDOSCOPIST: Gatha Mayer, MD, [facility] PROCEDURE DATE:  05/08/2015 PROCEDURE:  EGD, diagnostic ASA CLASS:     Class II INDICATIONS:  abdominal pain in upper left quadrant. MEDICATIONS: Propofol 200 mg IV and Monitored anesthesia care TOPICAL ANESTHETIC: none  DESCRIPTION OF PROCEDURE: After the risks benefits and alternatives of the procedure were thoroughly explained, informed consent was obtained.  The LB [id number] endoscope was introduced through the mouth and advanced to the second portion of the duodenum , Without limitations.  The instrument was slowly withdrawn as the mucosa was fully examined.      EXAM: The esophagus and gastroesophageal junction were completely normal in appearance.  The stomach was entered and closely examined.The antrum, angularis, and lesser curvature were well visualized, including a retroflexed view of the cardia and fundus. The stomach wall was normally distensable.  The scope passed easily through the pylorus into the duodenum.  Retroflexed views revealed no abnormalities.     The scope was then withdrawn from the patient and the procedure completed.  COMPLICATIONS: There were no immediate complications.  ENDOSCOPIC IMPRESSION: Normal appearing esophagus and GE junction, the stomach was well visualized and normal in appearance, normal appearing duodenum  RECOMMENDATIONS: Will check BUN/Creat today Then will order CT abd from office - need to know creatinine before deciding on IV contrast    eSigned:  Gatha Mayer, MD, [facility] 05/08/2015 2:08 PM    CC: The Patient and Dr. Luetta Nutting

## 2015-05-08 NOTE — Patient Instructions (Addendum)
I found one small colon polyp. All else ok.  I am going to order some labs and then a CT scan to evaluate your pain more. I need to check kidney function first.  I appreciate the opportunity to care for you. Gatha Mayer, MD, FACG YOU HAD AN ENDOSCOPIC PROCEDURE TODAY AT [facility]:   Refer to the procedure report that was given to you for any specific questions about what was found during the examination.  If the procedure report does not answer your questions, please call your gastroenterologist to clarify.  If you requested that your care partner not be given the details of your procedure findings, then the procedure report has been included in a sealed envelope for you to review at your convenience later.  YOU SHOULD EXPECT: Some feelings of bloating in the abdomen. Passage of more gas than usual.  Walking can help get rid of the air that was put into your GI tract during the procedure and reduce the bloating. If you had a lower endoscopy (such as a colonoscopy or flexible sigmoidoscopy) you may notice spotting of blood in your stool or on the toilet paper. If you underwent a bowel prep for your procedure, you may not have a normal bowel movement for a few days.  Please Note:  You might notice some irritation and congestion in your nose or some drainage.  This is from the oxygen used during your procedure.  There is no need for concern and it should clear up in a day or so.  SYMPTOMS TO REPORT IMMEDIATELY:   Following lower endoscopy (colonoscopy or flexible sigmoidoscopy):  Excessive amounts of blood in the stool  Significant tenderness or worsening of abdominal pains  Swelling of the abdomen that is new, acute  Fever of 100F or higher  For urgent or emergent issues, a gastroenterologist can be reached at any hour by calling [phone].   DIET: Your first meal following the procedure should be a small meal and then it is ok to progress to your normal  diet. Heavy or fried foods are harder to digest and may make you feel nauseous or bloated.  Likewise, meals heavy in dairy and vegetables can increase bloating.  Drink plenty of fluids but you should avoid alcoholic beverages for 24 hours.  ACTIVITY:  You should plan to take it easy for the rest of today and you should NOT DRIVE or use heavy machinery until tomorrow (because of the sedation medicines used during the test).    FOLLOW UP: Our staff will call the number listed on your records the next business day following your procedure to check on you and address any questions or concerns that you may have regarding the information given to you following your procedure. If we do not reach you, we will leave a message.  However, if you are feeling well and you are not experiencing any problems, there is no need to return our call.  We will assume that you have returned to your regular daily activities without incident.  If any biopsies were taken you will be contacted by phone or by letter within the next 1-3 weeks.  Please call us at [phone] if you have not heard about the biopsies in 3 weeks.    SIGNATURES/CONFIDENTIALITY: You and/or your care partner have signed paperwork which will be entered into your electronic medical record.  These signatures attest to the fact that that the information above on your After Visit  Summary has been reviewed and is understood.  Full responsibility of the confidentiality of this discharge information lies with you and/or your care-partner.  Constrast dye given  Barbera Setters will call pt with scheduled CT time per Dr. Carlean Purl

## 2015-05-08 NOTE — Progress Notes (Signed)
Pt awake and alert To pacu, report to RN

## 2015-05-08 NOTE — Progress Notes (Signed)
Called to room to assist during endoscopic procedure.  Patient ID and intended procedure confirmed with present staff. Received instructions for my participation in the procedure from the performing physician.  

## 2015-05-08 NOTE — Progress Notes (Signed)
Quick Note:  These are ok Please order CT abdomen with contrast (not pelvis) re: LUQ pain and tenderness - she has a contrast allergy but says she has taken benadryl before - i think she should do the protocol with prednisone and benadryl  We gave her oral contrast after procedures today ______

## 2015-05-09 ENCOUNTER — Telehealth: Payer: Self-pay | Admitting: *Deleted

## 2015-05-09 NOTE — Telephone Encounter (Signed)
No answer, message left for the patient. 

## 2015-05-10 ENCOUNTER — Other Ambulatory Visit: Payer: Self-pay

## 2015-05-10 DIAGNOSIS — R1012 Left upper quadrant pain: Secondary | ICD-10-CM

## 2015-05-10 MED ORDER — PREDNISONE 10 MG PO TABS: ORAL_TABLET | ORAL | Status: DC

## 2015-05-11 ENCOUNTER — Other Ambulatory Visit: Payer: Self-pay

## 2015-05-11 DIAGNOSIS — R1012 Left upper quadrant pain: Secondary | ICD-10-CM

## 2015-05-14 ENCOUNTER — Encounter: Payer: Self-pay | Admitting: Internal Medicine

## 2015-05-14 NOTE — Progress Notes (Signed)
Quick Note:  Hyperplastic - not precancerous polyp Repeat colon screening 10 yrs 2026-7 ______

## 2015-05-17 ENCOUNTER — Other Ambulatory Visit: Payer: Self-pay

## 2015-05-17 ENCOUNTER — Encounter (HOSPITAL_COMMUNITY): Payer: Self-pay

## 2015-05-17 ENCOUNTER — Ambulatory Visit (HOSPITAL_COMMUNITY)
Admission: RE | Admit: 2015-05-17 | Discharge: 2015-05-17 | Disposition: A | Discharge status: Home / Self Care | Payer: BLUE CROSS/BLUE SHIELD | Source: Ambulatory Visit | Attending: Internal Medicine | Admitting: Internal Medicine

## 2015-05-17 DIAGNOSIS — R1012 Left upper quadrant pain: Secondary | ICD-10-CM | POA: Diagnosis present

## 2015-05-17 DIAGNOSIS — K5641 Fecal impaction: Secondary | ICD-10-CM | POA: Diagnosis not present

## 2015-05-17 DIAGNOSIS — R918 Other nonspecific abnormal finding of lung field: Secondary | ICD-10-CM | POA: Insufficient documentation

## 2015-05-17 MED ORDER — IOHEXOL 300 MG/ML  SOLN
100.0000 mL | Freq: Once | INTRAMUSCULAR | Status: AC | PRN
  Administered 2015-05-17: 100 mL via INTRAVENOUS

## 2015-05-17 MED ORDER — DICYCLOMINE HCL 20 MG PO TABS: 20.0000 mg | ORAL_TABLET | Freq: Four times a day (QID) | ORAL | Status: DC

## 2015-05-17 NOTE — Progress Notes (Signed)
Quick Note:  No cause of abdominal pain seen here There is a lung lesion - small and probably not a problem but to be sure needs a chest CT w/o contrast re: lung lesion in RLL in 3 months - we should schedule that now or she can go back to PCP about that if she wants - need to be sure its not cancer  For abdominal pain - dicyclomine 20 mg every 6 hrs as needed # 60 5 RF  F/u me prn ______

## 2015-07-19 ENCOUNTER — Telehealth: Payer: Self-pay

## 2015-07-19 DIAGNOSIS — R911 Solitary pulmonary nodule: Secondary | ICD-10-CM

## 2015-07-19 NOTE — Telephone Encounter (Signed)
Left message for patient to call back  

## 2015-07-19 NOTE — Telephone Encounter (Signed)
-----   Message from Marlon Pel, RN sent at 05/17/2015  3:02 PM EST ----- Patient needs CT for Mid March- See CT results from 05/17/15

## 2015-07-19 NOTE — Telephone Encounter (Signed)
Patient is scheduled for CT chest only with out contrast for 08/22/15 8:30.  She is advised of instructions and appt details.

## 2015-08-17 ENCOUNTER — Other Ambulatory Visit: Payer: Self-pay | Admitting: Neurosurgery

## 2015-08-17 DIAGNOSIS — M5137 Other intervertebral disc degeneration, lumbosacral region: Secondary | ICD-10-CM

## 2015-08-22 ENCOUNTER — Ambulatory Visit (INDEPENDENT_AMBULATORY_CARE_PROVIDER_SITE_OTHER)
Admission: RE | Admit: 2015-08-22 | Discharge: 2015-08-22 | Disposition: A | Discharge status: Home / Self Care | Payer: BLUE CROSS/BLUE SHIELD | Source: Ambulatory Visit | Attending: Internal Medicine | Admitting: Internal Medicine

## 2015-08-22 DIAGNOSIS — J984 Other disorders of lung: Secondary | ICD-10-CM

## 2015-08-22 DIAGNOSIS — R911 Solitary pulmonary nodule: Secondary | ICD-10-CM

## 2015-08-23 ENCOUNTER — Ambulatory Visit
Admission: RE | Admit: 2015-08-23 | Discharge: 2015-08-23 | Disposition: A | Discharge status: Home / Self Care | Payer: BLUE CROSS/BLUE SHIELD | Source: Ambulatory Visit | Attending: Neurosurgery | Admitting: Neurosurgery

## 2015-08-23 DIAGNOSIS — M5137 Other intervertebral disc degeneration, lumbosacral region: Secondary | ICD-10-CM

## 2015-08-23 NOTE — Progress Notes (Signed)
Quick Note:  The tiny lung nodule remaons but has not grown which is good - goes against cancer Radiologist recommends repeating the chest CT in 1 year  Please mail copy of report to patient after talking to her and ask her to show her PCP Please also send copy to PCP  I would like them tofollow-up on this - let's make that clear  ______

## 2015-09-20 ENCOUNTER — Other Ambulatory Visit: Payer: Self-pay | Admitting: Obstetrics and Gynecology

## 2015-09-20 DIAGNOSIS — R921 Mammographic calcification found on diagnostic imaging of breast: Secondary | ICD-10-CM

## 2015-10-02 ENCOUNTER — Ambulatory Visit
Admission: RE | Admit: 2015-10-02 | Discharge: 2015-10-02 | Disposition: A | Discharge status: Home / Self Care | Payer: BLUE CROSS/BLUE SHIELD | Source: Ambulatory Visit | Attending: Obstetrics and Gynecology | Admitting: Obstetrics and Gynecology

## 2015-10-02 DIAGNOSIS — R921 Mammographic calcification found on diagnostic imaging of breast: Secondary | ICD-10-CM

## 2016-05-05 ENCOUNTER — Ambulatory Visit (INDEPENDENT_AMBULATORY_CARE_PROVIDER_SITE_OTHER): Payer: Self-pay

## 2016-05-05 ENCOUNTER — Encounter (INDEPENDENT_AMBULATORY_CARE_PROVIDER_SITE_OTHER): Payer: Self-pay | Admitting: Orthopedic Surgery

## 2016-05-05 ENCOUNTER — Ambulatory Visit (INDEPENDENT_AMBULATORY_CARE_PROVIDER_SITE_OTHER): Payer: BLUE CROSS/BLUE SHIELD | Admitting: Orthopedic Surgery

## 2016-05-05 DIAGNOSIS — M79671 Pain in right foot: Secondary | ICD-10-CM | POA: Diagnosis not present

## 2016-05-05 DIAGNOSIS — M25561 Pain in right knee: Secondary | ICD-10-CM | POA: Diagnosis not present

## 2016-05-05 DIAGNOSIS — M25562 Pain in left knee: Secondary | ICD-10-CM

## 2016-05-05 NOTE — Progress Notes (Signed)
Office Visit Note   Patient: [name] [name]           Date of Birth: [birth year]           MRN: [id number] Visit Date: 05/05/2016 Requested by: Luetta Nutting, DO [address] [place], [place] [address] PCP: Luetta Nutting, DO  Subjective: Chief Complaint  Patient presents with  . Right Foot - Pain, Injury  . Right Knee - Pain, Injury  . Left Knee - Pain, Injury    HPI [name] [name] is a 60 year old patient with bilateral knee pain and right foot pain.  She had bilateral cortisone injections 08/09/2015 without much relief.  She had a fall 04/09/2016 where her right knee gave way.  She had an impact injury after walking up 4 flights of stairs.  She's been recommended to have cardiac evaluation.  She hasn't done that yet.  Reports lateral and inferior patellar pain.  She had MRI scans which are essentially normal on both knees earlier this year.  She's had back surgery in the past.  She is in physical therapy doing dry needling.  Otherwise the patient is using Biofreeze heat ibuprofen Ultram and Flexeril              Review of Systems All systems reviewed are negative as they relate to the chief complaint within the history of present illness.  Patient denies  fevers or chills.    Assessment & Plan: Visit Diagnoses:  1. Acute pain of right knee   2. Acute pain of left knee   3. Pain in right foot     Plan: Impression is no bruising and contusion of the soft tissues in both knees in the right foot.  Radiographs are normal.  There is no effusion in the more symptomatic right knee.  No fractures present this is soft tissue injury which may be exacerbated her back nothing structural going on in either knee or the foot I will see her back as needed  Follow-Up Instructions: No Follow-up on file.   Orders:  Orders Placed This Encounter  Procedures  . XR Foot Complete Right  . XR KNEE 3 VIEW RIGHT  . XR KNEE 3 VIEW LEFT   No orders of the defined types were placed in this  encounter.     Procedures: No procedures performed   Clinical Data: No additional findings.  Objective: Vital Signs: There were no vitals taken for this visit.  Physical Exam  Constitutional: She appears well-developed.  HENT:  Head: Normocephalic.  Eyes: EOM are normal.  Neck: Normal range of motion.  Cardiovascular: Normal rate.   Pulmonary/Chest: Effort normal.  Neurological: She is alert.  Skin: Skin is warm.  Psychiatric: She has a normal mood and affect.   he does not like going to leave  Ortho Exam examination of both knees demonstrates full active and passive range of motion with mild patellofemoral crepitus.  No effusion on the right trace effusion on the left collateral cruciate ligaments stable no joint line tenderness is present mild tenderness present in the tibial tubercle region on the right.  Right foot is examined.  Poppell intact nontender anterior to posterior tib peroneal and Achilles tendon.  Symmetric range of motion of the foot in the tibiotalar subtalar transverse tarsal joints are noted.  No other masses lymph adenopathy or skin changes noted in the right foot region  Specialty Comments:  No specialty comments available.  Imaging: No results found.   PMFS History: Patient Active  Problem List   Diagnosis Date Noted  . HNP (herniated nucleus pulposus), lumbar 02/14/2015  . ALLERGY, FOOD 07/06/2008  . COSTOCHONDRITIS 07/06/2008  . ALLERGY 07/06/2008  . MIGRAINE HEADACHE 07/23/2007  . GERD 07/23/2007  . IRRITABLE BOWEL SYNDROME 07/23/2007  . OSTEOPENIA 07/23/2007   Past Medical History:  Diagnosis Date  . Arthritis    left foot  . Bronchitis   . Complication of anesthesia    Patient woke up during bunionectomy and breast cyst removal surgery  . Diabetes mellitus without complication ([place])   . Environmental and seasonal allergies   . GERD (gastroesophageal reflux disease)   . Glaucoma    Bilateral  . Hyperlipemia   . Hypertension   .  Optic nerve disorder    left eye  . Pneumonia 2014  . PONV (postoperative nausea and vomiting)    Slight nausea  . Vitamin D deficiency     Family History  Problem Relation Age of Onset  . Colon cancer Neg Hx   . Stomach cancer Neg Hx   . Esophageal cancer Neg Hx     Past Surgical History:  Procedure Laterality Date  . ABDOMINAL HYSTERECTOMY    . BACK SURGERY     X 4  . BREAST CYST EXCISION Bilateral   . BUNIONECTOMY Bilateral   . COLONOSCOPY    . FINGER SURGERY Left    Thumb   Social History   Occupational History  . accounting    Social History Main Topics  . Smoking status: Never Smoker  . Smokeless tobacco: Never Used  . Alcohol use 0.0 oz/week     Comment: once a year  . Drug use: No  . Sexual activity: Not on file

## 2016-05-13 ENCOUNTER — Other Ambulatory Visit: Payer: Self-pay | Admitting: Obstetrics and Gynecology

## 2016-05-13 DIAGNOSIS — R921 Mammographic calcification found on diagnostic imaging of breast: Secondary | ICD-10-CM

## 2016-05-28 ENCOUNTER — Ambulatory Visit
Admission: RE | Admit: 2016-05-28 | Discharge: 2016-05-28 | Disposition: A | Discharge status: Home / Self Care | Payer: BLUE CROSS/BLUE SHIELD | Source: Ambulatory Visit | Attending: Obstetrics and Gynecology | Admitting: Obstetrics and Gynecology

## 2016-05-28 DIAGNOSIS — R921 Mammographic calcification found on diagnostic imaging of breast: Secondary | ICD-10-CM

## 2016-07-14 ENCOUNTER — Other Ambulatory Visit: Payer: Self-pay | Admitting: Neurosurgery

## 2016-07-14 DIAGNOSIS — M5136 Other intervertebral disc degeneration, lumbar region: Secondary | ICD-10-CM

## 2016-07-22 ENCOUNTER — Ambulatory Visit
Admission: RE | Admit: 2016-07-22 | Discharge: 2016-07-22 | Disposition: A | Discharge status: Home / Self Care | Payer: BLUE CROSS/BLUE SHIELD | Source: Ambulatory Visit | Attending: Neurosurgery | Admitting: Neurosurgery

## 2016-07-22 DIAGNOSIS — M5136 Other intervertebral disc degeneration, lumbar region: Secondary | ICD-10-CM

## 2016-07-31 IMAGING — MG MM DIAGNOSTIC BILATERAL
5 series · 5 of 5 positions shown · non-contrast
Comparison: With priors

CLINICAL DATA: Annual exam in 1 year followup of probably benign
postsurgical scarring in the outer right breast. Patient underwent
benign excisional biopsy in 6555.

EXAM:
DIGITAL DIAGNOSTIC  BILATERAL MAMMOGRAM WITH CAD

[R CC]
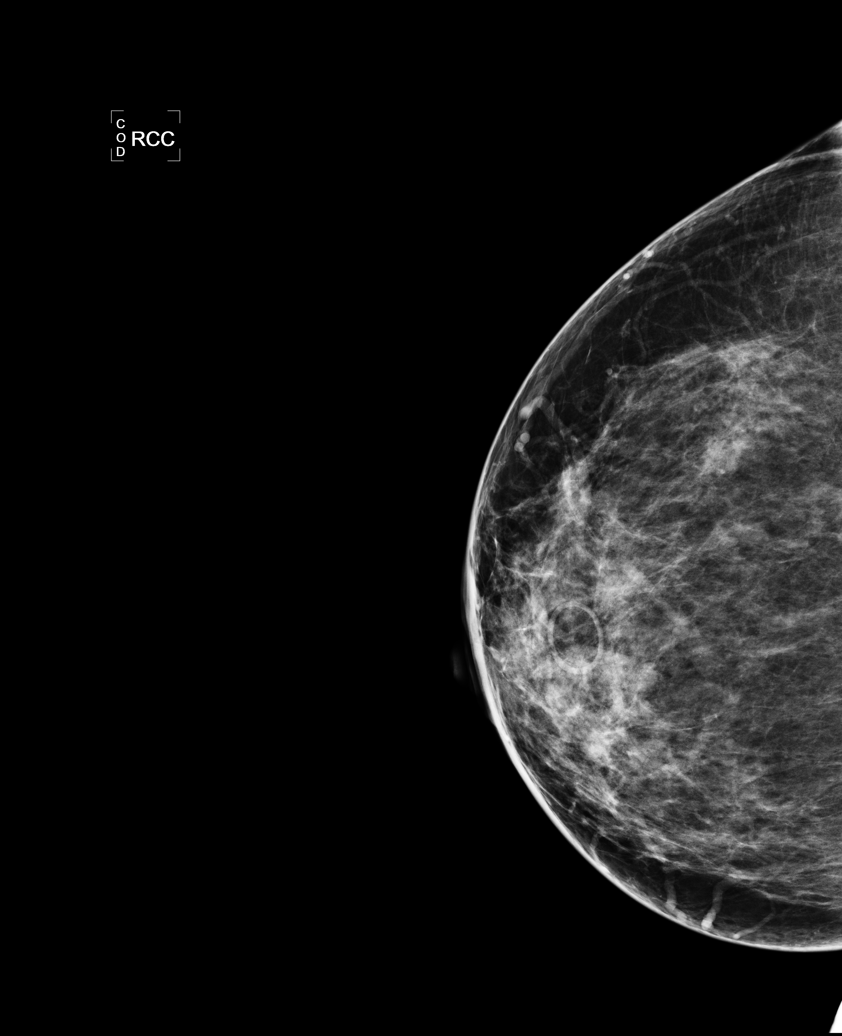

[L CC (1 of 2)]
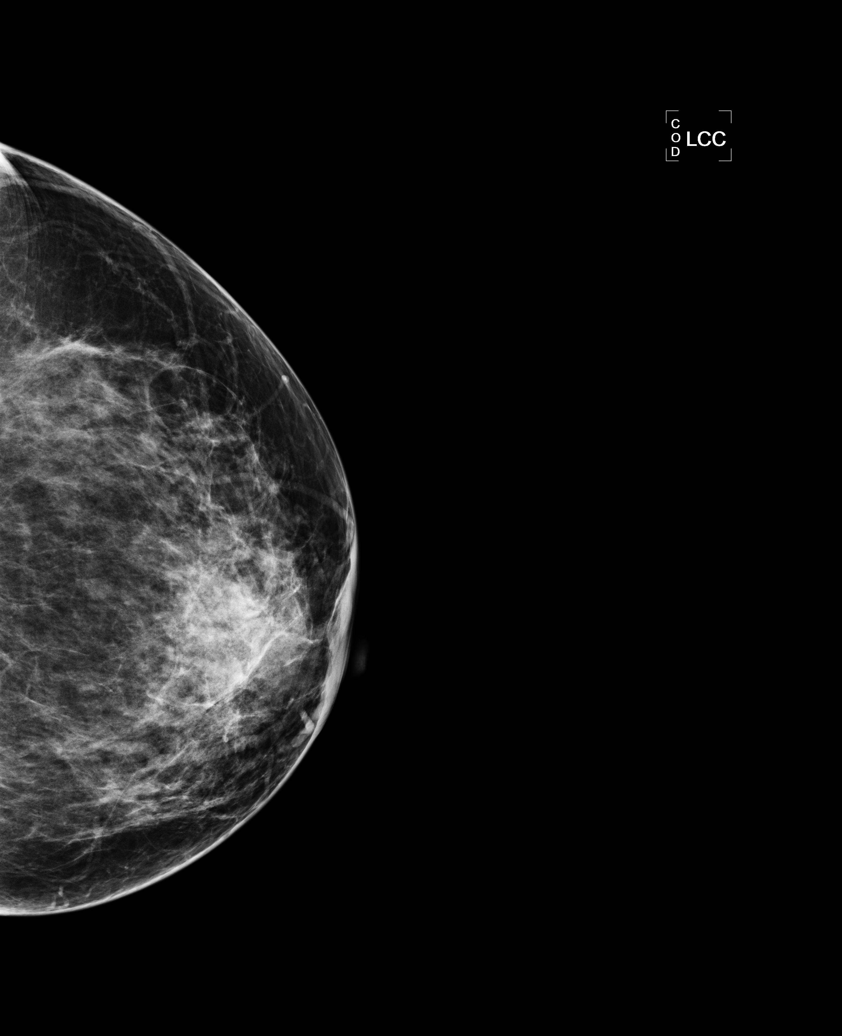

[L MLO]
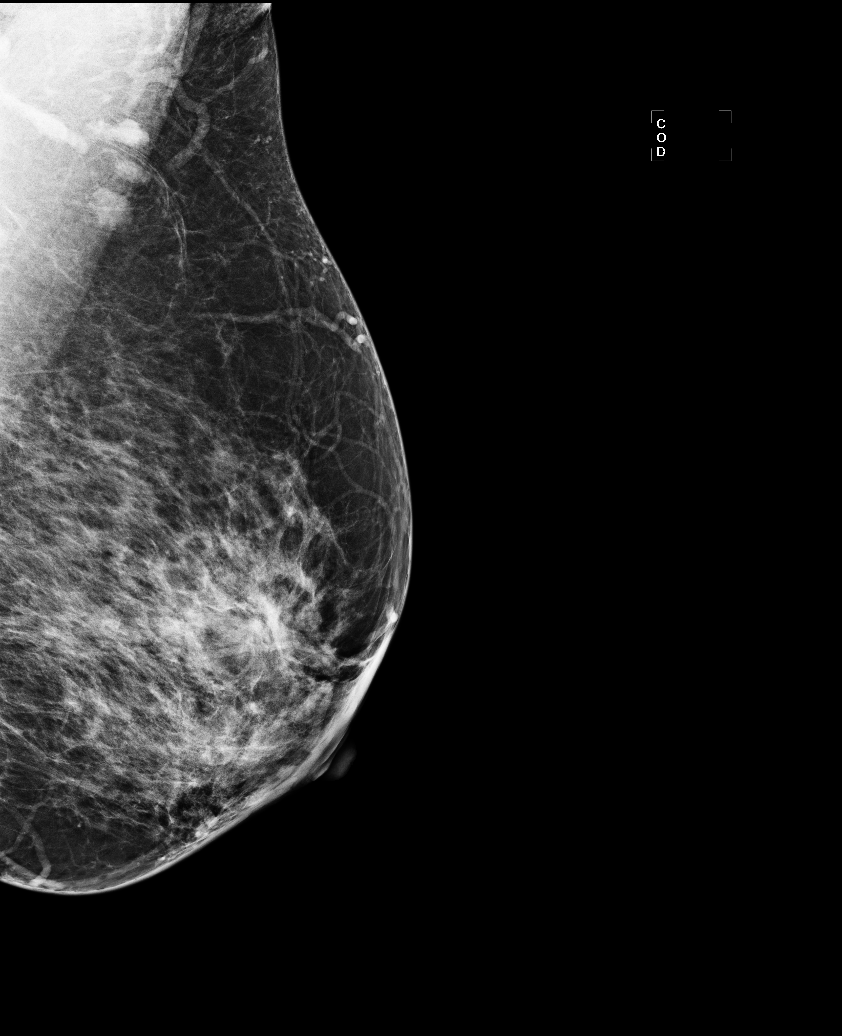

[R MLO]
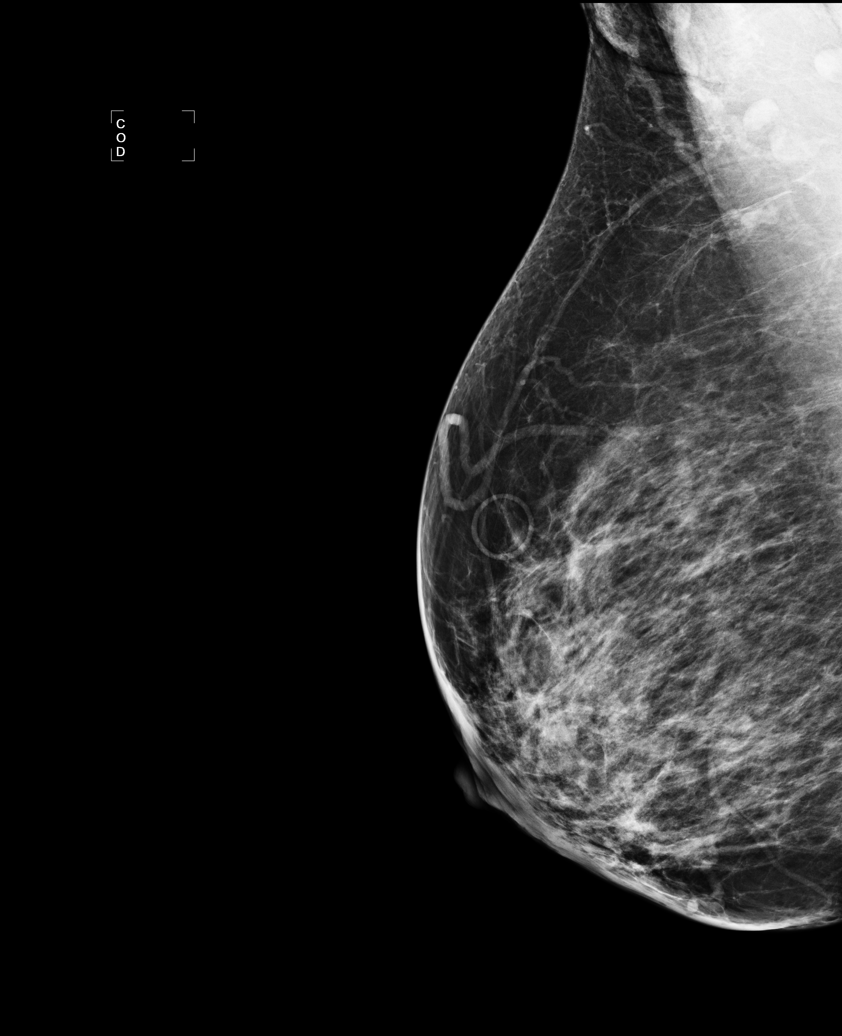

[L CC (2 of 2)]
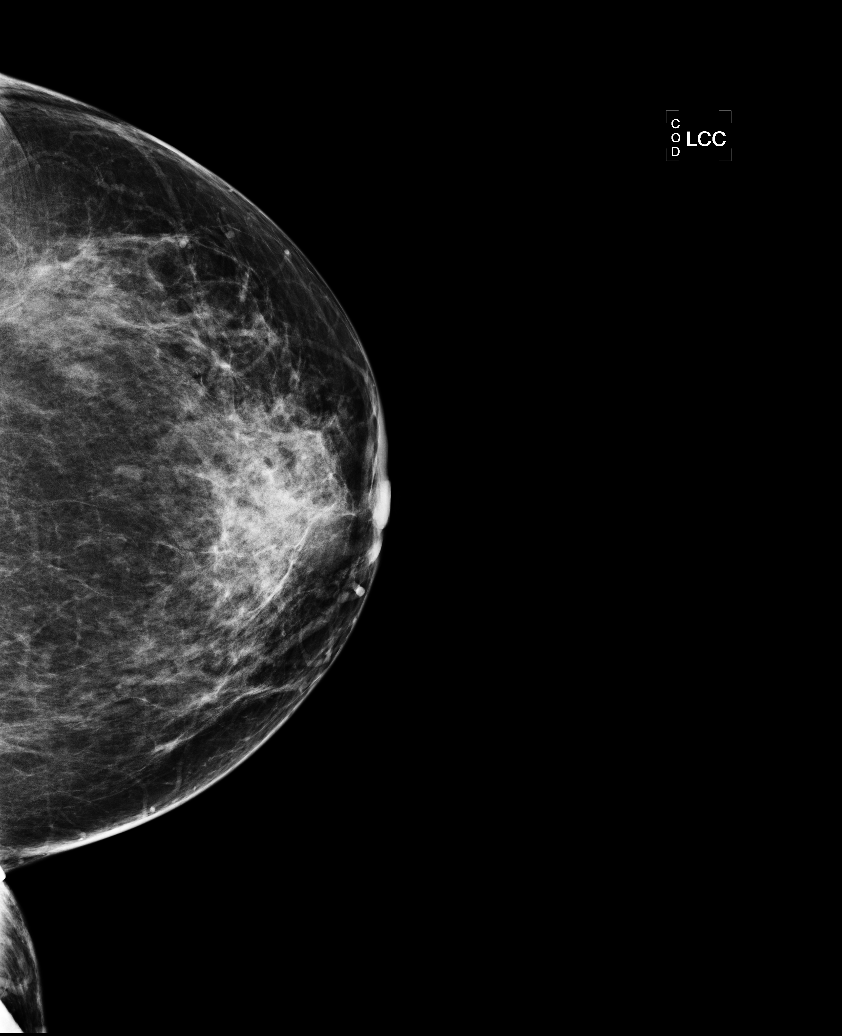

[5 of 5 positions shown; findings below may reference images not displayed]

ACR Breast Density Category c: The breast tissue is heterogeneously
dense, which may obscure small masses.
FINDINGS: Stable slight architectural distortion in the outer right breast.
Appears similar dating back to a mammogram of 9225. No mass,
nonsurgical distortion or suspicious microcalcification is
identified in either breast to suggest malignancy.

Mammographic images were processed with CAD.
IMPRESSION: No evidence of malignancy in either breast. Stable postsurgical
scarring outer right breast.

RECOMMENDATION:
Screening mammogram in one year.(Code:KC-[id number])

I have discussed the findings and recommendations with the patient.
Results were also provided in writing at the conclusion of the
visit. If applicable, a reminder letter will be sent to the patient
regarding the next appointment.

BI-RADS CATEGORY  2: Benign Finding(s)

## 2017-02-03 ENCOUNTER — Other Ambulatory Visit: Payer: Self-pay | Admitting: Obstetrics and Gynecology

## 2017-02-03 DIAGNOSIS — E2839 Other primary ovarian failure: Secondary | ICD-10-CM

## 2017-02-05 ENCOUNTER — Ambulatory Visit
Admission: RE | Admit: 2017-02-05 | Discharge: 2017-02-05 | Disposition: A | Discharge status: Home / Self Care | Payer: BLUE CROSS/BLUE SHIELD | Source: Ambulatory Visit | Attending: Obstetrics and Gynecology | Admitting: Obstetrics and Gynecology

## 2017-02-05 DIAGNOSIS — E2839 Other primary ovarian failure: Secondary | ICD-10-CM

## 2017-03-22 IMAGING — CT CT BIOPSY
1 of 3 series · 10 of 28 positions shown, 16 images · non-contrast
Comparison: none

CLINICAL DATA: Sacroiliitis.

[Series 2: si inj 5.0 b70s · axial · 0.92mm/px · z∈[-70,-26]mm · 10 of 12 slices shown, 16 images]
[im 2/12  soft-tissue]
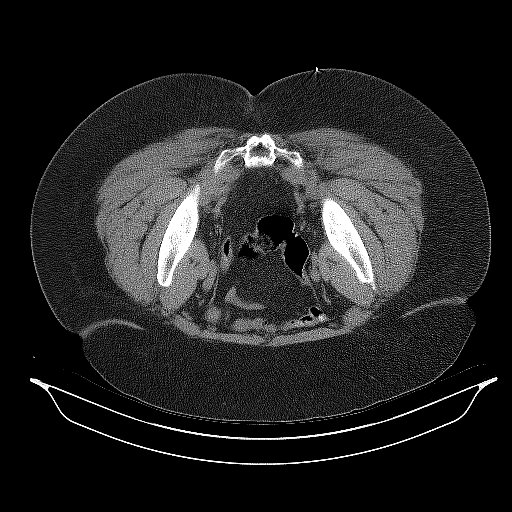
[im 2/12  bone]
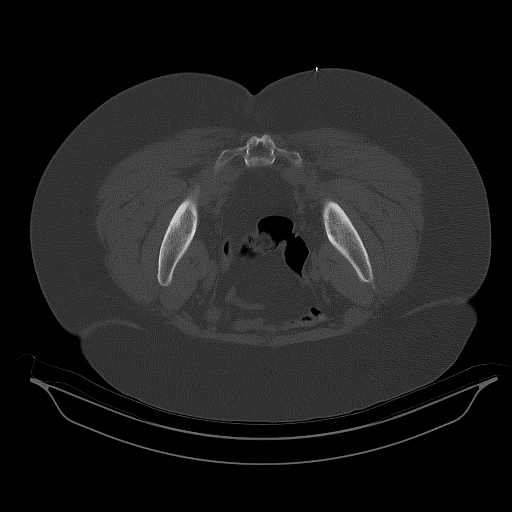
[im 3/12  soft-tissue]
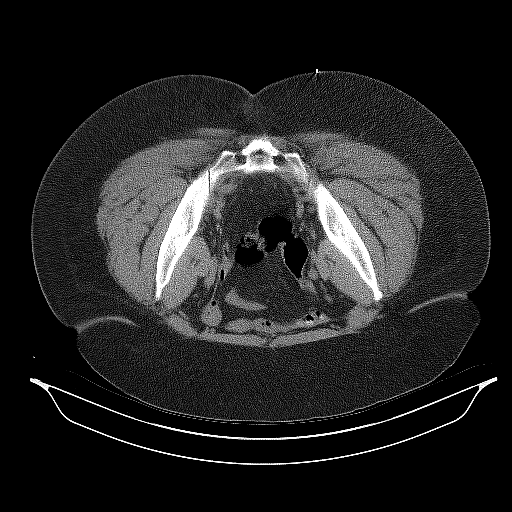
[im 4/12  soft-tissue]
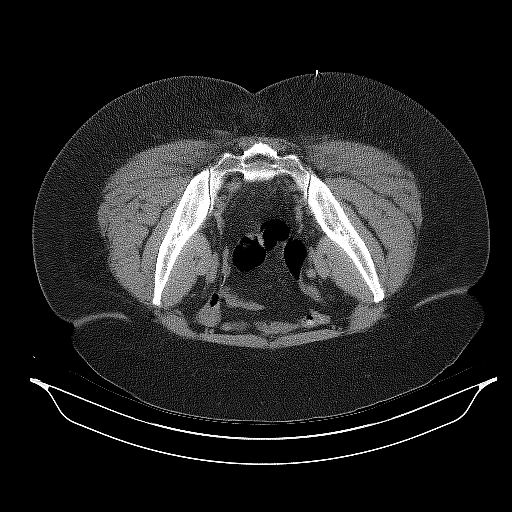
[im 5/12  soft-tissue]
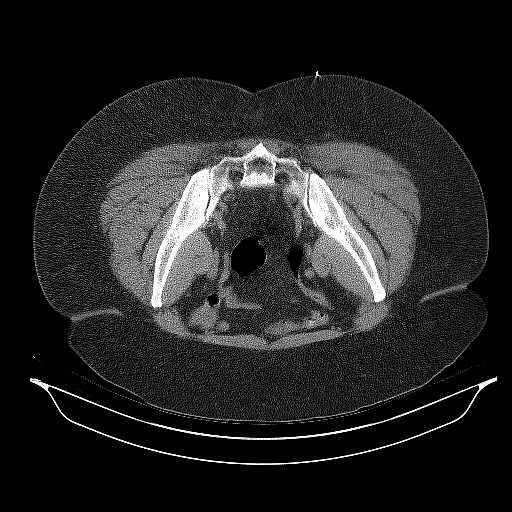
[im 6/12  soft-tissue]
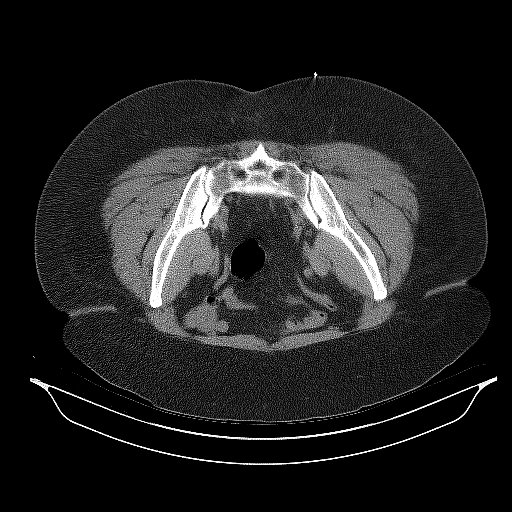
[im 7/12  soft-tissue]
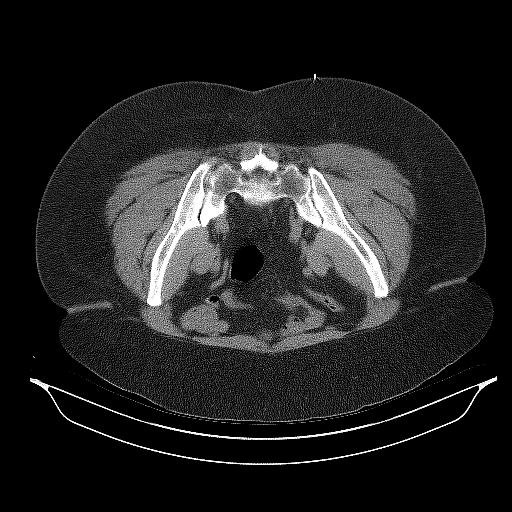
[im 8/12  soft-tissue]
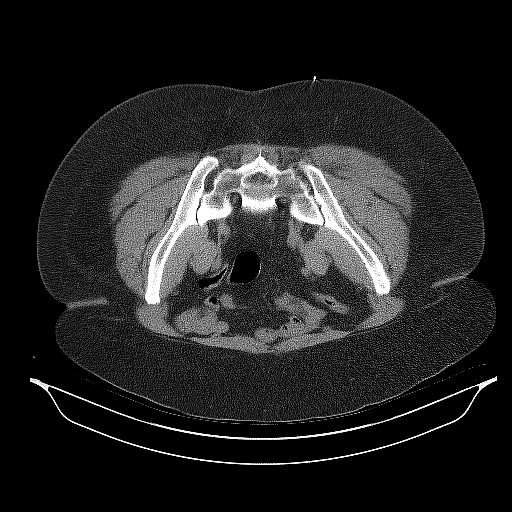
[im 8/12  lung]
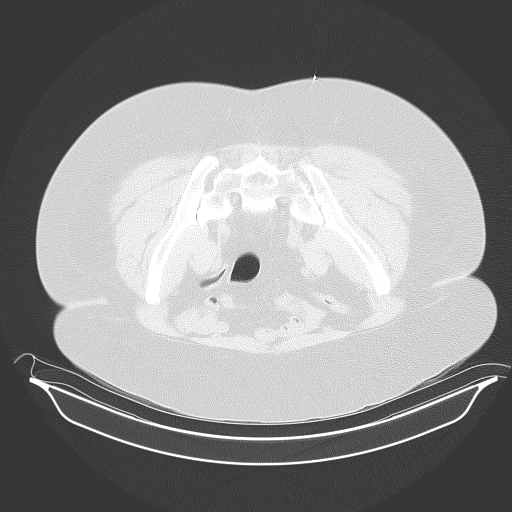
[im 9/12  soft-tissue]
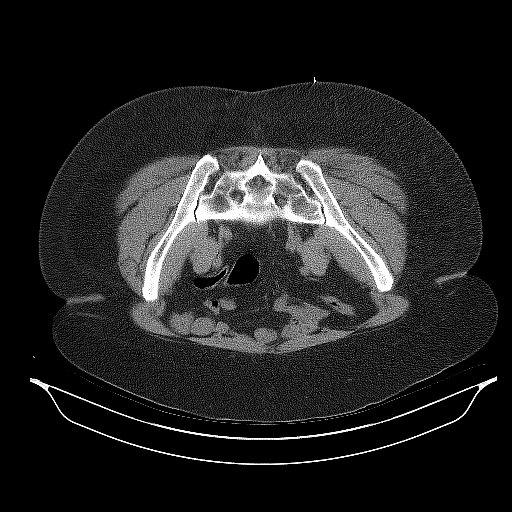
[im 9/12  lung]
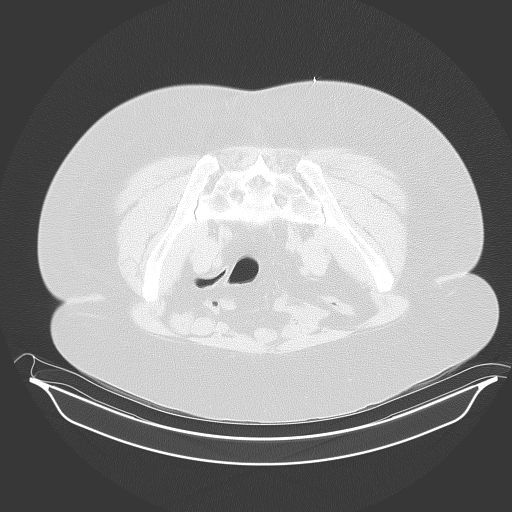
[im 10/12  soft-tissue]
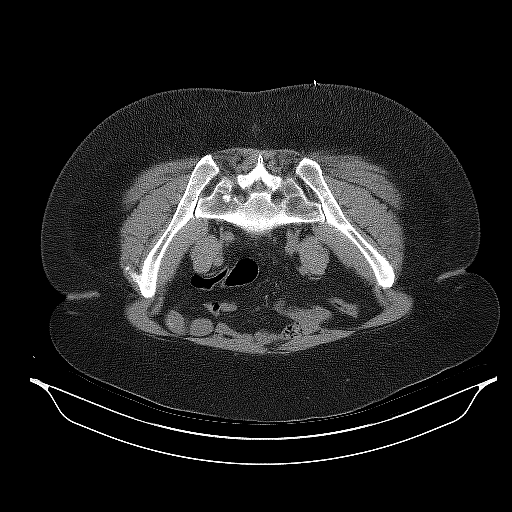
[im 10/12  lung]
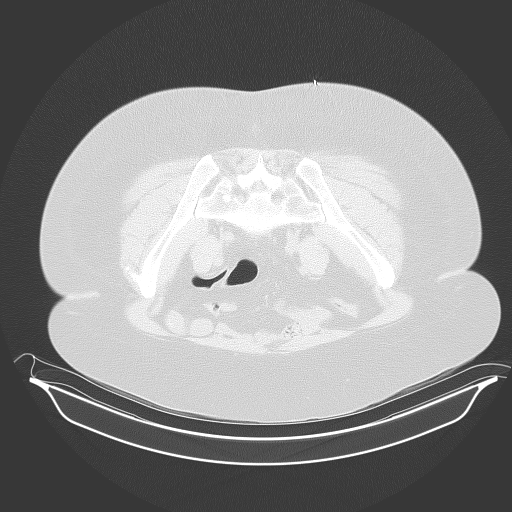
[im 10/12  bone]
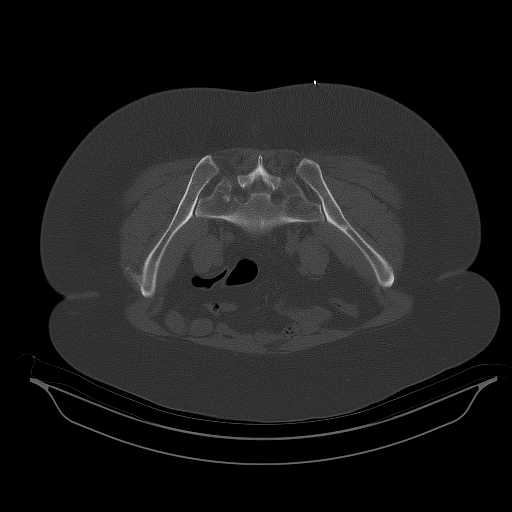
[im 11/12  soft-tissue]
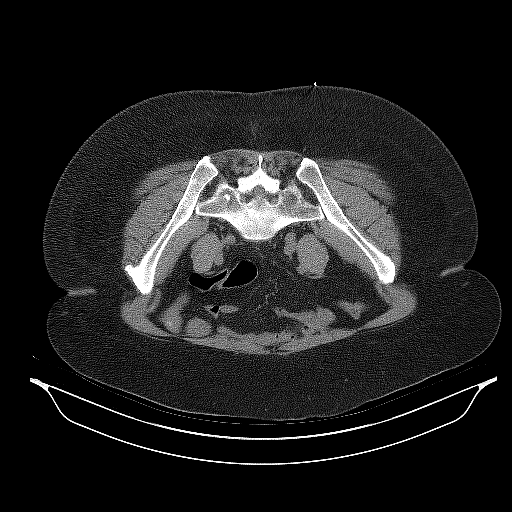
[im 11/12  lung]
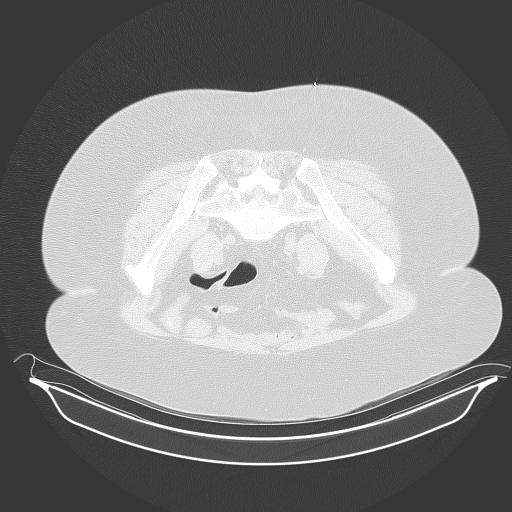

[10 of 28 positions shown; findings below may reference images not displayed]

EXAM:
CT GUIDED left SI joint injection

ANESTHESIA/SEDATION:
Local anesthesia

PROCEDURE:
The procedure risks, benefits, and alternatives were explained to
the patient. Questions regarding the procedure were encouraged and
answered. The patient understands and consents to the procedure.

The left posterior pelvis was prepped with Betadinein a sterile
fashion, and a sterile drape was applied covering the operative
field. A sterile gown and sterile gloves were used for the
procedure. Local anesthesia was provided with 1% Lidocaine.

An appropriate posterior entrance site was selected. The superficial
soft tissues were anesthetized with 1% lidocaine. A 6 inch 22 gauge
needle was then advanced into the left SI joint under intermittent
CT guidance.

0.5 mL of Omnipaque 180 was injected to confirm placement in the
joint.

I then injected 120 mg Depo-Medrol and 1.5 mL 1% lidocaine

Complications: None
FINDINGS: The patient had some concordant pain with the injection. It did not
replicate her more lateral hip pain.
IMPRESSION: Technically successful CT-guided left SI joint injection.

## 2017-04-16 ENCOUNTER — Other Ambulatory Visit: Payer: Self-pay | Admitting: Obstetrics and Gynecology

## 2017-04-16 DIAGNOSIS — Z139 Encounter for screening, unspecified: Secondary | ICD-10-CM

## 2017-05-29 ENCOUNTER — Ambulatory Visit
Admission: RE | Admit: 2017-05-29 | Discharge: 2017-05-29 | Disposition: A | Discharge status: Home / Self Care | Payer: BLUE CROSS/BLUE SHIELD | Source: Ambulatory Visit | Attending: Obstetrics and Gynecology | Admitting: Obstetrics and Gynecology

## 2017-05-29 ENCOUNTER — Other Ambulatory Visit: Payer: Self-pay | Admitting: Obstetrics and Gynecology

## 2017-05-29 DIAGNOSIS — R921 Mammographic calcification found on diagnostic imaging of breast: Secondary | ICD-10-CM

## 2017-05-29 DIAGNOSIS — Z139 Encounter for screening, unspecified: Secondary | ICD-10-CM

## 2017-06-02 HISTORY — PX: CATARACT EXTRACTION, BILATERAL: SHX1313

## 2017-06-10 ENCOUNTER — Ambulatory Visit
Admission: RE | Admit: 2017-06-10 | Discharge: 2017-06-10 | Disposition: A | Discharge status: Home / Self Care | Payer: BLUE CROSS/BLUE SHIELD | Source: Ambulatory Visit | Attending: Obstetrics and Gynecology | Admitting: Obstetrics and Gynecology

## 2017-06-10 DIAGNOSIS — R921 Mammographic calcification found on diagnostic imaging of breast: Secondary | ICD-10-CM

## 2017-07-16 ENCOUNTER — Other Ambulatory Visit: Payer: Self-pay | Admitting: Neurosurgery

## 2017-07-16 DIAGNOSIS — G939 Disorder of brain, unspecified: Secondary | ICD-10-CM

## 2017-07-30 ENCOUNTER — Ambulatory Visit
Admission: RE | Admit: 2017-07-30 | Discharge: 2017-07-30 | Disposition: A | Discharge status: Home / Self Care | Payer: BLUE CROSS/BLUE SHIELD | Source: Ambulatory Visit | Attending: Neurosurgery | Admitting: Neurosurgery

## 2017-07-30 DIAGNOSIS — G939 Disorder of brain, unspecified: Secondary | ICD-10-CM

## 2017-07-30 MED ORDER — GADOBENATE DIMEGLUMINE 529 MG/ML IV SOLN
19.0000 mL | Freq: Once | INTRAVENOUS | Status: AC | PRN
  Administered 2017-07-30: 19 mL via INTRAVENOUS

## 2017-09-11 ENCOUNTER — Encounter: Payer: Self-pay | Admitting: Neurology

## 2017-09-11 ENCOUNTER — Encounter (INDEPENDENT_AMBULATORY_CARE_PROVIDER_SITE_OTHER): Payer: Self-pay

## 2017-09-11 ENCOUNTER — Ambulatory Visit (INDEPENDENT_AMBULATORY_CARE_PROVIDER_SITE_OTHER): Payer: BLUE CROSS/BLUE SHIELD | Admitting: Neurology

## 2017-09-11 VITALS — BP 126/75 | HR 93 | Ht 66.0 in | Wt 208.0 lb

## 2017-09-11 DIAGNOSIS — R7309 Other abnormal glucose: Secondary | ICD-10-CM | POA: Diagnosis not present

## 2017-09-11 DIAGNOSIS — R0683 Snoring: Secondary | ICD-10-CM

## 2017-09-11 DIAGNOSIS — I6381 Other cerebral infarction due to occlusion or stenosis of small artery: Secondary | ICD-10-CM | POA: Diagnosis not present

## 2017-09-11 NOTE — Progress Notes (Signed)
[facility]    Provider:  Dr Jaynee Eagles Referring Provider: Dr. Glendon Axe Primary Care Physician:  [facility], Dr. Glendon Axe  CC:  Abnormal MRI  HPI:  [name] [name] is a 62 y.o. female here as a referral from Dr. Criss Rosales for abnormal MRI.  Past medical history hyperlipidemia, diabetes, HTN, migraine, IBS. Patient was having neck pain and had mri cervical and saw something in the brain and a follow up was initiated. No difficulties with memory or balance, she has back pain and symptoms related to that but no significant balance issues or memory issues. No onset of symptoms, no acute onset of paresthesias however she does have problems in the legs due to back. She went due to problems in the arm, have been going on for the last 6 months, worsening with neck pain. Her grip on the right is worsening. She snores and stops breathing in her sleep.  Reviewed notes, labs and imaging from outside physicians, which showed:  Reviewed referring physician notes for lesion of the brainstem.  MRI of the brain was performed by [facility].  Overall she remains asymptomatic.  She has chronic neck and back pain and she is doing physical therapy and working with pain management.  Reviewed thorough complete neurologic exam which was nonfocal.  MRI of the brain shows increased T2 signal in her brainstem with what appears to be expansion of the pons and small vessel ischemic changes throughout the white matter cerebral hemispheres.  Personally reviewed MRI brain images and agree with the following:  IMPRESSION: Pontine signal abnormality on previous cervical MRI is most likely chronic small vessel ischemia, which is also seen to a moderate degree in the cerebral white matter. There is remote lacunar infarct in the left thalamus. If there are bulbar symptoms a follow-up could be obtained to document stability.  [id number] 6 2016  Review of Systems: Patient complains of  symptoms per HPI as well as the following symptoms:fatigue . Pertinent negatives and positives per HPI. All others negative.   Social History   Socioeconomic History  . Marital status: Married    Spouse name: Not on file  . Number of children: 2  . Years of education: Not on file  . Highest education level: Not on file  Occupational History  . Occupation: Press photographer  Social Needs  . Financial resource strain: Not on file  . Food insecurity:    Worry: Not on file    Inability: Not on file  . Transportation needs:    Medical: Not on file    Non-medical: Not on file  Tobacco Use  . Smoking status: Never Smoker  . Smokeless tobacco: Never Used  Substance and Sexual Activity  . Alcohol use: Yes    Alcohol/week: 0.0 oz    Comment: once a year  . Drug use: No  . Sexual activity: Not on file  Lifestyle  . Physical activity:    Days per week: Not on file    Minutes per session: Not on file  . Stress: Not on file  Relationships  . Social connections:    Talks on phone: Not on file    Gets together: Not on file    Attends religious service: Not on file    Active member of club or organization: Not on file    Attends meetings of clubs or organizations: Not on file    Relationship status: Not on file  . Intimate partner violence:    Fear of  current or ex partner: Not on file    Emotionally abused: Not on file    Physically abused: Not on file    Forced sexual activity: Not on file  Other Topics Concern  . Not on file  Social History Narrative   Married. She has 2 daughters. She works in Press photographer. No caffeine.   Last update 03/07/2015.    Family History  Problem Relation Age of Onset  . Colon cancer Neg Hx   . Stomach cancer Neg Hx   . Esophageal cancer Neg Hx     Past Medical History:  Diagnosis Date  . Arthritis    left foot  . Bronchitis   . Complication of anesthesia    Patient woke up during bunionectomy and breast cyst removal surgery  . Diabetes  mellitus without complication ([place])   . Environmental and seasonal allergies   . GERD (gastroesophageal reflux disease)   . Glaucoma    Bilateral  . Hyperlipemia   . Hypertension   . Optic nerve disorder    left eye  . Pneumonia 2014  . PONV (postoperative nausea and vomiting)    Slight nausea  . Vitamin D deficiency     Past Surgical History:  Procedure Laterality Date  . ABDOMINAL HYSTERECTOMY    . BACK SURGERY     X 4  . BREAST CYST EXCISION Bilateral    20+ years ago  . BUNIONECTOMY Bilateral   . COLONOSCOPY    . FINGER SURGERY Left    Thumb    Current Outpatient Medications  Medication Sig Dispense Refill  . Amlodipine-Valsartan-HCTZ 5-160-12.5 MG TABS Take 1 tablet by mouth at bedtime.    Marland Kitchen atorvastatin (LIPITOR) 40 MG tablet Take 40 mg by mouth daily.    Marland Kitchen estradiol (ESTRACE) 1 MG tablet Take 1 mg by mouth daily.    . fenofibrate (TRICOR) 145 MG tablet Take 145 mg by mouth daily.    . fluticasone (FLONASE) 50 MCG/ACT nasal spray Place 1 spray into both nostrils daily.    Marland Kitchen gabapentin (NEURONTIN) 300 MG capsule Take 300 mg by mouth at bedtime.    Marland Kitchen ibuprofen (ADVIL,MOTRIN) 800 MG tablet Take 800 mg by mouth every 8 (eight) hours as needed for moderate pain.    Marland Kitchen latanoprost (XALATAN) 0.005 % ophthalmic solution Place 1 drop into both eyes at bedtime.    . montelukast (SINGULAIR) 10 MG tablet Take 10 mg by mouth at bedtime.    . potassium chloride SA (K-DUR,KLOR-CON) 20 MEQ tablet Take 20 mEq by mouth daily.    . SitaGLIPtin-MetFORMIN HCl [phone] MG TB24 Take 1 tablet by mouth at bedtime.    Marland Kitchen aspirin EC 325 MG tablet Take 1 tablet (325 mg total) by mouth daily. 30 tablet 0  . carisoprodol (SOMA) 350 MG tablet Take 350 mg by mouth 4 (four) times daily as needed for muscle spasms.    . cyclobenzaprine (FLEXERIL) 5 MG tablet Take 5 mg by mouth 3 (three) times daily as needed for muscle spasms.    . diphenhydrAMINE (BENADRYL) 25 mg capsule Take 25 mg by mouth at bedtime  as needed.    Marland Kitchen EPINEPHRINE, ANAPHYLAXIS THERAPY AGENTS, Inject 0.3 mg into the muscle.    . meloxicam (MOBIC) 15 MG tablet Take 15 mg by mouth daily.    . traMADol (ULTRAM) 50 MG tablet Take 50 mg by mouth every 6 (six) hours as needed for pain.     No current facility-administered medications for this visit.  Allergies as of 09/11/2017 - Review Complete 09/11/2017  Allergen Reaction Noted  . Adhesive [tape] Dermatitis 02/04/2013  . Latex Dermatitis 02/04/2013  . Codeine Nausea And Vomiting 07/06/2008  . Erythromycin Itching and Swelling 07/06/2008  . Amoxicillin Diarrhea 02/07/2015  . Azithromycin  09/10/2017  . Macrolides and ketolides  09/10/2017  . Other  02/07/2015  . Peanut-containing drug products  03/07/2015  . Potassium clavulanate [clavulanic acid]  09/10/2017  . Augmentin [amoxicillin-pot clavulanate] Diarrhea 05/19/2014  . Iodinated diagnostic agents Itching 02/04/2013    Vitals: BP 126/75 (BP Location: Right Arm, Patient Position: Sitting)   Pulse 93   Ht 5\' 6"  (1.676 m)   Wt 208 lb (94.3 kg)   BMI 33.57 kg/m  Last Weight:  Wt Readings from Last 1 Encounters:  09/11/17 208 lb (94.3 kg)   Last Height:   Ht Readings from Last 1 Encounters:  09/11/17 5\' 6"  (1.676 m)    Physical exam: Exam: Gen: NAD, conversant, well nourised, well groomed                     CV: RRR, no MRG. No Carotid Bruits. No peripheral edema, warm, nontender Eyes: Conjunctivae clear without exudates or hemorrhage  Neuro: Detailed Neurologic Exam  Speech:    Speech is normal; fluent and spontaneous with normal comprehension.  Cognition:    The patient is oriented to person, place, and time;     recent and remote memory intact;     language fluent;     normal attention, concentration,     fund of knowledge Cranial Nerves:    The pupils are equal, round, and reactive to light. Attempted findoscopic exam could not visualize due to small pupils.. Visual fields are full to finger  confrontation. Extraocular movements are intact. Trigeminal sensation is intact and the muscles of mastication are normal. The face is symmetric. The palate elevates in the midline. Hearing intact. Voice is normal. Shoulder shrug is normal. The tongue has normal motion without fasciculations.   Coordination:    Normal finger to nose and heel to shin.    Gait:    Heel-toe and tandem gait are normal.   Motor Observation:    No asymmetry, no atrophy, and no involuntary movements noted. Tone:    Normal muscle tone.    Posture:    Posture is normal. normal erect    Strength:    Strength is V/V in the upper and lower limbs.      Sensation: intact to LT     Reflex Exam:  DTR's:    Deep tendon reflexes in the upper and lower extremities are normal bilaterally.   Toes:    The toes are downgoing bilaterally.   Clonus:    Clonus is absent.      Assessment/Plan: This is a 62 year old patient who was referred for abnormal MRI.  Past medical history includes hyperlipidemia, diabetes, hypertension, migraine, IBS.  MRI of the brain showed chronic small vessel ischemia and a remote lacunar infarct in the left thalamus.  This is likely nonspecific white matter changes due to her past medical history which includes multiple vascular risk factors as above.  I did discuss with patient that the lacunar infarct likely due to small vessel disease.  We will repeat in 6 months to a year to follow progression.  - Repeat MRI brain in 54months to a year -Patient is obese, snores with witnessed apneic events and is also had a stroke so she needs sleep  evaluation for obstructive sleep apnea -Referred to healthy weight and wellness center here at [facility] Dr. Leafy Ro for her obesity - Excellent article to read: [email or web address] Needs to tightly control vascular risk factors as discussed.  I had a long d/w patient about her remote stroke, risk for recurrent  stroke/TIAs, personally independently reviewed imaging studies and stroke evaluation results and answered questions.Continue ASA 325 for secondary stroke prevention and maintain strict control of hypertension with blood pressure goal below 130/90, diabetes with hemoglobin A1c goal below 6.5% and lipids with LDL cholesterol goal below 70 mg/dL. I also advised the patient to eat a healthy diet with plenty of whole grains, cereals, fruits and vegetables, exercise regularly and maintain ideal body weight .  Orders Placed This Encounter  Procedures  . Ambulatory referral to Sleep Studies  . Ambulatory referral to Family Practice    Cc: Dr. Tamala Julian and Dr. Richrd Sox, [facility] [address] [place], [place] [address]  Phone [phone] Fax [phone]

## 2017-09-11 NOTE — Patient Instructions (Addendum)
Excellent article to read: [email or web address]  [facility] Repeat MRI brain in 6 months to a year  I had a long d/w patient about her remote stroke, risk for recurrent stroke/TIAs, personally independently reviewed imaging studies and stroke evaluation results and answered questions.Continue Aspirin 325 for secondary stroke prevention and maintain strict control of hypertension with blood pressure goal below 130/90, diabetes with hemoglobin A1c goal below 6.5% and lipids with LDL cholesterol goal below 70 mg/dL.. I also advised the patient to eat a healthy diet with plenty of whole grains, cereals, fruits and vegetables, exercise regularly and maintain ideal body weight .   Stroke Prevention Some medical conditions and behaviors are associated with a higher chance of having a stroke. You can help prevent a stroke by making nutrition, lifestyle, and other changes, including managing any medical conditions you may have. What nutrition changes can be made?  Eat healthy foods. You can do this by: ? Choosing foods high in fiber, such as fresh fruits and vegetables and whole grains. ? Eating at least 5 or more servings of fruits and vegetables a day. Try to fill half of your plate at each meal with fruits and vegetables. ? Choosing lean protein foods, such as lean cuts of meat, poultry without skin, fish, tofu, beans, and nuts. ? Eating low-fat dairy products. ? Avoiding foods that are high in salt (sodium). This can help lower blood pressure. ? Avoiding foods that have saturated fat, trans fat, and cholesterol. This can help prevent high cholesterol. ? Avoiding processed and premade foods.  Follow your health care provider's specific guidelines for losing weight, controlling high blood pressure (hypertension), lowering high cholesterol, and managing diabetes. These may include: ? Reducing your daily calorie intake. ? Limiting  your daily sodium intake to 1,500 milligrams (mg). ? Using only healthy fats for cooking, such as olive oil, canola oil, or sunflower oil. ? Counting your daily carbohydrate intake. What lifestyle changes can be made?  Maintain a healthy weight. Talk to your health care provider about your ideal weight.  Get at least 30 minutes of moderate physical activity at least 5 days a week. Moderate activity includes brisk walking, biking, and swimming.  Do not use any products that contain nicotine or tobacco, such as cigarettes and e-cigarettes. If you need help quitting, ask your health care provider. It may also be helpful to avoid exposure to secondhand smoke.  Limit alcohol intake to no more than 1 drink a day for nonpregnant women and 2 drinks a day for men. One drink equals 12 oz of beer, 5 oz of wine, or 1 oz of hard liquor.  Stop any illegal drug use.  Avoid taking birth control pills. Talk to your health care provider about the risks of taking birth control pills if: ? You are over 28 years old. ? You smoke. ? You get migraines. ? You have ever had a blood clot. What other changes can be made?  Manage your cholesterol levels. ? Eating a healthy diet is important for preventing high cholesterol. If cholesterol cannot be managed through diet alone, you may also need to take medicines. ? Take any prescribed medicines to control your cholesterol as told by your health care provider.  Manage your diabetes. ? Eating a healthy diet and exercising regularly are important parts of managing your blood sugar. If your blood sugar cannot be managed through diet and exercise, you may need to take medicines. ? Take any prescribed medicines to control your  diabetes as told by your health care provider.  Control your hypertension. ? To reduce your risk of stroke, try to keep your blood pressure below 130/80. ? Eating a healthy diet and exercising regularly are an important part of controlling your  blood pressure. If your blood pressure cannot be managed through diet and exercise, you may need to take medicines. ? Take any prescribed medicines to control hypertension as told by your health care provider. ? Ask your health care provider if you should monitor your blood pressure at home. ? Have your blood pressure checked every year, even if your blood pressure is normal. Blood pressure increases with age and some medical conditions.  Get evaluated for sleep disorders (sleep apnea). Talk to your health care provider about getting a sleep evaluation if you snore a lot or have excessive sleepiness.  Take over-the-counter and prescription medicines only as told by your health care provider. Aspirin or blood thinners (antiplatelets or anticoagulants) may be recommended to reduce your risk of forming blood clots that can lead to stroke.  Make sure that any other medical conditions you have, such as atrial fibrillation or atherosclerosis, are managed. What are the warning signs of a stroke? The warning signs of a stroke can be easily remembered as BEFAST.  B is for balance. Signs include: ? Dizziness. ? Loss of balance or coordination. ? Sudden trouble walking.  E is for eyes. Signs include: ? A sudden change in vision. ? Trouble seeing.  F is for face. Signs include: ? Sudden weakness or numbness of the face. ? The face or eyelid drooping to one side.  A is for arms. Signs include: ? Sudden weakness or numbness of the arm, usually on one side of the body.  S is for speech. Signs include: ? Trouble speaking (aphasia). ? Trouble understanding.  T is for time. ? These symptoms may represent a serious problem that is an emergency. Do not wait to see if the symptoms will go away. Get medical help right away. Call your local emergency services (911 in the [place] Do not drive yourself to the hospital.  Other signs of stroke may include: ? A sudden, severe headache with no known  cause. ? Nausea or vomiting. ? Seizure.  Where to find more information: For more information, visit:  American Stroke Association: www.strokeassociation.org  National Stroke Association: www.stroke.org  Summary  You can prevent a stroke by eating healthy, exercising, not smoking, limiting alcohol intake, and managing any medical conditions you may have.  Do not use any products that contain nicotine or tobacco, such as cigarettes and e-cigarettes. If you need help quitting, ask your health care provider. It may also be helpful to avoid exposure to secondhand smoke.  Remember BEFAST for warning signs of stroke. Get help right away if you or a loved one has any of these signs. This information is not intended to replace advice given to you by your health care provider. Make sure you discuss any questions you have with your health care provider. Document Released: 06/26/2004 Document Revised: 06/24/2016 Document Reviewed: 06/24/2016 Elsevier Interactive Patient Education  [employer]. .

## 2017-09-14 ENCOUNTER — Encounter: Payer: Self-pay | Admitting: Neurology

## 2017-09-14 MED ORDER — ASPIRIN EC 325 MG PO TBEC: 325.0000 mg | DELAYED_RELEASE_TABLET | Freq: Every day | ORAL | 0 refills | Status: AC

## 2017-10-06 ENCOUNTER — Ambulatory Visit: Payer: BLUE CROSS/BLUE SHIELD | Admitting: Neurology

## 2017-10-06 ENCOUNTER — Encounter

## 2017-11-04 ENCOUNTER — Encounter: Payer: Self-pay | Admitting: Neurology

## 2017-11-04 ENCOUNTER — Ambulatory Visit (INDEPENDENT_AMBULATORY_CARE_PROVIDER_SITE_OTHER): Payer: BLUE CROSS/BLUE SHIELD | Admitting: Neurology

## 2017-11-04 VITALS — BP 132/77 | HR 98 | Ht 65.0 in | Wt 204.0 lb

## 2017-11-04 DIAGNOSIS — R0683 Snoring: Secondary | ICD-10-CM

## 2017-11-04 DIAGNOSIS — F5104 Psychophysiologic insomnia: Secondary | ICD-10-CM | POA: Insufficient documentation

## 2017-11-04 DIAGNOSIS — G8929 Other chronic pain: Secondary | ICD-10-CM | POA: Diagnosis not present

## 2017-11-04 DIAGNOSIS — G4701 Insomnia due to medical condition: Secondary | ICD-10-CM

## 2017-11-04 DIAGNOSIS — Z9189 Other specified personal risk factors, not elsewhere classified: Secondary | ICD-10-CM

## 2017-11-04 NOTE — Progress Notes (Signed)
[facility]INE CL[facility]IC   Provider:  Larey Seat, M.D.   Primary Care Physician:  [facility]  Referring Provider: Sarina Ill, M.D. and Dr Saintclair Halsted, Neurosurgery   Chief Complaint  Patient presents with  . New Patient (Initial Visit)    rm 10, alone, snoring, stops breathing, husband has a cpap, never had a sleep study    HPI:  [name] [name] is a 62 y.o. female patient, seen here as in a referral from Dr. Sarina Ill, via Dr Saintclair Halsted.  [name] [name] reports that she feels usually refreshed and rested after sleep, but often sleeps only 2 hours of sleep at night.  Her medical history includes hyperlipidemia, diabetes, hypertension, migraines, irritable bowel syndrome and a recent MRI of the brain that showed small vessel ischemia and a remote lacunar infarct in the left thalamus.  Overall it was unlikely that this gave her any symptoms of significance.  She also has gained weight over the last 5 to 10 years.  Was referred for healthy weight and wellness to Dr. Leafy Ro.  Dr. Jaynee Eagles wanted her to be evaluated for the possible presence of sleep apnea considering her risk for recurrent strokes and TIAs.  The patient continues on aspirin, blood pressure goal is to keep blood pressure below 130/90 mmHg, and her hemoglobin A1c below 6.5%.    Chief complaint according to patient : " Sometimes I just fall asleep, mostly I don't need a lot of sleep- I watch TV at night"   Sleep habits are as follows: "I am a night owl, I go to bed at 10 o'clock and I am asleep whenever.  Soma and gabapentin help to fall asleep. I can go 3 nights with 6-8 hours of sleep, and others with 2-3 hours". Husband sleeps immediately when he lays down. The marital  bedroom is cool (74 degrees is considered cool by her), with a ceiling fan.  Sleeps on 2 pillows and on her side, still has hot flushes, still menopausal symptoms- 20 years after total hysterectomy and remains on HRT. The TV is running all night -and she  wakes up when husband switches it off.  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 She reports not being angry, upset, anxious or under an unusual amount of stress. Rises at 5-6 AM. Husband stated she snores, as do their children, have noted irregular breathing. She doubts that. She has nocturia once each night. She sleeps in intervals of 2- 4 hours, and when waking up has no headaches, neither pain, but a dry mouth. She has this sleep pattern for a long time, many years.  She may spend a whole day in bed sleeping on and off after a day where she had to be on her feet a lot.    Sleep medical history and family sleep history: unaware of biological family history. Mother deceased.  The patient reports that she has chronic back pain and has had multiple back surgeries, for total.  She is seen Dr. Saintclair Halsted for this.  She is not able to sit comfortably for longer than an hour or stand for longer than an hour.  She tries to move around or lays down to relieve her back pain.  She feels however that she does not need much sleep, and she watches TV at night in her bedroom.  Her history of diabetes hypertension and hyperlipidemia has been reviewed above.  Her current BMI is below 35.57.  Social history: 2 sons and one daughter, healthy-  all adult.  lives with husband, shares her bedroom. Disabled due to back problems, 2 years. Worked in Press photographer, office job, sitting.  Non smoker, ETOH - once a month a drink. Caffeine; none.   Dr. Cathren Laine note from 09-11-2017 : [name] [name] is a 62 y.o. female here as a referral from Dr. Criss Rosales for abnormal MRI.  Past medical history hyperlipidemia, diabetes, HTN, migraine, IBS. Patient was having neck pain and had MRI cervical spine  and saw something in the brain and a follow up was initiated. No difficulties with memory or balance, she has back pain and symptoms related to that but no significant balance issues or memory issues. No onset of symptoms, no acute onset of  paresthesias however she does have problems in the legs due to back. She went due to problems in the arm, have been going on for the last 6 months, worsening with neck pain. Her grip on the right is worsening. She snores and stops breathing in her sleep.   Review of Systems: Out of a complete 14 system review, the patient complains of only the following symptoms, and all other reviewed systems are negative.  Low sleepiness and fatigue score, cyclic insomnia, chronic insomnia with chronic back pain. Sleeps after soma and gabapentin.  Epworth score 5 , Fatigue severity score 25  , depression score n/a   Social History   Socioeconomic History  . Marital status: Married    Spouse name: Not on file  . Number of children: 2  . Years of education: Not on file  . Highest education level: Not on file  Occupational History  . Occupation: Press photographer  Social Needs  . Financial resource strain: Not on file  . Food insecurity:    Worry: Not on file    Inability: Not on file  . Transportation needs:    Medical: Not on file    Non-medical: Not on file  Tobacco Use  . Smoking status: Never Smoker  . Smokeless tobacco: Never Used  Substance and Sexual Activity  . Alcohol use: Yes    Alcohol/week: 0.0 oz    Comment: once a year  . Drug use: No  . Sexual activity: Not on file  Lifestyle  . Physical activity:    Days per week: Not on file    Minutes per session: Not on file  . Stress: Not on file  Relationships  . Social connections:    Talks on phone: Not on file    Gets together: Not on file    Attends religious service: Not on file    Active member of club or organization: Not on file    Attends meetings of clubs or organizations: Not on file    Relationship status: Not on file  . Intimate partner violence:    Fear of current or ex partner: Not on file    Emotionally abused: Not on file    Physically abused: Not on file    Forced sexual activity: Not on file  Other Topics Concern   . Not on file  Social History Narrative   Married. She has 2 daughters. She works in Press photographer. No caffeine.   Last update 03/07/2015.    Family History  Problem Relation Age of Onset  . Colon cancer Neg Hx   . Stomach cancer Neg Hx   . Esophageal cancer Neg Hx     Past Medical History:  Diagnosis Date  . Arthritis    left foot  . Bronchitis   .  Complication of anesthesia    Patient woke up during bunionectomy and breast cyst removal surgery  . Diabetes mellitus without complication ([place])   . Environmental and seasonal allergies   . GERD (gastroesophageal reflux disease)   . Glaucoma    Bilateral  . Hyperlipemia   . Hypertension   . Optic nerve disorder    left eye  . Pneumonia 2014  . PONV (postoperative nausea and vomiting)    Slight nausea  . Vitamin D deficiency     Past Surgical History:  Procedure Laterality Date  . ABDOMINAL HYSTERECTOMY    . BACK SURGERY     X 4  . BREAST CYST EXCISION Bilateral    20+ years ago  . BUNIONECTOMY Bilateral   . COLONOSCOPY    . FINGER SURGERY Left    Thumb    Current Outpatient Medications  Medication Sig Dispense Refill  . Amlodipine-Valsartan-HCTZ 5-160-12.5 MG TABS Take 1 tablet by mouth at bedtime.    Marland Kitchen aspirin EC 325 MG tablet Take 1 tablet (325 mg total) by mouth daily. 30 tablet 0  . atorvastatin (LIPITOR) 40 MG tablet Take 40 mg by mouth daily.    . carisoprodol (SOMA) 350 MG tablet Take 350 mg by mouth 4 (four) times daily as needed for muscle spasms.    . cyclobenzaprine (FLEXERIL) 5 MG tablet Take 5 mg by mouth 3 (three) times daily as needed for muscle spasms.    . diphenhydrAMINE (BENADRYL) 25 mg capsule Take 25 mg by mouth at bedtime as needed.    Marland Kitchen EPINEPHRINE, ANAPHYLAXIS THERAPY AGENTS, Inject 0.3 mg into the muscle.    . estradiol (ESTRACE) 1 MG tablet Take 1 mg by mouth daily.    . fenofibrate (TRICOR) 145 MG tablet Take 145 mg by mouth daily.    . fluticasone (FLONASE) 50 MCG/ACT nasal spray Place  1 spray into both nostrils daily.    Marland Kitchen gabapentin (NEURONTIN) 300 MG capsule Take 300 mg by mouth at bedtime.    Marland Kitchen ibuprofen (ADVIL,MOTRIN) 800 MG tablet Take 800 mg by mouth every 8 (eight) hours as needed for moderate pain.    Marland Kitchen latanoprost (XALATAN) 0.005 % ophthalmic solution Place 1 drop into both eyes at bedtime.    . montelukast (SINGULAIR) 10 MG tablet Take 10 mg by mouth at bedtime.    . potassium chloride SA (K-DUR,KLOR-CON) 20 MEQ tablet Take 20 mEq by mouth daily.    . SitaGLIPtin-MetFORMIN HCl [id number] MG TB24 Take 1 tablet by mouth at bedtime.    . traMADol (ULTRAM) 50 MG tablet Take 50 mg by mouth every 6 (six) hours as needed for pain.     No current facility-administered medications for this visit.     Allergies as of 11/04/2017 - Review Complete 11/04/2017  Allergen Reaction Noted  . Adhesive [tape] Dermatitis 02/04/2013  . Latex Dermatitis 02/04/2013  . Codeine Nausea And Vomiting 07/06/2008  . Erythromycin Itching and Swelling 07/06/2008  . Amoxicillin Diarrhea 02/07/2015  . Azithromycin  09/10/2017  . Macrolides and ketolides  09/10/2017  . Other  02/07/2015  . Peanut-containing drug products  03/07/2015  . Potassium clavulanate [clavulanic acid]  09/10/2017  . Augmentin [amoxicillin-pot clavulanate] Diarrhea 05/19/2014  . Iodinated diagnostic agents Itching 02/04/2013    Vitals: BP 132/77   Pulse 98   Ht 5\' 5"  (1.651 m)   Wt 204 lb (92.5 kg)   BMI 33.95 kg/m  Last Weight:  Wt Readings from Last 1 Encounters:  11/04/17 204 lb (  92.5 kg)   YTK:ZSWF mass index is 33.95 kg/m.     Last Height:   Ht Readings from Last 1 Encounters:  11/04/17 5\' 5"  (1.651 m)    Physical exam:  General: The patient is awake, alert and appears not in acute distress. The patient is groomed, alert and cooperative. . Head: Normocephalic, atraumatic. Neck is supple. Mallampati 3, laterally narrowed.   neck circumference:15.25 ". Nasal airflow patent on the left, right  congested. Frequent sinus congestion.  Retrognathia is seen.  Cardiovascular:  Regular rate and rhythm , without  murmurs or carotid bruit, and without distended neck veins. Respiratory: Lungs are clear to auscultation. Skin:  Without evidence of edema, or rash Trunk: BMI is 34.  Neurologic exam : The patient is awake and alert, oriented to place and time.   Memory subjective  described as intact. Attention span & concentration ability appears normal. She reports daydreaming, fleeting attention.  Speech is fluent, without dysarthria, dysphonia or aphasia.  Mood and affect are effervescent  Cranial nerves: Pupils are equal and briskly reactive to light. Funduscopic exam without evidence of pallor or edema.  Extraocular movements  in vertical and horizontal planes intact and without nystagmus. Visual fields by finger perimetry are intact. Hearing intact - she listens to loud volume TV.Marland Kitchen Facial sensation intact to fine touch. Facial motor strength is symmetric and tongue and uvula move midline. Shoulder shrug was symmetrical.   Motor exam:   Normal tone, muscle bulk and symmetric strength in upper extremities, symmetric grip strength.  Sensory:  Fine touch, pinprick and vibration were tested in all extremities. Proprioception tested in the upper extremities was normal. Coordination:  Finger-to-nose maneuver normal without evidence of ataxia, dysmetria or tremor. Gait and station: Patient walks without assistive device - deferred motor exam- see Dr Cathren Laine notes.   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 Insomnia is responding to SOMA and Gabapentin. Chronic pain related to insomnia.    2)  Snoring and apnea witnessed by family members, OSA risk factors are retrognathia, BMI, large neck size, narrow upper airway, rhinitis.   3) central apnea can be promoted by muscle relaxants and use of narcotic pain medication ( SOMA and Tramadol)   4) reports ongoing hot flushes - 20 years after surgical menopause. Remains on hormone replacement therapy.  This is a risk factor for small vessel disease and thrombosis.     The patient was advised of the nature of the diagnosed disorder , the treatment options and the  risks for general health and wellness arising from not treating the condition.   I spent more than 35 minutes of face to face time with the patient.  Greater than 50% of time was spent in counseling and coordination of care. We have discussed the diagnosis and differential and I answered the patient's questions.    Plan:  Treatment plan and additional workup : This patient does not think she has a sleep problem, she can sleep when she feels sleepy and no longer has to wake at a certain time. Her 3 children are in their 32s.  No sleep disorder in any of them.  Sleep hygiene instructions explained and boot camp letter provided.  HST to rule out  OSA and CSA.   I don't believe an attended sleep study in a TV free room would be appreciated.    Larey Seat, MD 01/06/5642, 32:95 AM  Certified in Neurology by ABPN Certified in [place] [employer] by [facility] [address], [place], [place] [address]

## 2017-11-04 NOTE — Patient Instructions (Signed)

## 2017-12-09 ENCOUNTER — Ambulatory Visit (INDEPENDENT_AMBULATORY_CARE_PROVIDER_SITE_OTHER): Payer: BLUE CROSS/BLUE SHIELD | Admitting: Physician Assistant

## 2017-12-09 ENCOUNTER — Other Ambulatory Visit (INDEPENDENT_AMBULATORY_CARE_PROVIDER_SITE_OTHER): Payer: BLUE CROSS/BLUE SHIELD

## 2017-12-09 ENCOUNTER — Ambulatory Visit (INDEPENDENT_AMBULATORY_CARE_PROVIDER_SITE_OTHER)
Admission: RE | Admit: 2017-12-09 | Discharge: 2017-12-09 | Disposition: A | Discharge status: Home / Self Care | Payer: BLUE CROSS/BLUE SHIELD | Source: Ambulatory Visit | Attending: Physician Assistant | Admitting: Physician Assistant

## 2017-12-09 ENCOUNTER — Encounter: Payer: Self-pay | Admitting: Physician Assistant

## 2017-12-09 ENCOUNTER — Telehealth: Payer: Self-pay | Admitting: *Deleted

## 2017-12-09 VITALS — BP 124/58 | HR 88 | Ht 65.0 in | Wt 203.2 lb

## 2017-12-09 DIAGNOSIS — R1012 Left upper quadrant pain: Secondary | ICD-10-CM

## 2017-12-09 DIAGNOSIS — R0789 Other chest pain: Secondary | ICD-10-CM | POA: Diagnosis not present

## 2017-12-09 LAB — COMPREHENSIVE METABOLIC PANEL
ALBUMIN: 4.1 g/dL (ref 3.5–5.2)
ALK PHOS: 30 U/L — AB (ref 39–117)
ALT: 19 U/L (ref 0–35)
AST: 17 U/L (ref 0–37)
BUN: 17 mg/dL (ref 6–23)
CALCIUM: 9.4 mg/dL (ref 8.4–10.5)
CO2: 25 mEq/L (ref 19–32)
Chloride: 107 mEq/L (ref 96–112)
Creatinine, Ser: 1.02 mg/dL (ref 0.40–1.20)
GFR: 70.3 mL/min (ref >60.00)
Glucose, Bld: 96 mg/dL (ref 70–99)
POTASSIUM: 3.9 meq/L (ref 3.5–5.1)
Sodium: 141 mEq/L (ref 135–145)
TOTAL PROTEIN: 7.7 g/dL (ref 6.0–8.3)
Total Bilirubin: 0.3 mg/dL (ref 0.2–1.2)

## 2017-12-09 LAB — CBC WITH DIFFERENTIAL/PLATELET
BASOS ABS: 0.1 10*3/uL (ref 0.0–0.1)
Basophils Relative: 1 % (ref 0.0–3.0)
Eosinophils Absolute: 0.3 10*3/uL (ref 0.0–0.7)
Eosinophils Relative: 3 % (ref 0.0–5.0)
HCT: 38 % (ref 36.0–46.0)
HEMOGLOBIN: 12.7 g/dL (ref 12.0–15.0)
LYMPHS ABS: 4.6 10*3/uL — AB (ref 0.7–4.0)
LYMPHS PCT: 50.1 % — AB (ref 12.0–46.0)
MCHC: 33.4 g/dL (ref 30.0–36.0)
MCV: 95.6 fl (ref 78.0–100.0)
Monocytes Absolute: 0.7 10*3/uL (ref 0.1–1.0)
Monocytes Relative: 7.5 % (ref 3.0–12.0)
NEUTROS PCT: 38.4 % — AB (ref 43.0–77.0)
Neutro Abs: 3.5 10*3/uL (ref 1.4–7.7)
Platelets: 390 10*3/uL (ref 150.0–400.0)
RBC: 3.97 Mil/uL (ref 3.87–5.11)
RDW: 13.4 % (ref 11.5–15.5)
WBC: 9.2 10*3/uL (ref 4.0–10.5)

## 2017-12-09 LAB — HIGH SENSITIVITY CRP: CRP HIGH SENSITIVITY: 0.87 mg/L (ref 0.000–5.000)

## 2017-12-09 MED ORDER — PREDNISONE 50 MG PO TABS: ORAL_TABLET | ORAL | 0 refills | Status: DC

## 2017-12-09 NOTE — Telephone Encounter (Signed)
Called [name] [name] at [facility] to advise the CM[facility] stat lab results are normal. Per Amy Esterwood PA,  the patient can have the IV contrast for the CT scan of Abdomen & Pelvis.(  BUN was 17 and Creatinine was 1.02. )

## 2017-12-09 NOTE — Progress Notes (Addendum)
Subjective:    Patient ID: [name] [name], female    DOB: 1955-12-15, 62 y.o.   MRN: [id number]  HPI [name] [name] is a pleasant 62 year old African-American female known to Dr. Carlean Purl who was last seen in our office in October 2016.  She comes back today with concerns about recurring left upper quadrant pain. Patient is in generally good health with history of migraines, GERD, IBS, and costochondritis. She had colonoscopy in October 2016 with removal of a 4 mm sessile rectal polyp which was hyperplastic.  No internal hemorrhoids documented.  She is to follow-up in 10 years. EGD at that same time was a normal exam. She had CT of the abdomen and pelvis done in December 2016 which showed the right lung base to have a groundglass appearing nodule.  CT of the chest was then done showing a stable 6 mm nodule over the right hemidiaphragm. Patient says over the past couple of months she has been having recurrent episodes of fairly intense pain in the left upper quadrant that she describes as being sharp, tight and crampy in nature.  She says this frequently wakes her up about 3 AM and may last for 10 minutes and then gradually subsides.  She may get a little nauseated due to the intensity of the pain but does not have any vomiting, no associated diaphoresis, she does not feel that the pain is positional but sometimes makes it hard for her to take a deep breath because it is hurting. Generally during the daytime she has been okay, she is aware of discomfort in the left upper quadrant and left lower chest generally is not intense.  Her appetite is been fine, her weight has been stable she denies any heartburn or indigestion, no dysphasia.  She has not noticed any change in the pain with or without food and no change in her bowel habits.  She says she at times has tried to attribute the pain to certain foods like onions etc. it is not sure that has anything to do with it.  She has not been doing any repetitive  movements or exercises. She is on chronic NSAIDs for chronic low back pain.  She has taken Flexeril periodically for her back as well and says that does not seem to do anything for this left upper quadrant discomfort.  Exline She says she is worried and just wants to have evaluation done.  Review of Systems;Pertinent positive and negative review of systems were noted in the above HPI section.  All other review of systems was otherwise negative.  Outpatient Encounter Medications as of 12/09/2017  Medication Sig  . Amlodipine-Valsartan-HCTZ 5-160-12.5 MG TABS Take 1 tablet by mouth at bedtime.  Marland Kitchen aspirin EC 325 MG tablet Take 1 tablet (325 mg total) by mouth daily.  Marland Kitchen atorvastatin (LIPITOR) 40 MG tablet Take 40 mg by mouth daily.  . carisoprodol (SOMA) 350 MG tablet Take 350 mg by mouth 4 (four) times daily as needed for muscle spasms.  . cyclobenzaprine (FLEXERIL) 5 MG tablet Take 5 mg by mouth 3 (three) times daily as needed for muscle spasms.  . diphenhydrAMINE (BENADRYL) 25 mg capsule Take 25 mg by mouth at bedtime as needed.  Marland Kitchen EPINEPHRINE, ANAPHYLAXIS THERAPY AGENTS, Inject 0.3 mg into the muscle.  . estradiol (ESTRACE) 1 MG tablet Take 1 mg by mouth daily.  . fenofibrate (TRICOR) 145 MG tablet Take 145 mg by mouth daily.  . fluticasone (FLONASE) 50 MCG/ACT nasal spray Place 1 spray into  both nostrils daily.  Marland Kitchen gabapentin (NEURONTIN) 300 MG capsule Take 300 mg by mouth at bedtime.  Marland Kitchen ibuprofen (ADVIL,MOTRIN) 800 MG tablet Take 800 mg by mouth every 8 (eight) hours as needed for moderate pain.  Marland Kitchen latanoprost (XALATAN) 0.005 % ophthalmic solution Place 1 drop into both eyes at bedtime.  . montelukast (SINGULAIR) 10 MG tablet Take 10 mg by mouth at bedtime.  . potassium chloride SA (K-DUR,KLOR-CON) 20 MEQ tablet Take 20 mEq by mouth daily.  . SitaGLIPtin-MetFORMIN HCl [id number] MG TB24 Take 1 tablet by mouth at bedtime.  . traMADol (ULTRAM) 50 MG tablet Take 50 mg by mouth every 6 (six) hours  as needed for pain.  . predniSONE (DELTASONE) 50 MG tablet Take 1 tab 13 hours before ct scan, then 1 tab at 7 hours prior to ct scan, then 1 tab 1 hour prior to ct scan.   No facility-administered encounter medications on file as of 12/09/2017.    Allergies  Allergen Reactions  . Adhesive [Tape] Dermatitis  . Latex Dermatitis  . Codeine Nausea And Vomiting  . Erythromycin Itching and Swelling  . Amoxicillin Diarrhea  . Azithromycin   . Macrolides And Ketolides   . Other     FOOD ALLERGIES MSG  Green peas Onions cucumber Mustard Peanuts Corn sesame Yellow onions lettuce RED DYE NO PERFUMES  . Peanut-Containing Drug Products   . Potassium Clavulanate [Clavulanic Acid]   . Augmentin [Amoxicillin-Pot Clavulanate] Diarrhea    Per discussion 07/26/2012, pt tolerates Omnicef well.  . Iodinated Diagnostic Agents Itching    Tolerates iodinated contrast with Benadryl 50mg  PO prior to administration.  jkl   Patient Active Problem List   Diagnosis Date Noted  . At risk for sleep apnea 11/04/2017  . Snoring 11/04/2017  . Insomnia secondary to chronic pain 11/04/2017  . Psychophysiological insomnia 11/04/2017  . HNP (herniated nucleus pulposus), lumbar 02/14/2015  . ALLERGY, FOOD 07/06/2008  . COSTOCHONDRITIS 07/06/2008  . ALLERGY 07/06/2008  . MIGRAINE HEADACHE 07/23/2007  . GERD 07/23/2007  . IRRITABLE BOWEL SYNDROME 07/23/2007  . OSTEOPENIA 07/23/2007   Social History   Socioeconomic History  . Marital status: Married    Spouse name: Not on file  . Number of children: 2  . Years of education: Not on file  . Highest education level: Not on file  Occupational History  . Occupation: Press photographer  Social Needs  . Financial resource strain: Not on file  . Food insecurity:    Worry: Not on file    Inability: Not on file  . Transportation needs:    Medical: Not on file    Non-medical: Not on file  Tobacco Use  . Smoking status: Never Smoker  . Smokeless tobacco: Never  Used  Substance and Sexual Activity  . Alcohol use: Yes    Alcohol/week: 0.0 oz    Comment: once a year  . Drug use: No  . Sexual activity: Not on file  Lifestyle  . Physical activity:    Days per week: Not on file    Minutes per session: Not on file  . Stress: Not on file  Relationships  . Social connections:    Talks on phone: Not on file    Gets together: Not on file    Attends religious service: Not on file    Active member of club or organization: Not on file    Attends meetings of clubs or organizations: Not on file    Relationship status: Not on file  .  Intimate partner violence:    Fear of current or ex partner: Not on file    Emotionally abused: Not on file    Physically abused: Not on file    Forced sexual activity: Not on file  Other Topics Concern  . Not on file  Social History Narrative   Married. She has 2 daughters. She works in Press photographer. No caffeine.   Last update 03/07/2015.    Ms. Brogan family history is not on file.      Objective:    Vitals:   12/09/17 0923  BP: (!) 124/58  Pulse: 88    Physical Exam; well-developed older African-American female in no acute distress, pleasant blood pressure 124/58 pulse 88, height 5 foot 5, weight 203, BMI 33.8.  HEENT; nontraumatic normocephalic EOMI PERRLA sclera anicteric oropharynx clear, Cardiovascular ;regular rate and rhythm with S1-S2 no murmur rub or gallop, Pulmonary; clear bilaterally, Abdomen; obese, soft bowel sounds are present she is tender in the left upper quadrant really along the left costal margin and over the left lower anterior and lateral ribs.  There is some mild left upper quadrant tenderness no guarding or rebound no palpable mass or hepatosplenomegaly, Rectal; exam not done, Extremities; no clubbing cyanosis or edema skin warm and dry, Neuro psych ;mood and affect appropriate, grossly nonfocal alert and oriented.       Assessment & Plan:   #78 62 year old African-American female  with history of  IBS who comes in with 90-month history of intermittent left upper quadrant pain which is frequently awakening her at night and is described as sharp tight and intense at times. On exam she definitely has a component of costochondritis with tenderness over the left anterior and lateral lower rib margin and lower ribs around laterally and this reproduces her pain. She also has some mild left upper quadrant tenderness, will rule out any other intra-abdominal inflammatory process or neoplasm. Rule out underlying bony disease  #2 colon cancer surveillance-up-to-date last colonoscopy October 2016 with one hyperplastic polyp removed, interval follow-up recommended 2026  #3 previously noted stable 6 mm right lung base nodule #4 status post lumbar fusion  Plan; will obtain baseline labs, and inflammatory markers Rib details left lower anterior lateral Scheduled for CT of the abdomen and pelvis with contrast.  Patient does have contrast allergy will be given prednisone and Benadryl pretreatment Have asked her to try a lidocaine OTC patch over the area where she generally feels the most intense pain and to use this at bedtime over the next week or so to see if helps alleviate the nighttime symptoms. She is already on chronic NSAIDs/ibuprofen generally taking 800 mg every 8 hours Further plans pending results of above.  [name] [name] Genia Harold PA-C 12/09/2017  CT negative Agree Gatha Mayer, MD, [facility]   Cc: Center, [facility]

## 2017-12-09 NOTE — Patient Instructions (Addendum)
If you are age 63 or younger, your body mass index should be between 19-25. Your Body mass index is 33.81 kg/m. If this is out of the aformentioned range listed, please consider follow up with your Primary Care Provider.   Your provider has requested that you go to the basement level for lab work before leaving today. Press "B" on the elevator. The lab is located at the first door on the left as you exit the elevator.  Also to to the radiology department, basement level for an x-ray.   Try Salonpas w. Lidocaine for pain, apply a patch at the effected site before bedtime.     You have been scheduled for a CT scan of the abdomen and pelvis at [facility] ([address]---this is in the same building as Press [employer]).   You are scheduled on 12-16-2017  at  10:00 am.  You should arrive 15 minutes prior to your appointment time for registration. Please follow the written instructions below on the day of your exam:  WARNING: IF YOU ARE ALLERGIC TO IODINE/X-RAY DYE, PLEASE NOTIFY RADIOLOGY IMMEDIATELY AT [phone]! YOU WILL BE GIVEN A 13 HOUR PREMEDICATION PREP.  1) Do not eat  anything after  6:00 am     (4 hours prior to your test) 2) You have been given 2 bottles of oral contrast to drink. The solution may taste better if refrigerated, but do NOT add ice or any other liquid to this solution. Shake well before drinking.    Drink 1 bottle of contrast @ 8:00 am      (2 hours prior to your exam)  Drink 1 bottle of contrast @ 9:00 am      (1 hour prior to your exam)  You may take any medications as prescribed with a small amount of water except for the following: Metformin, Glucophage, Glucovance, Avandamet, Riomet, Fortamet, Actoplus Met, Janumet, Glumetza or Metaglip. The above medications must be held the day of the exam AND 48 hours after the exam.  The purpose of you drinking the oral contrast is to aid in the visualization of your intestinal tract. The contrast solution  may cause some diarrhea. Before your exam is started, you will be given a small amount of fluid to drink. Depending on your individual set of symptoms, you may also receive an intravenous injection of x-ray contrast/dye. Plan on being at [facility] for 30 minutes or long, depending on the type of exam you are having performed.  If you have any questions regarding your exam or if you need to reschedule, you may call the CT department at [phone] between the hours of 8:00 am and 5:00 pm, Monday-Friday.  ________________________________________________________________________We sent Prednisone 50 mg tablets to your pharmacy for the pre-medication for the IV contrast.  You will take 1 Prednisone 50 mg tablet at 9:00 PM the evening before 7-16.  You will take 1 Prednisone 50 mg tablet at 3:00 AM, during the night ( 7-17)  before the CT scan. You will take 1 Prednisone 50 mg tablet at 9:00 AM ( 7-17)  prior to the CT scan.  You will also ake 1 Benedryl tablet at 9:00 AM before the CT scan. (7-17).

## 2017-12-14 ENCOUNTER — Ambulatory Visit (INDEPENDENT_AMBULATORY_CARE_PROVIDER_SITE_OTHER): Payer: BLUE CROSS/BLUE SHIELD | Admitting: Neurology

## 2017-12-14 DIAGNOSIS — G4701 Insomnia due to medical condition: Secondary | ICD-10-CM

## 2017-12-14 DIAGNOSIS — R0683 Snoring: Secondary | ICD-10-CM

## 2017-12-14 DIAGNOSIS — F5104 Psychophysiologic insomnia: Secondary | ICD-10-CM

## 2017-12-14 DIAGNOSIS — G8929 Other chronic pain: Secondary | ICD-10-CM

## 2017-12-14 DIAGNOSIS — G4733 Obstructive sleep apnea (adult) (pediatric): Secondary | ICD-10-CM

## 2017-12-14 DIAGNOSIS — G4734 Idiopathic sleep related nonobstructive alveolar hypoventilation: Secondary | ICD-10-CM

## 2017-12-14 DIAGNOSIS — Z9189 Other specified personal risk factors, not elsewhere classified: Secondary | ICD-10-CM

## 2017-12-16 ENCOUNTER — Other Ambulatory Visit: Payer: BLUE CROSS/BLUE SHIELD

## 2017-12-17 ENCOUNTER — Ambulatory Visit (INDEPENDENT_AMBULATORY_CARE_PROVIDER_SITE_OTHER)
Admission: RE | Admit: 2017-12-17 | Discharge: 2017-12-17 | Disposition: A | Discharge status: Home / Self Care | Payer: BLUE CROSS/BLUE SHIELD | Source: Ambulatory Visit | Attending: Physician Assistant | Admitting: Physician Assistant

## 2017-12-17 DIAGNOSIS — R1012 Left upper quadrant pain: Secondary | ICD-10-CM

## 2017-12-17 DIAGNOSIS — R0789 Other chest pain: Secondary | ICD-10-CM

## 2017-12-17 MED ORDER — IOPAMIDOL (ISOVUE-300) INJECTION 61%
100.0000 mL | Freq: Once | INTRAVENOUS | Status: AC | PRN
  Administered 2017-12-17: 100 mL via INTRAVENOUS

## 2017-12-17 MED ORDER — PREDNISONE 1 MG PO TABS: 50.0000 mg | ORAL_TABLET | Freq: Four times a day (QID) | ORAL | Status: DC

## 2017-12-17 MED ORDER — DIPHENHYDRAMINE HCL 25 MG PO CAPS: 50.0000 mg | ORAL_CAPSULE | Freq: Once | ORAL | Status: DC

## 2017-12-17 MED ORDER — DIPHENHYDRAMINE HCL 50 MG/ML IJ SOLN: 50.0000 mg | Freq: Once | INTRAMUSCULAR | Status: DC

## 2017-12-24 NOTE — Addendum Note (Signed)
Addended by: Larey Seat on: 12/24/2017 06:58 PM   Modules accepted: Orders

## 2017-12-24 NOTE — Procedures (Signed)
[facility] [place], [place] [address] NAME:  [name] [name]                                                                DOB: 1955/12/28 MEDICAL RECORD NUMBER  [id number]                                               DOS: 12/14/2017  REFERRING PHYSICIAN: Sarina Ill, M.D. STUDY PERFORMED: Home Sleep Study by [facility]  HISTORY: [name] [name] is a 62 y.o. female patient, seen here as in a referral from Dr. Sarina Ill, via Dr. Saintclair Halsted. [name] reports that she feels usually refreshed and rested after sleep, but often sleeps only 2 hours at night.  Dr. Jaynee Eagles wanted her to be evaluated for the possible presence of sleep apnea considering her risk for recurrent strokes and TIAs. The patient continues on aspirin, blood pressure and diabetes control.  Chief complaint according to patient: "Sometimes I just fall asleep, mostly I don't need a lot of sleep- I watch TV at night". Patient on muscle relaxants, pain medication. Family has witnessed snoring and apnea, and cyclic Insomnia.  Epworth Sleepiness score 5/24, Fatigue severity score 25, BMI: 33.9  STUDY RESULTS:  Total Recording Time: 9 hours 51 minutes, valid test time was only 4 h and 5 min.   Total Apnea/Hypopnea Index (AHI):  25.7/h: RDI: 27.6/h Average Oxygen Saturation:  92%; Lowest Oxygen Saturation: 78%  Total Time Oxygen Saturation Below 89 %:  28.0 minutes of 245 minutes test time. Average Heart Rate:  76 bpm (between 61 and 160 bpm) IMPRESSION: Moderate Severe Obstructive Sleep apnea with hypoxemia and tachycardia.  Moderate -loud snoring recorded. RECOMMENDATION: 1) Insomnia treatment with Sleep hygiene implementation 2) CPAP treatment for OSA.- this degree of apnea and hypoxia will not respond as well to dental devices 3) weight loss, 4) reduce or eliminate muscle relaxants and sedatives.  I certify that I have reviewed the raw data recording prior to the issuance of this report in  accordance with the standards of the American Academy of Sleep Medicine (AASM). Larey Seat, M.D.     12-24-2017    Medical Director of [facility] at [facility], accredited by the AASM. Diplomat of the ABPN and ABSM.

## 2017-12-28 ENCOUNTER — Telehealth: Payer: Self-pay | Admitting: Neurology

## 2017-12-28 NOTE — Telephone Encounter (Signed)
-----   Message from Larey Seat, MD sent at 12/24/2017  6:58 PM EDT ----- IMPRESSION: Moderate Severe Obstructive Sleep apnea with  hypoxemia and tachycardia.  Moderate -loud snoring recorded. RECOMMENDATION: 1) Insomnia treatment with Sleep hygiene  Implementation, chronic insomnia may be treated with counseling   2) CPAP treatment for OSA.- this degree of apnea and hypoxia will not respond as well to dental devices  3) weight loss will reduce apnea and snoring  4) reduce or eliminate muscle relaxants and sedatives to improve snoring.

## 2017-12-28 NOTE — Telephone Encounter (Signed)
I called pt. I advised pt that Dr. Brett Fairy reviewed their sleep study results and found that pt has moderate to severe sleep apnea. Dr. Brett Fairy recommends that pt starts auto CPAP. I reviewed PAP compliance expectations with the pt. Pt is agreeable to starting a CPAP. I advised pt that an order will be sent to a DME, [facility], and [facility] will call the pt within about one week after they file with the pt's insurance. [facility] will show the pt how to use the machine, fit for masks, and troubleshoot the CPAP if needed. A follow up appt was made for insurance purposes with Dr. Brett Fairy on Oct 29,2019 at 1:30 pm. Pt verbalized understanding to arrive 15 minutes early and bring their CPAP. A letter with all of this information in it will be mailed to the pt as a reminder. I verified with the pt that the address we have on file is correct. Pt verbalized understanding of results. Pt had no questions at this time but was encouraged to call back if questions arise.

## 2017-12-30 ENCOUNTER — Ambulatory Visit (INDEPENDENT_AMBULATORY_CARE_PROVIDER_SITE_OTHER): Payer: BLUE CROSS/BLUE SHIELD | Admitting: Orthopedic Surgery

## 2018-01-05 ENCOUNTER — Other Ambulatory Visit: Payer: Self-pay | Admitting: Sports Medicine

## 2018-01-05 DIAGNOSIS — S83242A Other tear of medial meniscus, current injury, left knee, initial encounter: Secondary | ICD-10-CM

## 2018-01-15 ENCOUNTER — Ambulatory Visit
Admission: RE | Admit: 2018-01-15 | Discharge: 2018-01-15 | Disposition: A | Discharge status: Home / Self Care | Payer: BLUE CROSS/BLUE SHIELD | Source: Ambulatory Visit | Attending: Sports Medicine | Admitting: Sports Medicine

## 2018-01-15 DIAGNOSIS — S83242A Other tear of medial meniscus, current injury, left knee, initial encounter: Secondary | ICD-10-CM

## 2018-02-02 ENCOUNTER — Other Ambulatory Visit: Payer: Self-pay | Admitting: Neurology

## 2018-02-02 ENCOUNTER — Telehealth: Payer: Self-pay | Admitting: Neurology

## 2018-02-02 DIAGNOSIS — G4733 Obstructive sleep apnea (adult) (pediatric): Secondary | ICD-10-CM

## 2018-02-02 DIAGNOSIS — Z9989 Dependence on other enabling machines and devices: Secondary | ICD-10-CM

## 2018-02-02 DIAGNOSIS — R0683 Snoring: Secondary | ICD-10-CM

## 2018-02-02 NOTE — Telephone Encounter (Signed)
Pt has called to inform that after 3 nights in a row of using the CPAP she has been waking up with a stiff neck and feeling like she has lock jaw.  Pt is asking to discuss the settings on her CPAP please call.

## 2018-02-02 NOTE — Telephone Encounter (Signed)
Called the patient no answer. LVM informing her that we will make some changes to her cpap mask. I will go ahead and send the new orders. LVM instructing the patient to call back.

## 2018-03-15 ENCOUNTER — Ambulatory Visit: Payer: BLUE CROSS/BLUE SHIELD | Admitting: Neurology

## 2018-03-15 ENCOUNTER — Encounter: Payer: Self-pay | Admitting: Neurology

## 2018-03-15 VITALS — BP 107/66 | HR 74 | Ht 65.0 in | Wt 204.0 lb

## 2018-03-15 DIAGNOSIS — E669 Obesity, unspecified: Secondary | ICD-10-CM | POA: Diagnosis not present

## 2018-03-15 DIAGNOSIS — R9082 White matter disease, unspecified: Secondary | ICD-10-CM | POA: Diagnosis not present

## 2018-03-15 DIAGNOSIS — I6381 Other cerebral infarction due to occlusion or stenosis of small artery: Secondary | ICD-10-CM

## 2018-03-15 DIAGNOSIS — G4733 Obstructive sleep apnea (adult) (pediatric): Secondary | ICD-10-CM

## 2018-03-15 NOTE — Progress Notes (Signed)
[facility]    Provider:  Dr Jaynee Eagles Referring Provider: Dr. Glendon Axe Primary Care Physician:  Center, [facility], Dr. Glendon Axe  CC:  Abnormal MRI  Interval history: Here for follow up of stroke. This is a 62 year old patient who was referred for abnormal MRI.  Past medical history includes hyperlipidemia, diabetes, hypertension, migraine, IBS.  MRI of the brain showed chronic small vessel ischemia and a remote lacunar infarct in the left thalamus. Diagnosed with sleep apnea and is now using a cpap. She had moderately severe OSA AHI 26. She does not know if she is feeling better. She has an appointment with Dr. Brett Fairy the end of the month. Otherwise doing well, no problems, just having neck issues and ongoing dry needling. Reviewed report, she is doing well > 70% using cpap >=4 hours.   HPI:  [name] [name] is a 62 y.o. female here as a referral from Dr. Domingo Madeira for abnormal MRI.  Past medical history hyperlipidemia, diabetes, HTN, migraine, IBS. Patient was having neck pain and had mri cervical and saw something in the brain and a follow up was initiated. No difficulties with memory or balance, she has back pain and symptoms related to that but no significant balance issues or memory issues. No onset of symptoms, no acute onset of paresthesias however she does have problems in the legs due to back. She went due to problems in the arm, have been going on for the last 6 months, worsening with neck pain. Her grip on the right is worsening. She snores and stops breathing in her sleep.  Reviewed notes, labs and imaging from outside physicians, which showed:  Reviewed referring physician notes for lesion of the brainstem.  MRI of the brain was performed by [facility].  Overall she remains asymptomatic.  She has chronic neck and back pain and she is doing physical therapy and working with pain management.  Reviewed thorough complete neurologic exam which was  nonfocal.  MRI of the brain shows increased T2 signal in her brainstem with what appears to be expansion of the pons and small vessel ischemic changes throughout the white matter cerebral hemispheres.  Personally reviewed MRI brain images and agree with the following:  IMPRESSION: Pontine signal abnormality on previous cervical MRI is most likely chronic small vessel ischemia, which is also seen to a moderate degree in the cerebral white matter. There is remote lacunar infarct in the left thalamus. If there are bulbar symptoms a follow-up could be obtained to document stability.  [id number] 6 2016  Review of Systems: Patient complains of symptoms per HPI as well as the following symptoms:fatigue . Pertinent negatives and positives per HPI. All others negative.   Social History   Socioeconomic History  . Marital status: Married    Spouse name: Not on file  . Number of children: 2  . Years of education: 14+  . Highest education level: Some college, no degree  Occupational History  . Occupation: Press photographer  Social Needs  . Financial resource strain: Not on file  . Food insecurity:    Worry: Not on file    Inability: Not on file  . Transportation needs:    Medical: Not on file    Non-medical: Not on file  Tobacco Use  . Smoking status: Never Smoker  . Smokeless tobacco: Never Used  Substance and Sexual Activity  . Alcohol use: Yes    Alcohol/week: 0.0 standard drinks    Comment: once a year  . Drug  use: No  . Sexual activity: Not on file  Lifestyle  . Physical activity:    Days per week: Not on file    Minutes per session: Not on file  . Stress: Not on file  Relationships  . Social connections:    Talks on phone: Not on file    Gets together: Not on file    Attends religious service: Not on file    Active member of club or organization: Not on file    Attends meetings of clubs or organizations: Not on file    Relationship status: Not on file  . Intimate partner  violence:    Fear of current or ex partner: Not on file    Emotionally abused: Not on file    Physically abused: Not on file    Forced sexual activity: Not on file  Other Topics Concern  . Not on file  Social History Narrative   Married. She has 2 daughters. She works in Press photographer. No caffeine- allergic    Right handed          Family History  Problem Relation Age of Onset  . Colon cancer Neg Hx   . Stomach cancer Neg Hx   . Esophageal cancer Neg Hx     Past Medical History:  Diagnosis Date  . Arthritis    left foot  . Bronchitis   . Complication of anesthesia    Patient woke up during bunionectomy and breast cyst removal surgery  . Diabetes mellitus without complication ([place])   . Environmental and seasonal allergies   . GERD (gastroesophageal reflux disease)   . Glaucoma    Bilateral  . Hyperlipemia   . Hypertension   . Optic nerve disorder    left eye  . Pneumonia 2014  . PONV (postoperative nausea and vomiting)    Slight nausea  . Vitamin D deficiency     Past Surgical History:  Procedure Laterality Date  . ABDOMINAL HYSTERECTOMY    . BACK SURGERY     X 4  . BREAST CYST EXCISION Bilateral    20+ years ago  . BUNIONECTOMY Bilateral   . CATARACT EXTRACTION, BILATERAL Bilateral 2019   with lens implant; Dr. Bing Plume. June 2019 Left eye; July 2019 Right eye.  Marland Kitchen COLONOSCOPY    . FINGER SURGERY Left    Thumb    Current Outpatient Medications  Medication Sig Dispense Refill  . Amlodipine-Valsartan-HCTZ 5-160-12.5 MG TABS Take 1 tablet by mouth at bedtime.    Marland Kitchen aspirin EC 325 MG tablet Take 1 tablet (325 mg total) by mouth daily. 30 tablet 0  . atorvastatin (LIPITOR) 40 MG tablet Take 40 mg by mouth daily.    . carisoprodol (SOMA) 350 MG tablet Take 350 mg by mouth 4 (four) times daily as needed for muscle spasms.    . cyclobenzaprine (FLEXERIL) 5 MG tablet Take 5 mg by mouth 3 (three) times daily as needed for muscle spasms.    . diphenhydrAMINE (BENADRYL)  25 mg capsule Take 25 mg by mouth at bedtime as needed.    Marland Kitchen estradiol (ESTRACE) 1 MG tablet Take 1 mg by mouth daily.    . fenofibrate (TRICOR) 145 MG tablet Take 145 mg by mouth daily.    . fluticasone (FLONASE) 50 MCG/ACT nasal spray Place 1 spray into both nostrils daily.    Marland Kitchen ibuprofen (ADVIL,MOTRIN) 800 MG tablet Take 800 mg by mouth every 8 (eight) hours as needed for moderate pain.    Marland Kitchen  latanoprost (XALATAN) 0.005 % ophthalmic solution Place 1 drop into both eyes at bedtime.    . montelukast (SINGULAIR) 10 MG tablet Take 10 mg by mouth at bedtime.    . potassium chloride SA (K-DUR,KLOR-CON) 20 MEQ tablet Take 20 mEq by mouth daily.    . SitaGLIPtin-MetFORMIN HCl 570 560 9059 MG TB24 Take 1 tablet by mouth at bedtime.    Marland Kitchen EPINEPHRINE, ANAPHYLAXIS THERAPY AGENTS, Inject 0.3 mg into the muscle.    . gabapentin (NEURONTIN) 300 MG capsule Take 300 mg by mouth at bedtime.    . predniSONE (DELTASONE) 50 MG tablet Take 1 tab 13 hours before ct scan, then 1 tab at 7 hours prior to ct scan, then 1 tab 1 hour prior to ct scan. (Patient not taking: Reported on 03/15/2018) 3 tablet 0  . traMADol (ULTRAM) 50 MG tablet Take 50 mg by mouth every 6 (six) hours as needed for pain.     No current facility-administered medications for this visit.     Allergies as of 03/15/2018 - Review Complete 03/15/2018  Allergen Reaction Noted  . Adhesive [tape] Dermatitis 02/04/2013  . Latex Dermatitis 02/04/2013  . Codeine Nausea And Vomiting 07/06/2008  . Erythromycin Itching and Swelling 07/06/2008  . Amoxicillin Diarrhea 02/07/2015  . Azithromycin  09/10/2017  . Macrolides and ketolides  09/10/2017  . Other  02/07/2015  . Peanut-containing drug products  03/07/2015  . Potassium clavulanate [clavulanic acid]  09/10/2017  . Augmentin [amoxicillin-pot clavulanate] Diarrhea 05/19/2014  . Iodinated diagnostic agents Itching 02/04/2013    Vitals: BP 107/66 (BP Location: Right Arm, Patient Position: Sitting)    Pulse 74   Ht 5\' 5"  (1.651 m)   Wt 204 lb (92.5 kg)   BMI 33.95 kg/m  Last Weight:  Wt Readings from Last 1 Encounters:  03/15/18 204 lb (92.5 kg)   Last Height:   Ht Readings from Last 1 Encounters:  03/15/18 5\' 5"  (1.651 m)    Physical exam: Exam: Gen: NAD, conversant, well nourised, well groomed                     CV: RRR, no MRG. No Carotid Bruits. No peripheral edema, warm, nontender Eyes: Conjunctivae clear without exudates or hemorrhage  Neuro: Detailed Neurologic Exam  Speech:    Speech is normal; fluent and spontaneous with normal comprehension.  Cognition:    The patient is oriented to person, place, and time;     recent and remote memory intact;     language fluent;     normal attention, concentration,     fund of knowledge Cranial Nerves:    The pupils are equal, round, and reactive to light. Attempted findoscopic exam could not visualize due to small pupils.. Visual fields are full to finger confrontation. Extraocular movements are intact. Trigeminal sensation is intact and the muscles of mastication are normal. The face is symmetric. The palate elevates in the midline. Hearing intact. Voice is normal. Shoulder shrug is normal. The tongue has normal motion without fasciculations.   Coordination:    Normal finger to nose and heel to shin.    Gait:    Heel-toe and tandem gait are normal.   Motor Observation:    No asymmetry, no atrophy, and no involuntary movements noted. Tone:    Normal muscle tone.    Posture:    Posture is normal. normal erect    Strength:    Strength is V/V in the upper and lower limbs.  Sensation: intact to LT     Reflex Exam:  DTR's:    Deep tendon reflexes in the upper and lower extremities are normal bilaterally.   Toes:    The toes are downgoing bilaterally.   Clonus:    Clonus is absent.      Assessment/Plan: This is a 62 year old patient who was referred for abnormal MRI.  Past medical history includes  hyperlipidemia, diabetes, hypertension, migraine, IBS.  MRI of the brain showed chronic small vessel ischemia and a remote lacunar infarct in the left thalamus.  This is likely nonspecific white matter changes due to her past medical history which includes multiple vascular risk factors as above.  I did discuss with patient that the lacunar infarct likely due to small vessel disease.  We will repeat in 6 months to a year to follow progression.  - Repeat MRI brain in 24months to a year to follow significant white matter changes on MRI brain -OSA: now on cpap -Referred to healthy weight and wellness center here at [facility] Dr. Leafy Ro for her obesity (referred her in April, will check and see if they are this booked up or maybe she was lost to follow up there she still has not been scheduled there) - Excellent article to read: discussed with ehr today.  [email or web address] Needs to tightly control vascular risk factors as discussed.  I had a long d/w patient about her remote stroke, risk for recurrent stroke/TIAs, personally independently reviewed imaging studies and stroke evaluation results and answered questions.Continue ASA 325 for secondary stroke prevention and maintain strict control of hypertension with blood pressure goal below 130/90, diabetes with hemoglobin A1c goal below 6.5% and lipids with LDL cholesterol goal below 70 mg/dL. I also advised the patient to eat a healthy diet with plenty of whole grains, cereals, fruits and vegetables, exercise regularly and maintain ideal body weight .  Orders Placed This Encounter  Procedures  . MR BRAIN W WO CONTRAST  . Ambulatory referral to Family Practice   Discussed: To prevent or relieve headaches, try the following: Cool Compress. Lie down and place a cool compress on your head.  Avoid headache triggers. If certain foods or odors seem to have triggered your migraines in the past, avoid them. A headache  diary might help you identify triggers.  Include physical activity in your daily routine. Try a daily walk or other moderate aerobic exercise.  Manage stress. Find healthy ways to cope with the stressors, such as delegating tasks on your to-do list.  Practice relaxation techniques. Try deep breathing, yoga, massage and visualization.  Eat regularly. Eating regularly scheduled meals and maintaining a healthy diet might help prevent headaches. Also, drink plenty of fluids.  Follow a regular sleep schedule. Sleep deprivation might contribute to headaches Consider biofeedback. With this mind-body technique, you learn to control certain bodily functions - such as muscle tension, heart rate and blood pressure - to prevent headaches or reduce headache pain.    Proceed to emergency room if you experience new or worsening symptoms or symptoms do not resolve, if you have new neurologic symptoms or if headache is severe, or for any concerning symptom.   Provided education and documentation from American headache Society toolbox including articles on: chronic migraine medication overuse headache, chronic migraines, prevention of migraines, behavioral and other nonpharmacologic treatments for headache.   Cc: Dr. Tamala Julian and Dr. Richrd Sox, [facility] [address] [place], [place] [address]  Phone [phone] Fax [phone]  A total of 25 minutes was spent face-to-face with this patient. Over half this time was spent on counseling patient on the  1. White matter abnormality on MRI of brain   2. Lacunar stroke (HCC)   3. Obesity (BMI 30-39.9)   4. OSA (obstructive sleep apnea)    diagnosis and different diagnostic and therapeutic options, counseling and coordination of care, risks ans benefits of management, compliance, or risk factor reduction and education.

## 2018-03-25 ENCOUNTER — Encounter (INDEPENDENT_AMBULATORY_CARE_PROVIDER_SITE_OTHER): Payer: BLUE CROSS/BLUE SHIELD

## 2018-03-28 ENCOUNTER — Encounter: Payer: Self-pay | Admitting: Neurology

## 2018-03-30 ENCOUNTER — Encounter: Payer: Self-pay | Admitting: Neurology

## 2018-03-30 ENCOUNTER — Ambulatory Visit (INDEPENDENT_AMBULATORY_CARE_PROVIDER_SITE_OTHER): Payer: BLUE CROSS/BLUE SHIELD | Admitting: Neurology

## 2018-03-30 VITALS — BP 130/68 | HR 94 | Ht 65.0 in | Wt 203.0 lb

## 2018-03-30 DIAGNOSIS — Z789 Other specified health status: Secondary | ICD-10-CM

## 2018-03-30 DIAGNOSIS — Z9989 Dependence on other enabling machines and devices: Secondary | ICD-10-CM | POA: Diagnosis not present

## 2018-03-30 DIAGNOSIS — G4733 Obstructive sleep apnea (adult) (pediatric): Secondary | ICD-10-CM | POA: Diagnosis not present

## 2018-03-30 DIAGNOSIS — F5104 Psychophysiologic insomnia: Secondary | ICD-10-CM

## 2018-03-30 NOTE — Progress Notes (Addendum)
SLEEP MEDICINE CLINIC   Provider:  Larey Seat, M.D.   Primary Care Physician:  [facility]  Referring Provider: Sarina Ill, M.D. and Dr Saintclair Halsted, Neurosurgery   Chief Complaint  Patient presents with  . Follow-up    pt alone, rm 11. pt states that she doesnt sleep very well with the machine. she states that she has a hard time adjusting and dont feel like she sleeps good. D[facility] [employer]    HPI:  [name] [name] is a 62 y.o. female patient, seen here in a Rv 03-30-2018,  I have the pleasure of meeting [name] [name] today she had a home sleep test by apnea link on 14 December 2017 and had to my surprise a rather moderate to severe degree of obstructive sleep apnea at an AHI of 25.7, RDI of 27.6 indicating that additional moderate snoring was present she also had a long this oxygenation time 28 minutes of her 240 minutes of test time were with out sufficient oxygenation.  The nadir for oxygen was 78%, but heart rate between 6160 bpm.  I recommended that she should try CPAP for obstructive sleep apnea at this degree.    CPAP was initiated and from mid-September on the patient began to use it more compliantly.  40 September to  22 October she used it for about 68% of the time with an average day user time of 6 hours 5 minutes, the residual AHI is 0.1 the pressure at the 95th percentile is 10.3 and we had another download here today over the last 30 days and unfortunately he the compliance is only 60% but the AHI remains very low at 0.1/h the 95th percentile pressure is 10.5 cm water.  I would like to increase the maximum pressure on her CPAP and urged her to use the machine each night for 4 hours minimum. She has used a nasal pillow dream wear- and cant sleep on her side as well.     CONSULT:  [name] [name] reports that she feels usually refreshed and rested after sleep, but often sleeps only 2 hours of sleep at night.  Her medical history includes hyperlipidemia, diabetes,  hypertension, migraines, irritable bowel syndrome and a recent MRI of the brain that showed small vessel ischemia and a remote lacunar infarct in the left thalamus.  Overall it was unlikely that this gave her any symptoms of significance.  She also has gained weight over the last 5 to 10 years.  Was referred for healthy weight and wellness to Dr. Leafy Ro.  Dr. Jaynee Eagles wanted her to be evaluated for the possible presence of sleep apnea considering her risk for recurrent strokes and TIAs.  The patient continues on aspirin, blood pressure goal is to keep blood pressure below 130/90 mmHg, and her hemoglobin A1c below 6.5%.    Chief complaint according to patient : " Sometimes I just fall asleep, mostly I don't need a lot of sleep- I watch TV at night"   Sleep habits are as follows: "I am a night owl, I go to bed at 10 o'clock and I am asleep whenever.  Soma and gabapentin help to fall asleep. I can go 3 nights with 6-8 hours of sleep, and others with 2-3 hours". Husband sleeps immediately when he lays down. The marital  bedroom is cool (74 degrees is considered cool by her), with a ceiling fan.  Sleeps on 2 pillows and on her side, still has hot flushes, still menopausal symptoms- 20 years after total  hysterectomy and remains on HRT. The TV is running all night -and she wakes up when husband switches it off.  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 She reports not being angry, upset, anxious or under an unusual amount of stress. Rises at 5-6 AM. Husband stated she snores, as do their children, have noted irregular breathing. She doubts that. She has nocturia once each night. She sleeps in intervals of 2- 4 hours, and when waking up has no headaches, neither pain, but a dry mouth. She has this sleep pattern for a long time, many years.  She may spend a whole day in bed sleeping on and off after a day where she had to be on her feet a lot.    Sleep medical history and family sleep history: unaware of  biological family history. Mother deceased.  The patient reports that she has chronic back pain and has had multiple back surgeries, for total.  She is seen Dr. Saintclair Halsted for this.  She is not able to sit comfortably for longer than an hour or stand for longer than an hour.  She tries to move around or lays down to relieve her back pain.  She feels however that she does not need much sleep, and she watches TV at night in her bedroom.  Her history of diabetes hypertension and hyperlipidemia has been reviewed above.  Her current BMI is below 35.57.  Social history: 2 sons and one daughter, healthy- all adult.  lives with husband, shares her bedroom. Disabled due to back problems, 2 years. Worked in [employer], office job, sitting.  Non smoker, ETOH - once a month a drink. Caffeine; none.   Dr. Cathren Laine note from 09-11-2017 : [name] [name] is a 62 y.o. female here as a referral from Dr. Criss Rosales for abnormal MRI.  Past medical history hyperlipidemia, diabetes, HTN, migraine, IBS. Patient was having neck pain and had MRI cervical spine  and saw something in the brain and a follow up was initiated. No difficulties with memory or balance, she has back pain and symptoms related to that but no significant balance issues or memory issues. No onset of symptoms, no acute onset of paresthesias however she does have problems in the legs due to back. She went due to problems in the arm, have been going on for the last 6 months, worsening with neck pain. Her grip on the right is worsening. She snores and stops breathing in her sleep.   Review of Systems: Out of a complete 14 system review, the patient complains of only the following symptoms, and all other reviewed systems are negative.  Low sleepiness and fatigue score, cyclic insomnia, chronic insomnia with chronic back pain. Sleeps after soma and gabapentin.  Epworth score 4/ 24  , Fatigue severity score on CPAP is 17 from  25 /63 pre CPAP  , depression score   Social  History   Socioeconomic History  . Marital status: Married    Spouse name: Not on file  . Number of children: 2  . Years of education: 14+  . Highest education level: Some college, no degree  Occupational History  . Occupation: Press photographer  Social Needs  . Financial resource strain: Not on file  . Food insecurity:    Worry: Not on file    Inability: Not on file  . Transportation needs:    Medical: Not on file    Non-medical: Not on file  Tobacco Use  . Smoking status: Never Smoker  .  Smokeless tobacco: Never Used  Substance and Sexual Activity  . Alcohol use: Yes    Alcohol/week: 0.0 standard drinks    Comment: once a year  . Drug use: No  . Sexual activity: Not on file  Lifestyle  . Physical activity:    Days per week: Not on file    Minutes per session: Not on file  . Stress: Not on file  Relationships  . Social connections:    Talks on phone: Not on file    Gets together: Not on file    Attends religious service: Not on file    Active member of club or organization: Not on file    Attends meetings of clubs or organizations: Not on file    Relationship status: Not on file  . Intimate partner violence:    Fear of current or ex partner: Not on file    Emotionally abused: Not on file    Physically abused: Not on file    Forced sexual activity: Not on file  Other Topics Concern  . Not on file  Social History Narrative   Married. She has 2 daughters. She works in Press photographer. No caffeine- allergic    Right handed          Family History  Problem Relation Age of Onset  . Colon cancer Neg Hx   . Stomach cancer Neg Hx   . Esophageal cancer Neg Hx     Past Medical History:  Diagnosis Date  . Arthritis    left foot  . Bronchitis   . Complication of anesthesia    Patient woke up during bunionectomy and breast cyst removal surgery  . Diabetes mellitus without complication ([place])   . Environmental and seasonal allergies   . GERD (gastroesophageal reflux  disease)   . Glaucoma    Bilateral  . Hyperlipemia   . Hypertension   . Optic nerve disorder    left eye  . Pneumonia 2014  . PONV (postoperative nausea and vomiting)    Slight nausea  . Vitamin D deficiency     Past Surgical History:  Procedure Laterality Date  . ABDOMINAL HYSTERECTOMY    . BACK SURGERY     X 4  . BREAST CYST EXCISION Bilateral    20+ years ago  . BUNIONECTOMY Bilateral   . CATARACT EXTRACTION, BILATERAL Bilateral 2019   with lens implant; Dr. Bing Plume. June 2019 Left eye; July 2019 Right eye.  Marland Kitchen COLONOSCOPY    . FINGER SURGERY Left    Thumb    Current Outpatient Medications  Medication Sig Dispense Refill  . Amlodipine-Valsartan-HCTZ 5-160-12.5 MG TABS Take 1 tablet by mouth at bedtime.    Marland Kitchen aspirin EC 325 MG tablet Take 1 tablet (325 mg total) by mouth daily. 30 tablet 0  . atorvastatin (LIPITOR) 40 MG tablet Take 40 mg by mouth daily.    . carisoprodol (SOMA) 350 MG tablet Take 350 mg by mouth 4 (four) times daily as needed for muscle spasms.    . cyclobenzaprine (FLEXERIL) 5 MG tablet Take 5 mg by mouth 3 (three) times daily as needed for muscle spasms.    . diphenhydrAMINE (BENADRYL) 25 mg capsule Take 25 mg by mouth at bedtime as needed.    Marland Kitchen EPINEPHRINE, ANAPHYLAXIS THERAPY AGENTS, Inject 0.3 mg into the muscle.    . estradiol (ESTRACE) 1 MG tablet Take 1 mg by mouth daily.    . fenofibrate (TRICOR) 145 MG tablet Take 145 mg by mouth daily.    Marland Kitchen  fluticasone (FLONASE) 50 MCG/ACT nasal spray Place 1 spray into both nostrils daily.    Marland Kitchen gabapentin (NEURONTIN) 300 MG capsule Take 300 mg by mouth at bedtime.    Marland Kitchen ibuprofen (ADVIL,MOTRIN) 800 MG tablet Take 800 mg by mouth every 8 (eight) hours as needed for moderate pain.    Marland Kitchen latanoprost (XALATAN) 0.005 % ophthalmic solution Place 1 drop into both eyes at bedtime.    . montelukast (SINGULAIR) 10 MG tablet Take 10 mg by mouth at bedtime.    . potassium chloride SA (K-DUR,KLOR-CON) 20 MEQ tablet Take 20 mEq  by mouth daily.    . predniSONE (DELTASONE) 50 MG tablet Take 1 tab 13 hours before ct scan, then 1 tab at 7 hours prior to ct scan, then 1 tab 1 hour prior to ct scan. 3 tablet 0  . SitaGLIPtin-MetFORMIN HCl [phone] MG TB24 Take 1 tablet by mouth at bedtime.    . traMADol (ULTRAM) 50 MG tablet Take 50 mg by mouth every 6 (six) hours as needed for pain.    . Vitamin D, Ergocalciferol, (DRISDOL) 50000 units CAPS capsule TK 1 C PO Q 2 WEEKS  5   No current facility-administered medications for this visit.     Allergies as of 03/30/2018 - Review Complete 03/30/2018  Allergen Reaction Noted  . Adhesive [tape] Dermatitis 02/04/2013  . Latex Dermatitis 02/04/2013  . Codeine Nausea And Vomiting 07/06/2008  . Erythromycin Itching and Swelling 07/06/2008  . Amoxicillin Diarrhea 02/07/2015  . Azithromycin  09/10/2017  . Macrolides and ketolides  09/10/2017  . Other  02/07/2015  . Peanut-containing drug products  03/07/2015  . Potassium clavulanate [clavulanic acid]  09/10/2017  . Augmentin [amoxicillin-pot clavulanate] Diarrhea 05/19/2014  . Iodinated diagnostic agents Itching 02/04/2013    Vitals: BP 130/68   Pulse 94   Ht 5\' 5"  (1.651 m)   Wt 203 lb (92.1 kg)   BMI 33.78 kg/m  Last Weight:  Wt Readings from Last 1 Encounters:  03/30/18 203 lb (92.1 kg)   ZDG:UYQI mass index is 33.78 kg/m.     Last Height:   Ht Readings from Last 1 Encounters:  03/30/18 5\' 5"  (1.651 m)    Physical exam:  General: The patient is awake, alert and appears not in acute distress. The patient is groomed, alert and cooperative. . Head: Normocephalic, atraumatic. Neck is supple. Mallampati 3, laterally narrowed.   neck circumference:15.25 ".  Nasal airflow patent on the left, right congested. Frequent sinus congestion.  Retrognathia is seen.  Cardiovascular:  Regular rate and rhythm , without murmurs or carotid bruit, and without distended neck veins. Respiratory: Lungs are clear to  auscultation. Skin:  Without evidence of edema, or rash Trunk: BMI is still 34.  Neurologic exam : She reports daydreaming, fleeting attention.  Speech is fluent, without dysarthria, dysphonia or aphasia.  Mood and affect are effervescent  Cranial nerves: Pupils are equal and briskly reactive to light. Funduscopic exam without evidence of pallor or edema.  Extraocular movements  in vertical and horizontal planes intact and without nystagmus. Visual fields by finger perimetry are intact. Hearing intact - she listens to loud volume TV.Marland Kitchen Facial sensation intact to fine touch. Facial motor strength is symmetric and tongue and uvula move midline. Shoulder shrug was symmetrical.   Motor exam:   Normal tone, muscle bulk and symmetric strength in upper extremities, symmetric grip strength.  Sensory:  Fine touch, pinprick and vibration were tested in all extremities. Proprioception tested in the upper extremities  was normal. Coordination:  Finger-to-nose maneuver normal without evidence of ataxia, dysmetria or tremor. Gait and station: Patient walks without assistive device - deferred motor exam- see Dr Cathren Laine notes.   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 Insomnia is responding to SOMA and Gabapentin. Chronic pain related to insomnia.  This is also a psychological condition  2)  Snoring and apnea witnessed by family members, OSA risk factors are retrognathia, BMI, large neck size, narrow upper airway, rhinitis. OSA confirmed on HST watch pat at AHI 26/h.  Needs to improve compliance. No evidence of central apnea.   The patient was advised of the nature of the diagnosed disorder , the  treatment options and the  risks for general health and wellness arising from not treating the condition.   I spent more than 35 minutes of face to face time with the patient.  Greater than 50% of time was spent in counseling and coordination of care. We have discussed the diagnosis and differential and I answered the patient's questions.    Plan:  Treatment plan and additional workup : This patient didnot think she has a sleep problem, she can sleep when she feels sleepy and no longer has to wake at a certain time. She has rhinitis, and sinusitis,  OSA confirmed and she is doing well with AHI reduction- 0.1/h.  Given her white matter brain disease, I think she needs to become compliant and use the CPAP nightly- over 4 hours.  This may stall the progression.  We touched briefly on insomnia, sleep hygiene.  She did not want to sleep in a TV free room . She is still not going to bed before midnight.    RV in 12 month with NP.   Larey Seat, MD 76/[id number]/[id number], 7:90 PM  Certified in Neurology by [employer]BP[employer] Certified in Sleep Medicine by [facility] [address] [place], [place] [address]

## 2018-03-30 NOTE — Patient Instructions (Signed)

## 2018-04-06 ENCOUNTER — Ambulatory Visit (INDEPENDENT_AMBULATORY_CARE_PROVIDER_SITE_OTHER): Payer: BLUE CROSS/BLUE SHIELD | Admitting: Family Medicine

## 2018-04-06 ENCOUNTER — Encounter (INDEPENDENT_AMBULATORY_CARE_PROVIDER_SITE_OTHER): Payer: Self-pay | Admitting: Family Medicine

## 2018-04-06 VITALS — BP 105/63 | HR 70 | Temp 98.2°F | Ht 65.0 in | Wt 199.0 lb

## 2018-04-06 DIAGNOSIS — Z1331 Encounter for screening for depression: Secondary | ICD-10-CM

## 2018-04-06 DIAGNOSIS — Z0289 Encounter for other administrative examinations: Secondary | ICD-10-CM

## 2018-04-06 DIAGNOSIS — Z6833 Body mass index (BMI) 33.0-33.9, adult: Secondary | ICD-10-CM

## 2018-04-06 DIAGNOSIS — R5383 Other fatigue: Secondary | ICD-10-CM

## 2018-04-06 DIAGNOSIS — E7849 Other hyperlipidemia: Secondary | ICD-10-CM

## 2018-04-06 DIAGNOSIS — E119 Type 2 diabetes mellitus without complications: Secondary | ICD-10-CM | POA: Diagnosis not present

## 2018-04-06 DIAGNOSIS — R0602 Shortness of breath: Secondary | ICD-10-CM

## 2018-04-06 DIAGNOSIS — Z9189 Other specified personal risk factors, not elsewhere classified: Secondary | ICD-10-CM | POA: Diagnosis not present

## 2018-04-06 DIAGNOSIS — E559 Vitamin D deficiency, unspecified: Secondary | ICD-10-CM

## 2018-04-06 DIAGNOSIS — E669 Obesity, unspecified: Secondary | ICD-10-CM

## 2018-04-06 NOTE — Progress Notes (Signed)
Office: [phone]  /  Fax: [phone]   Dear Dr. Jaynee Eagles,   Thank you for referring Araceli Bouche to our clinic. The following note includes my evaluation and treatment recommendations.  HPI:   Chief Complaint: OBESITY    JEWELL HAUGHT has been referred by Berta Minor B. Jaynee Eagles, MD for consultation regarding her obesity and obesity related comorbidities.    [name] [name] (MR# [id number]) is a 62 y.o. female who presents on 04/06/2018 for obesity evaluation and treatment. Current BMI is Body mass index is 33.12 kg/m.Marland Kitchen Kambri has been struggling with her weight for many years and has been unsuccessful in either losing weight, maintaining weight loss, or reaching her healthy weight goal. Jessi has multiple food allergies.     Terica attended our information session and states she is currently in the action stage of change and ready to dedicate time achieving and maintaining a healthier weight. Jaritza is interested in becoming our patient and working on intensive lifestyle modifications including (but not limited to) diet, exercise and weight loss.    Takeisha states her family eats meals together she thinks her family will eat healthier with  her her desired weight loss is 50 lbs she started gaining weight in the last 5 yrs her heaviest weight ever was 200 lbs. she is a picky eater and doesn't like to eat healthier foods  she has significant food cravings issues  she is frequently drinking liquids with calories she frequently eats larger portions than normal    Fatigue Sharnelle feels her energy is lower than it should be. This has worsened with weight gain and has not worsened recently. Marcedes admits to daytime somnolence and denies waking up still tired. Patient has a history of obstructive sleep apnea which may contribute to her fatigue. Patent has a history of symptoms of daytime fatigue. Patient generally gets 4 to 6 hours of sleep per night, and states they generally have restless  sleep. Snoring is present. Apneic episodes are present. Epworth Sleepiness Score is 3  Dyspnea on exertion Marajade notes increasing shortness of breath with exercising and seems to be worsening over time with weight gain. She notes getting out of breath sooner with activity than she used to. This has not gotten worse recently. Annsleigh denies orthopnea.  Diabetes II Niharika has a diagnosis of diabetes type II. She is currently taking Janumet. Kannon does not check blood sugars and she denies any hypoglycemic episodes. There is no recent Hgb A1c result. She has been working on intensive lifestyle modifications including diet, exercise, and weight loss to help control her blood glucose levels.  Hyperlipidemia Jeanette has hyperlipidemia and she is on Lipitor 40 mg daily and Tricor. She is attempting to control her cholesterol levels with intensive lifestyle modification including a low saturated fat diet, exercise and weight loss. She denies any chest pain or myalgias.  At risk for cardiovascular disease Brookelin is at a higher than average risk for cardiovascular disease due to obesity, diabetes and hyperlipidemia She currently denies any chest pain.  Vitamin D deficiency Janesia has a diagnosis of vitamin D deficiency. She is currently taking multi vitamin. Dhana admits to fatigue and denies nausea, vomiting or muscle weakness.  Depression Screen [place]'s Food and Mood (modified PHQ-9) score was  Depression screen PHQ 2/9 04/06/2018  Decreased Interest 1  Down, Depressed, Hopeless 0  PHQ - 2 Score 1  Altered sleeping 0  Tired, decreased energy 1  Change in appetite 1  Feeling bad or failure  about yourself  0  Trouble concentrating 0  Moving slowly or fidgety/restless 0  Suicidal thoughts 0  PHQ-9 Score 3  Difficult doing work/chores Not difficult at all    ALLERGIES: Allergies  Allergen Reactions  . Adhesive [Tape] Dermatitis  . Latex Dermatitis  . Codeine Nausea And Vomiting  .  Erythromycin Itching and Swelling  . Amoxicillin Diarrhea  . Azithromycin   . Macrolides And Ketolides   . Other     FOOD ALLERGIES MSG  Green peas Onions cucumber Mustard Peanuts Corn sesame Yellow onions lettuce RED DYE NO PERFUMES  . Peanut-Containing Drug Products   . Potassium Clavulanate [Clavulanic Acid]   . Augmentin [Amoxicillin-Pot Clavulanate] Diarrhea    Per discussion 07/26/2012, pt tolerates Omnicef well.  . Iodinated Diagnostic Agents Itching    Tolerates iodinated contrast with Benadryl 50mg  PO prior to administration.  jkl    MEDICATIONS: Current Outpatient Medications on File Prior to Visit  Medication Sig Dispense Refill  . Amlodipine-Valsartan-HCTZ 5-160-12.5 MG TABS Take 1 tablet by mouth at bedtime.    Marland Kitchen aspirin EC 325 MG tablet Take 1 tablet (325 mg total) by mouth daily. 30 tablet 0  . atorvastatin (LIPITOR) 40 MG tablet Take 40 mg by mouth daily.    . carisoprodol (SOMA) 350 MG tablet Take 350 mg by mouth 4 (four) times daily as needed for muscle spasms.    . cyclobenzaprine (FLEXERIL) 5 MG tablet Take 5 mg by mouth 3 (three) times daily as needed for muscle spasms.    . diphenhydrAMINE (BENADRYL) 25 mg capsule Take 25 mg by mouth at bedtime as needed.    Marland Kitchen EPINEPHRINE, ANAPHYLAXIS THERAPY AGENTS, Inject 0.3 mg into the muscle.    . estradiol (ESTRACE) 1 MG tablet Take 1 mg by mouth daily.    . fenofibrate (TRICOR) 145 MG tablet Take 145 mg by mouth daily.    . fluticasone (FLONASE) 50 MCG/ACT nasal spray Place 1 spray into both nostrils daily.    Marland Kitchen gabapentin (NEURONTIN) 300 MG capsule Take 300 mg by mouth at bedtime.    Marland Kitchen ibuprofen (ADVIL,MOTRIN) 800 MG tablet Take 800 mg by mouth every 8 (eight) hours as needed for moderate pain.    Marland Kitchen latanoprost (XALATAN) 0.005 % ophthalmic solution Place 1 drop into both eyes at bedtime.    . montelukast (SINGULAIR) 10 MG tablet Take 10 mg by mouth at bedtime.    . potassium chloride SA (K-DUR,KLOR-CON) 20 MEQ  tablet Take 20 mEq by mouth daily.    . predniSONE (DELTASONE) 50 MG tablet Take 1 tab 13 hours before ct scan, then 1 tab at 7 hours prior to ct scan, then 1 tab 1 hour prior to ct scan. 3 tablet 0  . SitaGLIPtin-MetFORMIN HCl [phone] MG TB24 Take 1 tablet by mouth at bedtime.    . traMADol (ULTRAM) 50 MG tablet Take 50 mg by mouth every 6 (six) hours as needed for pain.    . Vitamin D, Ergocalciferol, (DRISDOL) 50000 units CAPS capsule TK 1 C PO Q 2 WEEKS  5   No current facility-administered medications on file prior to visit.     PAST MEDICAL HISTORY: Past Medical History:  Diagnosis Date  . Angiomyolipoma of left kidney   . Arthritis    left foot  . Bronchitis   . Complication of anesthesia    Patient woke up during bunionectomy and breast cyst removal surgery  . Diabetes mellitus without complication ([place])   . Environmental and seasonal  allergies   . GERD (gastroesophageal reflux disease)   . Glaucoma    Bilateral  . Hyperlipemia   . Hypertension   . Lower back pain   . Mini stroke ([place])   . Multiple food allergies    MSG, green peas, onions, cucumber, mustard, peanuts, corn, sesame, lettuce, red dye, perfumes  . Optic nerve disorder    left eye  . Pneumonia 2014  . PONV (postoperative nausea and vomiting)    Slight nausea  . Prediabetes   . Rheumatoid arthritis ([place])   . Sleep apnea   . Vitamin D deficiency     PAST SURGICAL HISTORY: Past Surgical History:  Procedure Laterality Date  . ABDOMINAL HYSTERECTOMY    . BACK SURGERY     X 4  . BREAST CYST EXCISION Bilateral    20+ years ago  . BUNIONECTOMY Bilateral   . CATARACT EXTRACTION, BILATERAL Bilateral 2019   with lens implant; Dr. Bing Plume. June 2019 Left eye; July 2019 Right eye.  Marland Kitchen COLONOSCOPY    . FINGER SURGERY Left    Thumb    SOCIAL HISTORY: Social History   Tobacco Use  . Smoking status: Never Smoker  . Smokeless tobacco: Never Used  Substance Use Topics  . Alcohol use: Yes     Alcohol/week: 0.0 standard drinks    Comment: once a year  . Drug use: No    FAMILY HISTORY: Family History  Adopted: Yes  Problem Relation Age of Onset  . Hyperlipidemia Mother   . Hypertension Mother   . Colon cancer Neg Hx   . Stomach cancer Neg Hx   . Esophageal cancer Neg Hx     ROS: Review of Systems  Constitutional: Positive for malaise/fatigue.  HENT: Positive for sinus pain.   Eyes:       + Wear Glasses or Contacts  Respiratory: Positive for shortness of breath (on exertion).   Cardiovascular: Negative for chest pain and orthopnea.  Gastrointestinal: Positive for heartburn. Negative for nausea and vomiting.  Musculoskeletal: Positive for back pain and neck pain. Negative for myalgias.       Negative for muscle weakness  Psychiatric/Behavioral: The patient has insomnia.     PHYSICAL EXAM: Blood pressure 105/63, pulse 70, temperature 98.2 F (36.8 C), temperature source Oral, height 5\' 5"  (1.651 m), weight 199 lb (90.3 kg), SpO2 99 %. Body mass index is 33.12 kg/m. Physical Exam  Constitutional: She is oriented to person, place, and time. She appears well-developed and well-nourished.  HENT:  Head: Normocephalic and atraumatic.  Nose: Nose normal.  Eyes: EOM are normal. No scleral icterus.  Neck: Normal range of motion. Neck supple. No thyromegaly present.  Cardiovascular: Normal rate and regular rhythm.  Pulmonary/Chest: Effort normal. No respiratory distress.  Abdominal: Soft. There is no tenderness.  + Obesity  Musculoskeletal: Normal range of motion.  Range of Motion normal in all 4 extremities  Neurological: She is alert and oriented to person, place, and time. Coordination normal.  Skin: Skin is warm and dry.  Psychiatric: She has a normal mood and affect.  Vitals reviewed.   RECENT LABS AND TESTS: BMET    Component Value Date/Time   NA 141 12/09/2017 1020   K 3.9 12/09/2017 1020   CL 107 12/09/2017 1020   CO2 25 12/09/2017 1020   GLUCOSE 96  12/09/2017 1020   BUN 17 12/09/2017 1020   CREATININE 1.02 12/09/2017 1020   CALCIUM 9.4 12/09/2017 1020   GFRNONAA 54 (L) 02/09/2015 1103  GFRAA >60 02/09/2015 1103   Lab Results  Component Value Date   HGBA1C 6.0 (H) 02/09/2015   No results found for: INSULIN CBC    Component Value Date/Time   WBC 9.2 12/09/2017 1020   RBC 3.97 12/09/2017 1020   HGB 12.7 12/09/2017 1020   HCT 38.0 12/09/2017 1020   PLT 390.0 12/09/2017 1020   MCV 95.6 12/09/2017 1020   MCH 32.0 02/09/2015 1103   MCHC 33.4 12/09/2017 1020   RDW 13.4 12/09/2017 1020   LYMPHSABS 4.6 (H) 12/09/2017 1020   MONOABS 0.7 12/09/2017 1020   EOSABS 0.3 12/09/2017 1020   BASOSABS 0.1 12/09/2017 1020   Iron/TIBC/Ferritin/ %Sat No results found for: IRON, TIBC, FERRITIN, IRONPCTSAT Lipid Panel  No results found for: CHOL, TRIG, HDL, CHOLHDL, VLDL, LDLCALC, LDLDIRECT Hepatic Function Panel     Component Value Date/Time   PROT 7.7 12/09/2017 1020   ALBUMIN 4.1 12/09/2017 1020   AST 17 12/09/2017 1020   ALT 19 12/09/2017 1020   ALKPHOS 30 (L) 12/09/2017 1020   BILITOT 0.3 12/09/2017 1020   BILIDIR 0.1 10/02/2006 1620      Component Value Date/Time   TSH 0.62 10/02/2006 1620    ECG  shows NSR with a rate of 74 BPM INDIRECT CALORIMETER done today shows a VO2 of 278 and a REE of 1933.  Her calculated basal metabolic rate is 6734 thus her basal metabolic rate is better than expected.    ASSESSMENT AND PLAN: Other fatigue - Plan: EKG 12-Lead, Vitamin B12, CBC With Differential, Folate, T3, T4, free, TSH  Shortness of breath on exertion  Type 2 diabetes mellitus without complication, without long-term current use of insulin (HCC) - Plan: Comprehensive metabolic panel, Hemoglobin A1c, Insulin, random, Microalbumin / creatinine urine ratio  Other hyperlipidemia - Plan: Lipid Panel With LDL/HDL Ratio  Vitamin D deficiency - Plan: VITAMIN D 25 Hydroxy (Vit-D Deficiency, Fractures)  At risk for heart  disease  Depression screening  Class 1 obesity with serious comorbidity and body mass index (BMI) of 33.0 to 33.9 in adult, unspecified obesity type  PLAN: Fatigue Cherylann was informed that her fatigue may be related to obesity, depression or many other causes. Labs will be ordered, and in the meanwhile Cindi has agreed to work on diet, exercise and weight loss to help with fatigue. Proper sleep hygiene was discussed including the need for 7-8 hours of quality sleep each night.   Dyspnea on exertion Shelbylynn's shortness of breath appears to be obesity related and exercise induced. She has agreed to work on weight loss and gradually increase exercise to treat her exercise induced shortness of breath. If Cariah follows our instructions and loses weight without improvement of her shortness of breath, we will plan to refer to pulmonology. We will monitor this condition regularly. Nisha agrees to this plan.  Diabetes II Caitlinn has been given extensive diabetes education by myself today including ideal fasting and post-prandial blood glucose readings, individual ideal Hgb A1c goals and hypoglycemia prevention. We discussed the importance of good blood sugar control to decrease the likelihood of diabetic complications such as nephropathy, neuropathy, limb loss, blindness, coronary artery disease, and death. We discussed the importance of intensive lifestyle modification including diet, exercise and weight loss as the first line treatment for diabetes. We will check labs and Judyann agrees to start diet and follow up at the agreed upon time.  Hyperlipidemia Aracelly was informed of the American Heart Association Guidelines emphasizing intensive lifestyle modifications as the first  line treatment for hyperlipidemia. We discussed many lifestyle modifications today in depth, and Tamaka will start to work on diet and  decreasing saturated fats such as fatty red meat, butter and many fried foods. She will also  increase vegetables and lean protein in her diet and start to work on exercise and weight loss efforts. We will check labs and Bellany will follow up as directed.  Cardiovascular risk counseling Sherran was given extended (15 minutes) coronary artery disease prevention counseling today. She is 62 y.o. female and has risk factors for heart disease including obesity, diabetes and hyperlipidemia. We discussed intensive lifestyle modifications today with an emphasis on specific weight loss instructions and strategies. Pt was also informed of the importance of increasing exercise and decreasing saturated fats to help prevent heart disease.  Vitamin D Deficiency Dany was informed that low vitamin D levels contributes to fatigue and are associated with obesity, breast, and colon cancer. She will continue to take multi vitamin and will follow up for routine testing of vitamin D, at least 2-3 times per year. She was informed of the risk of over-replacement of vitamin D and agrees to not increase her dose unless she discusses this with [place] first. We will check labs and Atisha will follow up as directed.  Depression Screen Kashay had a negative depression screening. Depression is commonly associated with obesity and often results in emotional eating behaviors. We will monitor this closely and work on CBT to help improve the non-hunger eating patterns. Referral to Psychology may be required if no improvement is seen as she continues in our clinic.  Obesity Alaine is currently in the action stage of change and her goal is to continue with weight loss efforts. I recommend Aggie begin the structured treatment plan as follows:  She has agreed to follow the Category 2 plan +100 calories Breda has been instructed to eventually work up to a goal of 150 minutes of combined cardio and strengthening exercise per week for weight loss and overall health benefits. We discussed the following Behavioral Modification  Strategies today: increasing lean protein intake, decreasing simple carbohydrates  and work on meal planning and easy cooking plans   She was informed of the importance of frequent follow up visits to maximize her success with intensive lifestyle modifications for her multiple health conditions. She was informed we would discuss her lab results at her next visit unless there is a critical issue that needs to be addressed sooner. Alyssha agreed to keep her next visit at the agreed upon time to discuss these results.    OBESITY BEHAVIORAL INTERVENTION VISIT  Today's visit was # 1   Starting weight: 199 lbs Starting date: 04/06/18 Today's weight : 199 lbs  Today's date: 04/06/2018 Total lbs lost to date: 0   ASK: We discussed the diagnosis of obesity with Araceli Bouche today and Solomia agreed to give [place] permission to discuss obesity behavioral modification therapy today.  ASSESS: Thalia has the diagnosis of obesity and her BMI today is 33.12 Everlean is in the action stage of change   ADVISE: Ambrie was educated on the multiple health risks of obesity as well as the benefit of weight loss to improve her health. She was advised of the need for long term treatment and the importance of lifestyle modifications to improve her current health and to decrease her risk of future health problems.  AGREE: Multiple dietary modification options and treatment options were discussed and  Kyiesha agreed to follow the recommendations  documented in the above note.  ARRANGE: Geovana was educated on the importance of frequent visits to treat obesity as outlined per CMS and USPSTF guidelines and agreed to schedule her next follow up appointment today.  I, Doreene Nest, am acting as transcriptionist for [employer], Md  I have reviewed the above documentation for accuracy and completeness, and I agree with the above. -Dennard Nip, MD

## 2018-04-07 ENCOUNTER — Encounter (INDEPENDENT_AMBULATORY_CARE_PROVIDER_SITE_OTHER): Payer: Self-pay | Admitting: Family Medicine

## 2018-04-07 LAB — CBC WITH DIFFERENTIAL
BASOS ABS: 0.1 10*3/uL (ref 0.0–0.2)
Basos: 1 %
EOS (ABSOLUTE): 0.2 10*3/uL (ref 0.0–0.4)
EOS: 3 %
HEMATOCRIT: 39 % (ref 34.0–46.6)
HEMOGLOBIN: 12.5 g/dL (ref 11.1–15.9)
IMMATURE GRANS (ABS): 0 10*3/uL (ref 0.0–0.1)
Immature Granulocytes: 0 %
LYMPHS ABS: 4.4 10*3/uL — AB (ref 0.7–3.1)
LYMPHS: 49 %
MCH: 30.6 pg (ref 26.6–33.0)
MCHC: 32.1 g/dL (ref 31.5–35.7)
MCV: 95 fL (ref 79–97)
MONOCYTES: 8 %
Monocytes Absolute: 0.7 10*3/uL (ref 0.1–0.9)
NEUTROS ABS: 3.5 10*3/uL (ref 1.4–7.0)
Neutrophils: 39 %
RBC: 4.09 x10E6/uL (ref 3.77–5.28)
RDW: 12.9 % (ref 12.3–15.4)
WBC: 9 10*3/uL (ref 3.4–10.8)

## 2018-04-07 LAB — TSH: TSH: 1.82 u[IU]/mL (ref 0.450–4.500)

## 2018-04-07 LAB — MICROALBUMIN / CREATININE URINE RATIO
CREATININE, UR: 35.9 mg/dL
Microalb/Creat Ratio: <8.4 mg/g creat (ref 0.0–30.0)
Microalbumin, Urine: <3 ug/mL

## 2018-04-07 LAB — COMPREHENSIVE METABOLIC PANEL
ALBUMIN: 4.4 g/dL (ref 3.6–4.8)
ALK PHOS: 33 IU/L — AB (ref 39–117)
ALT: 17 IU/L (ref 0–32)
AST: 16 IU/L (ref 0–40)
Albumin/Globulin Ratio: 1.3 (ref 1.2–2.2)
BILIRUBIN TOTAL: 0.3 mg/dL (ref 0.0–1.2)
BUN / CREAT RATIO: 17 (ref 12–28)
BUN: 16 mg/dL (ref 8–27)
CO2: 22 mmol/L (ref 20–29)
CREATININE: 0.95 mg/dL (ref 0.57–1.00)
Calcium: 10.4 mg/dL — ABNORMAL HIGH (ref 8.7–10.3)
Chloride: 102 mmol/L (ref 96–106)
GFR calc non Af Amer: 64 mL/min/{1.73_m2} (ref >59)
GFR, EST AFRICAN AMERICAN: 74 mL/min/{1.73_m2} (ref >59)
GLOBULIN, TOTAL: 3.5 g/dL (ref 1.5–4.5)
GLUCOSE: 88 mg/dL (ref 65–99)
Potassium: 4.1 mmol/L (ref 3.5–5.2)
SODIUM: 140 mmol/L (ref 134–144)
TOTAL PROTEIN: 7.9 g/dL (ref 6.0–8.5)

## 2018-04-07 LAB — LIPID PANEL WITH LDL/HDL RATIO
CHOLESTEROL TOTAL: 152 mg/dL (ref 100–199)
HDL: 42 mg/dL (ref >39)
LDL CALC: 81 mg/dL (ref 0–99)
LDl/HDL Ratio: 1.9 ratio (ref 0.0–3.2)
Triglycerides: 144 mg/dL (ref 0–149)
VLDL CHOLESTEROL CAL: 29 mg/dL (ref 5–40)

## 2018-04-07 LAB — HEMOGLOBIN A1C
Est. average glucose Bld gHb Est-mCnc: 123 mg/dL
Hgb A1c MFr Bld: 5.9 % — ABNORMAL HIGH (ref 4.8–5.6)

## 2018-04-07 LAB — FOLATE: Folate: >20 ng/mL (ref >3.0)

## 2018-04-07 LAB — T4, FREE: Free T4: 1.21 ng/dL (ref 0.82–1.77)

## 2018-04-07 LAB — INSULIN, RANDOM: INSULIN: 18.1 u[IU]/mL (ref 2.6–24.9)

## 2018-04-07 LAB — VITAMIN B12: Vitamin B-12: 392 pg/mL (ref 232–1245)

## 2018-04-07 LAB — T3: T3, Total: 112 ng/dL (ref 71–180)

## 2018-04-07 LAB — VITAMIN D 25 HYDROXY (VIT D DEFICIENCY, FRACTURES): Vit D, 25-Hydroxy: 30.6 ng/mL (ref 30.0–100.0)

## 2018-04-20 ENCOUNTER — Ambulatory Visit (INDEPENDENT_AMBULATORY_CARE_PROVIDER_SITE_OTHER): Payer: BLUE CROSS/BLUE SHIELD | Admitting: Family Medicine

## 2018-04-20 VITALS — BP 113/67 | HR 66 | Temp 97.8°F | Ht 65.0 in | Wt 196.0 lb

## 2018-04-20 DIAGNOSIS — Z9189 Other specified personal risk factors, not elsewhere classified: Secondary | ICD-10-CM

## 2018-04-20 DIAGNOSIS — Z6832 Body mass index (BMI) 32.0-32.9, adult: Secondary | ICD-10-CM

## 2018-04-20 DIAGNOSIS — E119 Type 2 diabetes mellitus without complications: Secondary | ICD-10-CM

## 2018-04-20 DIAGNOSIS — E559 Vitamin D deficiency, unspecified: Secondary | ICD-10-CM

## 2018-04-20 DIAGNOSIS — E669 Obesity, unspecified: Secondary | ICD-10-CM

## 2018-04-20 MED ORDER — VITAMIN D (ERGOCALCIFEROL) 1.25 MG (50000 UNIT) PO CAPS: 50000.0000 [IU] | ORAL_CAPSULE | ORAL | 0 refills | Status: DC

## 2018-04-21 NOTE — Progress Notes (Signed)
Office: [phone]  /  Fax: [phone]   HPI:   Chief Complaint: OBESITY [name] [name] is here to discuss her progress with her obesity treatment plan. She is on the Category 2 plan +100 calories and is following her eating plan approximately 90 % of the time. She states she is exercising on the elliptical and biking 30 minutes 4 times per week. [name] [name] did well with weight loss, but she struggled to follow breakfast. She takes a lot of supplements and she would like to use a protein powder in smoothies, but the sugar content is not ideal for a patient with type 2 diabetes. Her weight is 196 lb (88.9 kg) today and has had a weight loss of 3 pounds over a period of 2 weeks since her last visit. She has lost 3 lbs since starting treatment with [place].  Vitamin D deficiency [name] [name] has a diagnosis of vitamin D deficiency. She is currently taking multi vitamin and vitamin D prescription every other week, but her level is not at goal. [name] [name] admits to fatigue and denies nausea, vomiting or muscle weakness.  Diabetes II [name] [name] has a diagnosis of diabetes type II. [name] [name] does not have a blood sugar log today.  Last A1c was at 5.9 She has been working on intensive lifestyle modifications including diet, exercise, and weight loss to help control her blood glucose levels. She is currently on Janumet. She denies nausea, vomiting or hypoglycemia.  At risk for cardiovascular disease [name] [name] is at a higher than average risk for cardiovascular disease due to obesity and diabetes. She currently denies any chest pain.  ALLERGIES: Allergies  Allergen Reactions  . Adhesive [Tape] Dermatitis  . Latex Dermatitis  . Codeine Nausea And Vomiting  . Erythromycin Itching and Swelling  . Amoxicillin Diarrhea  . Azithromycin   . Macrolides And Ketolides   . Other     FOOD ALLERGIES MSG  Green peas Onions cucumber Mustard Peanuts Corn sesame Yellow onions lettuce RED DYE NO PERFUMES  . Peanut-Containing Drug  Products   . Potassium Clavulanate [Clavulanic Acid]   . Augmentin [Amoxicillin-Pot Clavulanate] Diarrhea    Per discussion 07/26/2012, pt tolerates Omnicef well.  . Iodinated Diagnostic Agents Itching    Tolerates iodinated contrast with Benadryl 50mg  PO prior to administration.  jkl    MEDICATIONS: Current Outpatient Medications on File Prior to Visit  Medication Sig Dispense Refill  . Amlodipine-Valsartan-HCTZ 5-160-12.5 MG TABS Take 1 tablet by mouth at bedtime.    Marland Kitchen aspirin EC 325 MG tablet Take 1 tablet (325 mg total) by mouth daily. 30 tablet 0  . atorvastatin (LIPITOR) 40 MG tablet Take 40 mg by mouth daily.    . carisoprodol (SOMA) 350 MG tablet Take 350 mg by mouth 4 (four) times daily as needed for muscle spasms.    . cyclobenzaprine (FLEXERIL) 5 MG tablet Take 5 mg by mouth 3 (three) times daily as needed for muscle spasms.    . diphenhydrAMINE (BENADRYL) 25 mg capsule Take 25 mg by mouth at bedtime as needed.    Marland Kitchen EPINEPHRINE, ANAPHYLAXIS THERAPY AGENTS, Inject 0.3 mg into the muscle.    . estradiol (ESTRACE) 1 MG tablet Take 1 mg by mouth daily.    . fenofibrate (TRICOR) 145 MG tablet Take 145 mg by mouth daily.    . fluticasone (FLONASE) 50 MCG/ACT nasal spray Place 1 spray into both nostrils daily.    Marland Kitchen gabapentin (NEURONTIN) 300 MG capsule Take 300 mg by mouth at bedtime.    Marland Kitchen  ibuprofen (ADVIL,MOTRIN) 800 MG tablet Take 800 mg by mouth every 8 (eight) hours as needed for moderate pain.    Marland Kitchen latanoprost (XALATAN) 0.005 % ophthalmic solution Place 1 drop into both eyes at bedtime.    . montelukast (SINGULAIR) 10 MG tablet Take 10 mg by mouth at bedtime.    . potassium chloride SA (K-DUR,KLOR-CON) 20 MEQ tablet Take 20 mEq by mouth daily.    . predniSONE (DELTASONE) 50 MG tablet Take 1 tab 13 hours before ct scan, then 1 tab at 7 hours prior to ct scan, then 1 tab 1 hour prior to ct scan. 3 tablet 0  . SitaGLIPtin-MetFORMIN HCl [id number] MG TB24 Take 1 tablet by mouth at  bedtime.    . traMADol (ULTRAM) 50 MG tablet Take 50 mg by mouth every 6 (six) hours as needed for pain.     No current facility-administered medications on file prior to visit.     PAST MEDICAL HISTORY: Past Medical History:  Diagnosis Date  . Angiomyolipoma of left kidney   . Arthritis    left foot  . Bronchitis   . Complication of anesthesia    Patient woke up during bunionectomy and breast cyst removal surgery  . Diabetes mellitus without complication ([place])   . Environmental and seasonal allergies   . GERD (gastroesophageal reflux disease)   . Glaucoma    Bilateral  . Hyperlipemia   . Hypertension   . Lower back pain   . Mini stroke ([place])   . Multiple food allergies    MSG, green peas, onions, cucumber, mustard, peanuts, corn, sesame, lettuce, red dye, perfumes  . Optic nerve disorder    left eye  . Pneumonia 2014  . PONV (postoperative nausea and vomiting)    Slight nausea  . Prediabetes   . Rheumatoid arthritis ([place])   . Sleep apnea   . Vitamin D deficiency     PAST SURGICAL HISTORY: Past Surgical History:  Procedure Laterality Date  . ABDOMINAL HYSTERECTOMY    . BACK SURGERY     X 4  . BREAST CYST EXCISION Bilateral    20+ years ago  . BUNIONECTOMY Bilateral   . CATARACT EXTRACTION, BILATERAL Bilateral 2019   with lens implant; Dr. Bing Plume. June 2019 Left eye; July 2019 Right eye.  Marland Kitchen COLONOSCOPY    . FINGER SURGERY Left    Thumb    SOCIAL HISTORY: Social History   Tobacco Use  . Smoking status: Never Smoker  . Smokeless tobacco: Never Used  Substance Use Topics  . Alcohol use: Yes    Alcohol/week: 0.0 standard drinks    Comment: once a year  . Drug use: No    FAMILY HISTORY: Family History  Adopted: Yes  Problem Relation Age of Onset  . Hyperlipidemia Mother   . Hypertension Mother   . Colon cancer Neg Hx   . Stomach cancer Neg Hx   . Esophageal cancer Neg Hx     ROS: Review of Systems  Constitutional: Positive for malaise/fatigue  and weight loss.  Cardiovascular: Negative for chest pain.  Gastrointestinal: Negative for nausea and vomiting.  Musculoskeletal:       Negative for muscle weakness  Endo/Heme/Allergies:       Negative for hypoglycemia    PHYSICAL EXAM: Blood pressure 113/67, pulse 66, temperature 97.8 F (36.6 C), temperature source Oral, height 5\' 5"  (1.651 m), weight 196 lb (88.9 kg), SpO2 99 %. Body mass index is 32.62 kg/m. Physical Exam  Constitutional: She  is oriented to person, place, and time. She appears well-developed and well-nourished.  Cardiovascular: Normal rate.  Pulmonary/Chest: Effort normal.  Musculoskeletal: Normal range of motion.  Neurological: She is oriented to person, place, and time.  Skin: Skin is warm and dry.  Psychiatric: She has a normal mood and affect. Her behavior is normal.  Vitals reviewed.   RECENT LABS AND TESTS: BMET    Component Value Date/Time   NA 140 04/06/2018 1028   K 4.1 04/06/2018 1028   CL 102 04/06/2018 1028   CO2 22 04/06/2018 1028   GLUCOSE 88 04/06/2018 1028   GLUCOSE 96 12/09/2017 1020   BUN 16 04/06/2018 1028   CREATININE 0.95 04/06/2018 1028   CALCIUM 10.4 (H) 04/06/2018 1028   GFRNONAA 64 04/06/2018 1028   GFRAA 74 04/06/2018 1028   Lab Results  Component Value Date   HGBA1C 5.9 (H) 04/06/2018   HGBA1C 6.0 (H) 02/09/2015   Lab Results  Component Value Date   INSULIN 18.1 04/06/2018   CBC    Component Value Date/Time   WBC 9.0 04/06/2018 1028   WBC 9.2 12/09/2017 1020   RBC 4.09 04/06/2018 1028   RBC 3.97 12/09/2017 1020   HGB 12.5 04/06/2018 1028   HCT 39.0 04/06/2018 1028   PLT 390.0 12/09/2017 1020   MCV 95 04/06/2018 1028   MCH 30.6 04/06/2018 1028   MCH 32.0 02/09/2015 1103   MCHC 32.1 04/06/2018 1028   MCHC 33.4 12/09/2017 1020   RDW 12.9 04/06/2018 1028   LYMPHSABS 4.4 (H) 04/06/2018 1028   MONOABS 0.7 12/09/2017 1020   EOSABS 0.2 04/06/2018 1028   BASOSABS 0.1 04/06/2018 1028   Iron/TIBC/Ferritin/  %Sat No results found for: IRON, TIBC, FERRITIN, IRONPCTSAT Lipid Panel     Component Value Date/Time   CHOL 152 04/06/2018 1028   TRIG 144 04/06/2018 1028   HDL 42 04/06/2018 1028   LDLCALC 81 04/06/2018 1028   Hepatic Function Panel     Component Value Date/Time   PROT 7.9 04/06/2018 1028   ALBUMIN 4.4 04/06/2018 1028   AST 16 04/06/2018 1028   ALT 17 04/06/2018 1028   ALKPHOS 33 (L) 04/06/2018 1028   BILITOT 0.3 04/06/2018 1028   BILIDIR 0.1 10/02/2006 1620      Component Value Date/Time   TSH 1.820 04/06/2018 1028   TSH 0.62 10/02/2006 1620   Results for LILITH, SOLANA (MRN [id number]) as of 04/21/2018 14:04  Ref. Range 04/06/2018 10:28  Vitamin D, 25-Hydroxy Latest Ref Range: 30.0 - 100.0 ng/mL 30.6   ASSESSMENT AND PLAN: Vitamin D deficiency - Plan: Vitamin D, Ergocalciferol, (DRISDOL) 1.25 MG (50000 UT) CAPS capsule  Type 2 diabetes mellitus without complication, without long-term current use of insulin (HCC)  At risk for heart disease  Class 1 obesity with serious comorbidity and body mass index (BMI) of 32.0 to 32.9 in adult, unspecified obesity type  PLAN:  Vitamin D Deficiency [name] [name] was informed that low vitamin D levels contributes to fatigue and are associated with obesity, breast, and colon cancer. She agrees to start prescription Vit D @50 ,000 IU every week #4 with no refills and will follow up for routine testing of vitamin D, at least 2-3 times per year. She was informed of the risk of over-replacement of vitamin D and agrees to not increase her dose unless she discusses this with [place] first.  Diabetes II [name] [name] has been given extensive diabetes education by myself today including ideal fasting and post-prandial blood glucose readings, individual  ideal Hgb A1c goals and hypoglycemia prevention. We discussed the importance of good blood sugar control to decrease the likelihood of diabetic complications such as nephropathy, neuropathy, limb loss, blindness,  coronary artery disease, and death. We discussed the importance of intensive lifestyle modification including diet, exercise and weight loss as the first line treatment for diabetes. [name] [name] agrees to continue [employer]. She agrees to follow up with our clinic in 2 weeks..  Cardiovascular risk counseling [name] [name] was given extended (30 minutes) coronary artery disease prevention counseling today. She is 62 y.o. female and has risk factors for heart disease including obesity and diabetes. We discussed intensive lifestyle modifications today with an emphasis on specific weight loss instructions and strategies. Pt was also informed of the importance of increasing exercise and decreasing saturated fats to help prevent heart disease.  Obesity [name] [name] is currently in the action stage of change. As such, her goal is to continue with weight loss efforts She has agreed to follow the Category 2 plan [name] [name] has been instructed to work up to a goal of 150 minutes of combined cardio and strengthening exercise per week for weight loss and overall health benefits. We discussed the following Behavioral Modification Strategies today: increasing lean protein intake, decreasing simple carbohydrates , work on meal planning and easy cooking plans and holiday eating strategies   [name] [name] has agreed to follow up with our clinic in 2 weeks. She was informed of the importance of frequent follow up visits to maximize her success with intensive lifestyle modifications for her multiple health conditions.   OBESITY BEHAVIORAL INTERVENTION VISIT  Today's visit was # 2   Starting weight: 199 lbs Starting date: 04/06/2018 Today's weight : 196 lbs  Today's date: 04/20/2018 Total lbs lost to date: 3   ASK: We discussed the diagnosis of obesity with [name] [name] today and [name] [name] agreed to give [place] permission to discuss obesity behavioral modification therapy today.  ASSESS: [name] [name] has the diagnosis of obesity and her  BMI today is 32.62 [name] [name] is in the action stage of change   ADVISE: [name] [name] was educated on the multiple health risks of obesity as well as the benefit of weight loss to improve her health. She was advised of the need for long term treatment and the importance of lifestyle modifications to improve her current health and to decrease her risk of future health problems.  AGREE: Multiple dietary modification options and treatment options were discussed and  [name] [name] agreed to follow the recommendations documented in the above note.  ARRANGE: [name] [name] was educated on the importance of frequent visits to treat obesity as outlined per CMS and USPSTF guidelines and agreed to schedule her next follow up appointment today.  I, Doreene Nest, am acting as transcriptionist for Dennard Nip, MD  I have reviewed the above documentation for accuracy and completeness, and I agree with the above. -Dennard Nip, MD

## 2018-05-04 ENCOUNTER — Ambulatory Visit (INDEPENDENT_AMBULATORY_CARE_PROVIDER_SITE_OTHER): Payer: BLUE CROSS/BLUE SHIELD | Admitting: Family Medicine

## 2018-05-04 ENCOUNTER — Encounter (INDEPENDENT_AMBULATORY_CARE_PROVIDER_SITE_OTHER): Payer: Self-pay | Admitting: Family Medicine

## 2018-05-04 VITALS — BP 113/65 | HR 95 | Temp 98.1°F | Ht 65.0 in | Wt 197.0 lb

## 2018-05-04 DIAGNOSIS — Z9189 Other specified personal risk factors, not elsewhere classified: Secondary | ICD-10-CM | POA: Diagnosis not present

## 2018-05-04 DIAGNOSIS — Z6832 Body mass index (BMI) 32.0-32.9, adult: Secondary | ICD-10-CM

## 2018-05-04 DIAGNOSIS — E559 Vitamin D deficiency, unspecified: Secondary | ICD-10-CM | POA: Diagnosis not present

## 2018-05-04 DIAGNOSIS — K5909 Other constipation: Secondary | ICD-10-CM | POA: Diagnosis not present

## 2018-05-04 DIAGNOSIS — E119 Type 2 diabetes mellitus without complications: Secondary | ICD-10-CM

## 2018-05-04 DIAGNOSIS — E669 Obesity, unspecified: Secondary | ICD-10-CM

## 2018-05-06 ENCOUNTER — Encounter (INDEPENDENT_AMBULATORY_CARE_PROVIDER_SITE_OTHER): Payer: Self-pay | Admitting: Family Medicine

## 2018-05-06 NOTE — Progress Notes (Signed)
Office: [phone]  /  Fax: [phone]   HPI:   Chief Complaint: OBESITY [name] [name] is here to discuss her progress with her obesity treatment plan. She is on the  follow the Category 2 plan and is following her eating plan approximately 98 % of the time. She states she is exercising by doing the bicycle and elliptical for 60 minutes 4 times per week. [name] [name] is eating all the food on her plan.  Her weight is 197 lb (89.4 kg) today and has had a weight gain of 1 pounds over a period of 2 weeks since her last visit. She has lost 2 lbs since starting treatment with [place].  Diabetes II [name] [name] has a diagnosis of diabetes type II. [name] [name]'s diabetes is well controlled on Janumet and she denies any hypoglycemic episodes. She does not check CBGs at home.  Last A1c was 5.9. She has been working on intensive lifestyle modifications including diet, exercise, and weight loss to help control her blood glucose levels.  Constipation [name] [name] notes constipation for the last few weeks, worse since attempting weight loss. She states BM are less frequent and are not hard and painful. She denies hematochezia or melena. She denies drinking less H20 recently.  Vitamin D deficiency [name] [name] has a diagnosis of vitamin D deficiency. She is currently taking vit D and denies nausea, vomiting or muscle weakness.  At risk for osteopenia and osteoporosis [name] [name] is at higher risk of osteopenia and osteoporosis due to vitamin D deficiency.   ALLERGIES: Allergies  Allergen Reactions  . Adhesive [Tape] Dermatitis  . Latex Dermatitis  . Codeine Nausea And Vomiting  . Erythromycin Itching and Swelling  . Amoxicillin Diarrhea  . Azithromycin   . Macrolides And Ketolides   . Other     FOOD ALLERGIES MSG  Green peas Onions cucumber Mustard Peanuts Corn sesame Yellow onions lettuce RED DYE NO PERFUMES  . Peanut-Containing Drug Products   . Potassium Clavulanate [Clavulanic Acid]   . Augmentin [Amoxicillin-Pot  Clavulanate] Diarrhea    Per discussion 07/26/2012, pt tolerates Omnicef well.  . Iodinated Diagnostic Agents Itching    Tolerates iodinated contrast with Benadryl 50mg  PO prior to administration.  jkl    MEDICATIONS: Current Outpatient Medications on File Prior to Visit  Medication Sig Dispense Refill  . Amlodipine-Valsartan-HCTZ 5-160-12.5 MG TABS Take 1 tablet by mouth at bedtime.    Marland Kitchen aspirin EC 325 MG tablet Take 1 tablet (325 mg total) by mouth daily. 30 tablet 0  . atorvastatin (LIPITOR) 40 MG tablet Take 40 mg by mouth daily.    . carisoprodol (SOMA) 350 MG tablet Take 350 mg by mouth 4 (four) times daily as needed for muscle spasms.    . cyclobenzaprine (FLEXERIL) 5 MG tablet Take 5 mg by mouth 3 (three) times daily as needed for muscle spasms.    . diphenhydrAMINE (BENADRYL) 25 mg capsule Take 25 mg by mouth at bedtime as needed.    Marland Kitchen EPINEPHRINE, ANAPHYLAXIS THERAPY AGENTS, Inject 0.3 mg into the muscle.    . estradiol (ESTRACE) 1 MG tablet Take 1 mg by mouth daily.    . fenofibrate (TRICOR) 145 MG tablet Take 145 mg by mouth daily.    . fluticasone (FLONASE) 50 MCG/ACT nasal spray Place 1 spray into both nostrils daily.    Marland Kitchen gabapentin (NEURONTIN) 300 MG capsule Take 300 mg by mouth at bedtime.    Marland Kitchen ibuprofen (ADVIL,MOTRIN) 800 MG tablet Take 800 mg by mouth every 8 (eight) hours as needed  for moderate pain.    Marland Kitchen latanoprost (XALATAN) 0.005 % ophthalmic solution Place 1 drop into both eyes at bedtime.    . montelukast (SINGULAIR) 10 MG tablet Take 10 mg by mouth at bedtime.    . potassium chloride SA (K-DUR,KLOR-CON) 20 MEQ tablet Take 20 mEq by mouth daily.    . predniSONE (DELTASONE) 50 MG tablet Take 1 tab 13 hours before ct scan, then 1 tab at 7 hours prior to ct scan, then 1 tab 1 hour prior to ct scan. 3 tablet 0  . psyllium (METAMUCIL) 58.6 % packet Take 1 packet by mouth daily.    . SitaGLIPtin-MetFORMIN HCl [phone] MG TB24 Take 1 tablet by mouth at bedtime.    .  traMADol (ULTRAM) 50 MG tablet Take 50 mg by mouth every 6 (six) hours as needed for pain.    . Vitamin D, Ergocalciferol, (DRISDOL) 1.25 MG (50000 UT) CAPS capsule Take 1 capsule (50,000 Units total) by mouth every 7 (seven) days. 4 capsule 0   No current facility-administered medications on file prior to visit.     PAST MEDICAL HISTORY: Past Medical History:  Diagnosis Date  . Angiomyolipoma of left kidney   . Arthritis    left foot  . Bronchitis   . Complication of anesthesia    Patient woke up during bunionectomy and breast cyst removal surgery  . Diabetes mellitus without complication ([place])   . Environmental and seasonal allergies   . GERD (gastroesophageal reflux disease)   . Glaucoma    Bilateral  . Hyperlipemia   . Hypertension   . Lower back pain   . Mini stroke ([place])   . Multiple food allergies    MSG, green peas, onions, cucumber, mustard, peanuts, corn, sesame, lettuce, red dye, perfumes  . Optic nerve disorder    left eye  . Pneumonia 2014  . PONV (postoperative nausea and vomiting)    Slight nausea  . Prediabetes   . Rheumatoid arthritis ([place])   . Sleep apnea   . Vitamin D deficiency     PAST SURGICAL HISTORY: Past Surgical History:  Procedure Laterality Date  . ABDOMINAL HYSTERECTOMY    . BACK SURGERY     X 4  . BREAST CYST EXCISION Bilateral    20+ years ago  . BUNIONECTOMY Bilateral   . CATARACT EXTRACTION, BILATERAL Bilateral 2019   with lens implant; Dr. Bing Plume. June 2019 Left eye; July 2019 Right eye.  Marland Kitchen COLONOSCOPY    . FINGER SURGERY Left    Thumb    SOCIAL HISTORY: Social History   Tobacco Use  . Smoking status: Never Smoker  . Smokeless tobacco: Never Used  Substance Use Topics  . Alcohol use: Yes    Alcohol/week: 0.0 standard drinks    Comment: once a year  . Drug use: No    FAMILY HISTORY: Family History  Adopted: Yes  Problem Relation Age of Onset  . Hyperlipidemia Mother   . Hypertension Mother   . Colon cancer Neg Hx    . Stomach cancer Neg Hx   . Esophageal cancer Neg Hx     ROS: Review of Systems  Constitutional: Negative for weight loss.  Gastrointestinal: Negative for melena, nausea and vomiting.       Negative for hematochezia  Musculoskeletal:       Negative for muscle weakness  Endo/Heme/Allergies:       Negative for hypoglycemia    PHYSICAL EXAM: Blood pressure 113/65, pulse 95, temperature 98.1 F (36.7  C), temperature source Oral, height 5\' 5"  (1.651 m), weight 197 lb (89.4 kg), SpO2 95 %. Body mass index is 32.78 kg/m. Physical Exam  Constitutional: She is oriented to person, place, and time. She appears well-developed and well-nourished.  HENT:  Head: Normocephalic.  Eyes: Pupils are equal, round, and reactive to light.  Neck: Normal range of motion.  Cardiovascular: Normal rate.  Pulmonary/Chest: Effort normal.  Musculoskeletal: Normal range of motion.  Neurological: She is alert and oriented to person, place, and time.  Skin: Skin is warm and dry.  Psychiatric: She has a normal mood and affect. Her behavior is normal.  Vitals reviewed.   RECENT LABS AND TESTS: BMET    Component Value Date/Time   NA 140 04/06/2018 1028   K 4.1 04/06/2018 1028   CL 102 04/06/2018 1028   CO2 22 04/06/2018 1028   GLUCOSE 88 04/06/2018 1028   GLUCOSE 96 12/09/2017 1020   BUN 16 04/06/2018 1028   CREATININE 0.95 04/06/2018 1028   CALCIUM 10.4 (H) 04/06/2018 1028   GFRNONAA 64 04/06/2018 1028   GFRAA 74 04/06/2018 1028   Lab Results  Component Value Date   HGBA1C 5.9 (H) 04/06/2018   HGBA1C 6.0 (H) 02/09/2015   Lab Results  Component Value Date   INSULIN 18.1 04/06/2018   CBC    Component Value Date/Time   WBC 9.0 04/06/2018 1028   WBC 9.2 12/09/2017 1020   RBC 4.09 04/06/2018 1028   RBC 3.97 12/09/2017 1020   HGB 12.5 04/06/2018 1028   HCT 39.0 04/06/2018 1028   PLT 390.0 12/09/2017 1020   MCV 95 04/06/2018 1028   MCH 30.6 04/06/2018 1028   MCH 32.0 02/09/2015 1103    MCHC 32.1 04/06/2018 1028   MCHC 33.4 12/09/2017 1020   RDW 12.9 04/06/2018 1028   LYMPHSABS 4.4 (H) 04/06/2018 1028   MONOABS 0.7 12/09/2017 1020   EOSABS 0.2 04/06/2018 1028   BASOSABS 0.1 04/06/2018 1028   Iron/TIBC/Ferritin/ %Sat No results found for: IRON, TIBC, FERRITIN, IRONPCTSAT Lipid Panel     Component Value Date/Time   CHOL 152 04/06/2018 1028   TRIG 144 04/06/2018 1028   HDL 42 04/06/2018 1028   LDLCALC 81 04/06/2018 1028   Hepatic Function Panel     Component Value Date/Time   PROT 7.9 04/06/2018 1028   ALBUMIN 4.4 04/06/2018 1028   AST 16 04/06/2018 1028   ALT 17 04/06/2018 1028   ALKPHOS 33 (L) 04/06/2018 1028   BILITOT 0.3 04/06/2018 1028   BILIDIR 0.1 10/02/2006 1620      Component Value Date/Time   TSH 1.820 04/06/2018 1028   TSH 0.62 10/02/2006 1620    Ref. Range 04/06/2018 10:28  Vitamin D, 25-Hydroxy Latest Ref Range: 30.0 - 100.0 ng/mL 30.6    ASSESSMENT AND PLAN: Type 2 diabetes mellitus without complication, without long-term current use of insulin (HCC)  Other constipation  Vitamin D deficiency  At risk for osteoporosis  Class 1 obesity with serious comorbidity and body mass index (BMI) of 32.0 to 32.9 in adult, unspecified obesity type  PLAN: Diabetes II [name] [name] has been given extensive diabetes education by myself today including ideal fasting and post-prandial blood glucose readings, individual ideal HgA1c goals  and hypoglycemia prevention. We discussed the importance of good blood sugar control to decrease the likelihood of diabetic complications such as nephropathy, neuropathy, limb loss, blindness, coronary artery disease, and death. We discussed the importance of intensive lifestyle modification including diet, exercise and weight loss as  the first line treatment for diabetes. [name] [name] agrees to continue her diabetes medications and will follow up at the agreed upon time.  Constipation [name] [name] was informed decrease bowel movement  frequency is normal while losing weight, but stools should not be hard or painful. She was advised to increase her H20 intake and work on increasing her fiber intake. High fiber foods were discussed today. She will start metamucil daily.   Vitamin D Deficiency [name] [name] was informed that low vitamin D levels contributes to fatigue and are associated with obesity, breast, and colon cancer. She agrees to continue to take prescription Vit D @50 ,000 IU every week, no refill needed and will follow up for routine testing of vitamin D, at least 2-3 times per year. She was informed of the risk of over-replacement of vitamin D and agrees to not increase her dose unless she discusses this with [place] first. Agrees to follow up with our clinic as directed.   At risk for osteopenia and osteoporosis [name] [name] was given extended  (15 minutes) osteoporosis prevention counseling today. [name] [name] is at risk for osteopenia and osteoporosis due to her vitamin D deficiency. She was encouraged to take her vitamin D and follow her higher calcium diet and increase strengthening exercise to help strengthen her bones and decrease her risk of osteopenia and osteoporosis.  Obesity [name] [name] is currently in the action stage of change. As such, her goal is to continue with weight loss efforts She has agreed to follow the Category 2 plan  She may substitute 1/2 to 3/4 cup of sweet potato for 2 slices of bread.  [name] [name] has been instructed to continue using bicycle and elliptical for 60 minutes 4 time per week for weight loss and overall health benefits. We discussed the following Behavioral Modification Strategies today: work on meal planning and easy cooking plans, increasing water, and planning for success.    [name] [name] has agreed to follow up with our clinic in 2 weeks. She was informed of the importance of frequent follow up visits to maximize her success with intensive lifestyle modifications for her multiple health conditions.   OBESITY  BEHAVIORAL INTERVENTION VISIT  Today's visit was # 3   Starting weight: 199 lb Starting date: 04/06/18 Today's weight : Weight: 197 lb (89.4 kg)  Today's date: 05/04/18 Total lbs lost to date: 2 lb    ASK: We discussed the diagnosis of obesity with [name] [name] today and [name] [name] agreed to give [place] permission to discuss obesity behavioral modification therapy today.  ASSESS: [name] [name] has the diagnosis of obesity and her BMI today is 32.78 [name] [name] is in the action stage of change   ADVISE: [name] [name] was educated on the multiple health risks of obesity as well as the benefit of weight loss to improve her health. She was advised of the need for long term treatment and the importance of lifestyle modifications to improve her current health and to decrease her risk of future health problems.  AGREE: Multiple dietary modification options and treatment options were discussed and  [name] [name] agreed to follow the recommendations documented in the above note.  ARRANGE: [name] [name] was educated on the importance of frequent visits to treat obesity as outlined per CMS and USPHS guidelines and agreed to schedule her next follow up appointment today.  I, [name] [name], am acting as Location manager for [employer], FNP-C.  I have reviewed the above documentation for accuracy and completeness, and I agree with the above.  - Jerine Surles, FNP-C.

## 2018-05-09 ENCOUNTER — Other Ambulatory Visit (INDEPENDENT_AMBULATORY_CARE_PROVIDER_SITE_OTHER): Payer: Self-pay | Admitting: Family Medicine

## 2018-05-09 DIAGNOSIS — E559 Vitamin D deficiency, unspecified: Secondary | ICD-10-CM

## 2018-05-17 ENCOUNTER — Telehealth: Payer: Self-pay

## 2018-05-17 NOTE — Telephone Encounter (Signed)
Called the patient and looked at her data. Patient is being told by [employer] that she will have to restart the process for insurance purposes and she was under the impression that she was being noncompliant. Pt is just shy under the compliance. I advised the patient that I don't want to tell her wrong on the rules of the machine. I will reach out to the management of [employer] and see what there thoughts are. I will contact the patient back once I have heard back and spoke with them. Pt verbalized understanding.

## 2018-05-17 NOTE — Telephone Encounter (Signed)
Pt has called and LVM stating that she needs an appt with Dr. Brett Fairy ASAP. Pt stated on VM that she is being told that she in not compliant with her CPAP and is at risk of her machine being taken away.  Pt wants an appt ASAP so she can keep her machine.

## 2018-05-18 NOTE — Telephone Encounter (Signed)
Spoke with [employer] and they were able to talk with the patient and get her taken care of.

## 2018-05-19 ENCOUNTER — Encounter (INDEPENDENT_AMBULATORY_CARE_PROVIDER_SITE_OTHER): Payer: Self-pay | Admitting: Family Medicine

## 2018-05-19 ENCOUNTER — Ambulatory Visit (INDEPENDENT_AMBULATORY_CARE_PROVIDER_SITE_OTHER): Payer: BLUE CROSS/BLUE SHIELD | Admitting: Family Medicine

## 2018-05-19 ENCOUNTER — Other Ambulatory Visit: Payer: Self-pay | Admitting: Obstetrics and Gynecology

## 2018-05-19 VITALS — BP 108/66 | HR 76 | Temp 97.9°F | Ht 65.0 in | Wt 195.0 lb

## 2018-05-19 DIAGNOSIS — Z1231 Encounter for screening mammogram for malignant neoplasm of breast: Secondary | ICD-10-CM

## 2018-05-19 DIAGNOSIS — E119 Type 2 diabetes mellitus without complications: Secondary | ICD-10-CM

## 2018-05-19 DIAGNOSIS — E669 Obesity, unspecified: Secondary | ICD-10-CM

## 2018-05-19 DIAGNOSIS — Z9189 Other specified personal risk factors, not elsewhere classified: Secondary | ICD-10-CM | POA: Diagnosis not present

## 2018-05-19 DIAGNOSIS — E559 Vitamin D deficiency, unspecified: Secondary | ICD-10-CM | POA: Diagnosis not present

## 2018-05-19 DIAGNOSIS — Z6832 Body mass index (BMI) 32.0-32.9, adult: Secondary | ICD-10-CM

## 2018-05-19 MED ORDER — VITAMIN D (ERGOCALCIFEROL) 1.25 MG (50000 UNIT) PO CAPS: 50000.0000 [IU] | ORAL_CAPSULE | ORAL | 0 refills | Status: DC

## 2018-05-19 NOTE — Progress Notes (Signed)
Office: [phone]  /  Fax: [phone]   HPI:   Chief Complaint: OBESITY Eathel is here to discuss her progress with her obesity treatment plan. She is on the Category 2 plan and is following her eating plan approximately 90 % of the time. She states she is using the elliptical and riding the bike 120 minutes 7 times per week. Latora has done well over the holidays. She is planning to travel over Christmas, but feels that she will be able to stick to the plan.  Her weight is 195 lb (88.5 kg) today and has had a weight loss of 2 pounds over a period of 2 weeks since her last visit. She has lost 4 lbs since starting treatment with [place].  Diabetes II Emalia has a diagnosis of diabetes type II which is well contolled. Nathasha does not check sugars. She is on sitagliptin/metformin (Janumet) 100/1000mg   and denies any hypoglycemic episodes. Last A1c was 5.9 on 04/06/18. She sees endocrinology once a year. She has been working on intensive lifestyle modifications including diet, exercise, and weight loss to help control her blood glucose levels.  Vitamin D deficiency Brin has a diagnosis of vitamin D deficiency. She is currently taking vit D, but is not at goal. Her last vitamin D level was 30.6 on 04/06/18. She denies nausea, vomiting, or muscle weakness.  At risk for osteopenia and osteoporosis Anvi is at higher risk of osteopenia and osteoporosis due to vitamin D deficiency.   ASSESSMENT AND PLAN:  Vitamin D deficiency - Plan: Vitamin D, Ergocalciferol, (DRISDOL) 1.25 MG (50000 UT) CAPS capsule  Type 2 diabetes mellitus without complication, without long-term current use of insulin (HCC)  At risk for osteoporosis  Class 1 obesity with serious comorbidity and body mass index (BMI) of 32.0 to 32.9 in adult, unspecified obesity type  PLAN:  Diabetes II Laurelyn has been given extensive diabetes education by myself today including ideal fasting and post-prandial blood glucose readings,  individual ideal Hgb A1c goals, and hypoglycemia prevention. We discussed the importance of good blood sugar control to decrease the likelihood of diabetic complications such as nephropathy, neuropathy, limb loss, blindness, coronary artery disease, and death. We discussed the importance of intensive lifestyle modification including diet, exercise and weight loss as the first line treatment for diabetes. Danaya agrees to continue her Janumet, diet, and will follow up at the agreed upon time in 3 weeks.  Vitamin D Deficiency Jazmyn was informed that low vitamin D levels contributes to fatigue and are associated with obesity, breast, and colon cancer. She agrees to continue to take prescription Vit D @50 ,000 IU every week #4 with no refills and will follow up for routine testing of vitamin D, at least 2-3 times per year. She was informed of the risk of over-replacement of vitamin D and agrees to not increase her dose unless she discusses this with [place] first. Gwendola agrees to follow up as directed.  At risk for osteopenia and osteoporosis Riven was given extended (15 minutes) osteoporosis prevention counseling today. Shaida is at risk for osteopenia and osteoporosis due to her vitamin D deficiency. She was encouraged to take her vitamin D and follow her higher calcium diet and increase strengthening exercise to help strengthen her bones and decrease her risk of osteopenia and osteoporosis.  Obesity Chrishauna is currently in the action stage of change. As such, her goal is to continue with weight loss efforts. She has agreed to follow the Category 2 plan. Anberlyn has been instructed  to continue using the bike and the elliptical 7 times per week. We discussed the following Behavioral Modification Strategies today: celebration eating strategies and planning for success.  Nilza has agreed to follow up with our clinic in 3 weeks. She was informed of the importance of frequent follow up visits to maximize her  success with intensive lifestyle modifications for her multiple health conditions.  ALLERGIES: Allergies  Allergen Reactions  . Adhesive [Tape] Dermatitis  . Latex Dermatitis  . Codeine Nausea And Vomiting  . Erythromycin Itching and Swelling  . Amoxicillin Diarrhea  . Azithromycin   . Macrolides And Ketolides   . Other     FOOD ALLERGIES MSG  Green peas Onions cucumber Mustard Peanuts Corn sesame Yellow onions lettuce RED DYE NO PERFUMES  . Peanut-Containing Drug Products   . Potassium Clavulanate [Clavulanic Acid]   . Augmentin [Amoxicillin-Pot Clavulanate] Diarrhea    Per discussion 07/26/2012, pt tolerates Omnicef well.  . Iodinated Diagnostic Agents Itching    Tolerates iodinated contrast with Benadryl 50mg  PO prior to administration.  jkl    MEDICATIONS: Current Outpatient Medications on File Prior to Visit  Medication Sig Dispense Refill  . Amlodipine-Valsartan-HCTZ 5-160-12.5 MG TABS Take 1 tablet by mouth at bedtime.    Marland Kitchen aspirin EC 325 MG tablet Take 1 tablet (325 mg total) by mouth daily. 30 tablet 0  . atorvastatin (LIPITOR) 40 MG tablet Take 40 mg by mouth daily.    . carisoprodol (SOMA) 350 MG tablet Take 350 mg by mouth 4 (four) times daily as needed for muscle spasms.    . cyclobenzaprine (FLEXERIL) 5 MG tablet Take 5 mg by mouth 3 (three) times daily as needed for muscle spasms.    . diphenhydrAMINE (BENADRYL) 25 mg capsule Take 25 mg by mouth at bedtime as needed.    Marland Kitchen EPINEPHRINE, ANAPHYLAXIS THERAPY AGENTS, Inject 0.3 mg into the muscle.    . estradiol (ESTRACE) 1 MG tablet Take 1 mg by mouth daily.    . fenofibrate (TRICOR) 145 MG tablet Take 145 mg by mouth daily.    . fluticasone (FLONASE) 50 MCG/ACT nasal spray Place 1 spray into both nostrils daily.    Marland Kitchen gabapentin (NEURONTIN) 300 MG capsule Take 300 mg by mouth at bedtime.    Marland Kitchen ibuprofen (ADVIL,MOTRIN) 800 MG tablet Take 800 mg by mouth every 8 (eight) hours as needed for moderate pain.    Marland Kitchen  latanoprost (XALATAN) 0.005 % ophthalmic solution Place 1 drop into both eyes at bedtime.    . montelukast (SINGULAIR) 10 MG tablet Take 10 mg by mouth at bedtime.    . potassium chloride SA (K-DUR,KLOR-CON) 20 MEQ tablet Take 20 mEq by mouth daily.    . predniSONE (DELTASONE) 50 MG tablet Take 1 tab 13 hours before ct scan, then 1 tab at 7 hours prior to ct scan, then 1 tab 1 hour prior to ct scan. 3 tablet 0  . psyllium (METAMUCIL) 58.6 % packet Take 1 packet by mouth daily.    . SitaGLIPtin-MetFORMIN HCl [id number] MG TB24 Take 1 tablet by mouth at bedtime.    . traMADol (ULTRAM) 50 MG tablet Take 50 mg by mouth every 6 (six) hours as needed for pain.     No current facility-administered medications on file prior to visit.     PAST MEDICAL HISTORY: Past Medical History:  Diagnosis Date  . Angiomyolipoma of left kidney   . Arthritis    left foot  . Bronchitis   . Complication  of anesthesia    Patient woke up during bunionectomy and breast cyst removal surgery  . Diabetes mellitus without complication ([place])   . Environmental and seasonal allergies   . GERD (gastroesophageal reflux disease)   . Glaucoma    Bilateral  . Hyperlipemia   . Hypertension   . Lower back pain   . Mini stroke ([place])   . Multiple food allergies    MSG, green peas, onions, cucumber, mustard, peanuts, corn, sesame, lettuce, red dye, perfumes  . Optic nerve disorder    left eye  . Pneumonia 2014  . PONV (postoperative nausea and vomiting)    Slight nausea  . Prediabetes   . Rheumatoid arthritis ([place])   . Sleep apnea   . Vitamin D deficiency     PAST SURGICAL HISTORY: Past Surgical History:  Procedure Laterality Date  . ABDOMINAL HYSTERECTOMY    . BACK SURGERY     X 4  . BREAST CYST EXCISION Bilateral    20+ years ago  . BUNIONECTOMY Bilateral   . CATARACT EXTRACTION, BILATERAL Bilateral 2019   with lens implant; Dr. Bing Plume. June 2019 Left eye; July 2019 Right eye.  Marland Kitchen COLONOSCOPY    . FINGER  SURGERY Left    Thumb    SOCIAL HISTORY: Social History   Tobacco Use  . Smoking status: Never Smoker  . Smokeless tobacco: Never Used  Substance Use Topics  . Alcohol use: Yes    Alcohol/week: 0.0 standard drinks    Comment: once a year  . Drug use: No    FAMILY HISTORY: Family History  Adopted: Yes  Problem Relation Age of Onset  . Hyperlipidemia Mother   . Hypertension Mother   . Colon cancer Neg Hx   . Stomach cancer Neg Hx   . Esophageal cancer Neg Hx     ROS: Review of Systems  Gastrointestinal: Negative for nausea and vomiting.  Musculoskeletal:       Negative for muscle weakness.  Endo/Heme/Allergies:       Negative for hypoglycemia.    PHYSICAL EXAM: Blood pressure 108/66, pulse 76, temperature 97.9 F (36.6 C), temperature source Oral, height 5\' 5"  (1.651 m), weight 195 lb (88.5 kg), SpO2 98 %. Body mass index is 32.45 kg/m. Physical Exam Vitals signs reviewed.  Constitutional:      Appearance: Normal appearance. She is obese.  Cardiovascular:     Rate and Rhythm: Normal rate.  Pulmonary:     Effort: Pulmonary effort is normal.  Musculoskeletal: Normal range of motion.  Skin:    General: Skin is warm and dry.  Neurological:     Mental Status: She is alert and oriented to person, place, and time.  Psychiatric:        Mood and Affect: Mood normal.        Behavior: Behavior normal.     RECENT LABS AND TESTS: BMET    Component Value Date/Time   NA 140 04/06/2018 1028   K 4.1 04/06/2018 1028   CL 102 04/06/2018 1028   CO2 22 04/06/2018 1028   GLUCOSE 88 04/06/2018 1028   GLUCOSE 96 12/09/2017 1020   BUN 16 04/06/2018 1028   CREATININE 0.95 04/06/2018 1028   CALCIUM 10.4 (H) 04/06/2018 1028   GFRNONAA 64 04/06/2018 1028   GFRAA 74 04/06/2018 1028   Lab Results  Component Value Date   HGBA1C 5.9 (H) 04/06/2018   HGBA1C 6.0 (H) 02/09/2015   Lab Results  Component Value Date   INSULIN 18.1  04/06/2018   CBC    Component Value  Date/Time   WBC 9.0 04/06/2018 1028   WBC 9.2 12/09/2017 1020   RBC 4.09 04/06/2018 1028   RBC 3.97 12/09/2017 1020   HGB 12.5 04/06/2018 1028   HCT 39.0 04/06/2018 1028   PLT 390.0 12/09/2017 1020   MCV 95 04/06/2018 1028   MCH 30.6 04/06/2018 1028   MCH 32.0 02/09/2015 1103   MCHC 32.1 04/06/2018 1028   MCHC 33.4 12/09/2017 1020   RDW 12.9 04/06/2018 1028   LYMPHSABS 4.4 (H) 04/06/2018 1028   MONOABS 0.7 12/09/2017 1020   EOSABS 0.2 04/06/2018 1028   BASOSABS 0.1 04/06/2018 1028   Iron/TIBC/Ferritin/ %Sat No results found for: IRON, TIBC, FERRITIN, IRONPCTSAT Lipid Panel     Component Value Date/Time   CHOL 152 04/06/2018 1028   TRIG 144 04/06/2018 1028   HDL 42 04/06/2018 1028   LDLCALC 81 04/06/2018 1028   Hepatic Function Panel     Component Value Date/Time   PROT 7.9 04/06/2018 1028   ALBUMIN 4.4 04/06/2018 1028   AST 16 04/06/2018 1028   ALT 17 04/06/2018 1028   ALKPHOS 33 (L) 04/06/2018 1028   BILITOT 0.3 04/06/2018 1028   BILIDIR 0.1 10/02/2006 1620      Component Value Date/Time   TSH 1.820 04/06/2018 1028   TSH 0.62 10/02/2006 1620   Results for KRISTILYN, COLTRANE (MRN [id number]) as of 05/19/2018 14:55  Ref. Range 04/06/2018 10:28  Vitamin D, 25-Hydroxy Latest Ref Range: 30.0 - 100.0 ng/mL 30.6    OBESITY BEHAVIORAL INTERVENTION VISIT  Today's visit was # 4   Starting weight: 199 lbs Starting date: 04/06/18 Today's weight : Weight: 195 lb (88.5 kg)  Today's date: 05/19/2018 Total lbs lost to date: 4  ASK: We discussed the diagnosis of obesity with Araceli Bouche today and Maura agreed to give [place] permission to discuss obesity behavioral modification therapy today.  ASSESS: Brittanee has the diagnosis of obesity and her BMI today is 32.4. Jannely is in the action stage of change.   ADVISE: Trea was educated on the multiple health risks of obesity as well as the benefit of weight loss to improve her health. She was advised of the need for long  term treatment and the importance of lifestyle modifications to improve her current health and to decrease her risk of future health problems.  AGREE: Multiple dietary modification options and treatment options were discussed and Kaisa agreed to follow the recommendations documented in the above note.  ARRANGE: Kaeya was educated on the importance of frequent visits to treat obesity as outlined per CMS and USPSTF guidelines and agreed to schedule her next follow up appointment today.  I, Marcille Blanco, am acting as Location manager for [employer], FNP-C.  I have reviewed the above documentation for accuracy and completeness, and I agree with the above.  - Derk Doubek, FNP-C.

## 2018-05-20 ENCOUNTER — Encounter (INDEPENDENT_AMBULATORY_CARE_PROVIDER_SITE_OTHER): Payer: Self-pay | Admitting: Family Medicine

## 2018-05-20 DIAGNOSIS — E559 Vitamin D deficiency, unspecified: Secondary | ICD-10-CM | POA: Insufficient documentation

## 2018-05-20 DIAGNOSIS — E119 Type 2 diabetes mellitus without complications: Secondary | ICD-10-CM | POA: Insufficient documentation

## 2018-06-09 ENCOUNTER — Ambulatory Visit (INDEPENDENT_AMBULATORY_CARE_PROVIDER_SITE_OTHER): Payer: BLUE CROSS/BLUE SHIELD | Admitting: Family Medicine

## 2018-06-09 ENCOUNTER — Encounter (INDEPENDENT_AMBULATORY_CARE_PROVIDER_SITE_OTHER): Payer: Self-pay

## 2018-06-12 NOTE — Progress Notes (Signed)
[facility] Note  06/15/2018     CHIEF COMPLAINT Patient presents for Retina Evaluation and Diabetic Eye Exam   HISTORY OF PRESENT ILLNESS: [name] [name] is a 63 y.o. female who presents to the clinic today for:   HPI    Retina Evaluation    In both eyes.  This started 5 months ago.  Associated Symptoms Floaters and Pain.  Negative for Flashes, Trauma, Fever, Scalp Tenderness, Weight Loss, Distortion, Photophobia, Blind Spot, Glare, Shoulder/Hip pain, Fatigue, Jaw Claudication and Redness.  Context:  distance vision, mid-range vision and near vision.  Treatments tried include surgery.  Response to treatment was mild improvement.  I, the attending physician,  performed the HPI with the patient and updated documentation appropriately.          Diabetic Eye Exam    Vision is stable.  Associated Symptoms Negative for Flashes, Pain, Trauma, Fever, Weight Loss, Scalp Tenderness, Redness, Floaters, Distortion, Photophobia, Jaw Claudication, Fatigue, Shoulder/Hip pain, Glare and Blind Spot.  Diabetes characteristics include Type 2 and taking oral medications.  This started 4.  Blood sugar level is controlled.  Last A1C 5.9.  I, the attending physician,  performed the HPI with the patient and updated documentation appropriately.          Comments    Referral of Dr. Bing Plume for retina eval C/O flashes and floaters. Patient states she had cataract sx w/ lens implants  OU 12/2017 ,since then she has been having floaters and eye pain OD. Pt  Describes the pain as a "shooting sharpe pain" OD that last a few minutes. Pt is DM2 x 4 yrs , BS are not monitored at home, A1C 5.9 (03/2018), Patient is taking Metformin/sitagliptin , BS are stable per patient.Pt reports 3 wk's ago she had red eye and her eye was swollen, she was given steroid gtt's that helped. Pt is using Lantoprost gtt's and Allergy gtt's and Systane gtt's.        Last edited by Bernarda Caffey, MD on 06/15/2018   9:53 AM. (History)    Pt reports floaters since having cataract surgery, August 2019. Reports "zappers" happen weekly  Referring physician: Calvert Cantor, MD [address] [place], [place] [address]  HISTORICAL INFORMATION:   Selected notes from the MEDICAL RECORD NUMBER Referred by Dr. Calvert Cantor for concern of flashes and flaoters OU LEE:  Ocular Hx-glaucoma, optic nerve disorder, pseudo PMH-arthritis, DM (takes metformin), HLD, HTN, hx of mini stroke, RA    CURRENT MEDICATIONS: Current Outpatient Medications (Ophthalmic Drugs)  Medication Sig  . latanoprost (XALATAN) 0.005 % ophthalmic solution Place 1 drop into both eyes at bedtime.   No current facility-administered medications for this visit.  (Ophthalmic Drugs)   Current Outpatient Medications (Other)  Medication Sig  . Amlodipine-Valsartan-HCTZ 5-160-12.5 MG TABS Take 1 tablet by mouth at bedtime.  Marland Kitchen aspirin EC 325 MG tablet Take 1 tablet (325 mg total) by mouth daily.  Marland Kitchen atorvastatin (LIPITOR) 40 MG tablet Take 40 mg by mouth daily.  . carisoprodol (SOMA) 350 MG tablet Take 350 mg by mouth 4 (four) times daily as needed for muscle spasms.  . cyclobenzaprine (FLEXERIL) 5 MG tablet Take 5 mg by mouth 3 (three) times daily as needed for muscle spasms.  . diphenhydrAMINE (BENADRYL) 25 mg capsule Take 25 mg by mouth at bedtime as needed.  Marland Kitchen EPINEPHRINE, ANAPHYLAXIS THERAPY AGENTS, Inject 0.3 mg into the muscle.  . estradiol (ESTRACE) 1 MG tablet  Take 1 mg by mouth daily.  . fenofibrate (TRICOR) 145 MG tablet Take 145 mg by mouth daily.  . fluticasone (FLONASE) 50 MCG/ACT nasal spray Place 1 spray into both nostrils daily.  Marland Kitchen gabapentin (NEURONTIN) 300 MG capsule Take 300 mg by mouth at bedtime.  Marland Kitchen ibuprofen (ADVIL,MOTRIN) 800 MG tablet Take 800 mg by mouth every 8 (eight) hours as needed for moderate pain.  . montelukast (SINGULAIR) 10 MG tablet Take 10 mg by mouth at bedtime.  . potassium chloride SA  (K-DUR,KLOR-CON) 20 MEQ tablet Take 20 mEq by mouth daily.  . predniSONE (DELTASONE) 50 MG tablet Take 1 tab 13 hours before ct scan, then 1 tab at 7 hours prior to ct scan, then 1 tab 1 hour prior to ct scan.  . psyllium (METAMUCIL) 58.6 % packet Take 1 packet by mouth daily.  . SitaGLIPtin-MetFORMIN HCl 615 733 [id number] MG TB24 Take 1 tablet by mouth at bedtime.  . traMADol (ULTRAM) 50 MG tablet Take 50 mg by mouth every 6 (six) hours as needed for pain.  . Vitamin D, Ergocalciferol, (DRISDOL) 1.25 MG (50000 UT) CAPS capsule Take 1 capsule (50,000 Units total) by mouth every 7 (seven) days.   No current facility-administered medications for this visit.  (Other)      REVIEW OF SYSTEMS: ROS    Positive for: Endocrine, Eyes   Negative for: Constitutional, Gastrointestinal, Neurological, Skin, Genitourinary, Musculoskeletal, HENT, Cardiovascular, Respiratory, Psychiatric, Allergic/Imm, Heme/Lymph   Last edited by Zenovia Jordan, LPN on 5/00/9381  8:29 AM. (History)       ALLERGIES Allergies  Allergen Reactions  . Adhesive [Tape] Dermatitis  . Latex Dermatitis  . Codeine Nausea And Vomiting  . Erythromycin Itching and Swelling  . Amoxicillin Diarrhea  . Azithromycin   . Macrolides And Ketolides   . Other     FOOD ALLERGIES MSG  Green peas Onions cucumber Mustard Peanuts Corn sesame Yellow onions lettuce RED DYE NO PERFUMES  . Peanut-Containing Drug Products   . Potassium Clavulanate [Clavulanic Acid]   . Augmentin [Amoxicillin-Pot Clavulanate] Diarrhea    Per discussion 07/26/2012, pt tolerates Omnicef well.  . Iodinated Diagnostic Agents Itching    Tolerates iodinated contrast with Benadryl 50mg  PO prior to administration.  jkl    PAST MEDICAL HISTORY Past Medical History:  Diagnosis Date  . Angiomyolipoma of left kidney   . Arthritis    left foot  . Bronchitis   . Complication of anesthesia    Patient woke up during bunionectomy and breast cyst removal surgery  .  Diabetes mellitus without complication ([place])   . Environmental and seasonal allergies   . GERD (gastroesophageal reflux disease)   . Glaucoma    Bilateral  . Hyperlipemia   . Hypertension   . Lower back pain   . Mini stroke ([place])   . Multiple food allergies    MSG, green peas, onions, cucumber, mustard, peanuts, corn, sesame, lettuce, red dye, perfumes  . Optic nerve disorder    left eye  . Pneumonia 2014  . PONV (postoperative nausea and vomiting)    Slight nausea  . Prediabetes   . Rheumatoid arthritis ([place])   . Sleep apnea   . Vitamin D deficiency    Past Surgical History:  Procedure Laterality Date  . ABDOMINAL HYSTERECTOMY    . BACK SURGERY     X 4  . BREAST CYST EXCISION Bilateral    20+ years ago  . BUNIONECTOMY Bilateral   . CATARACT EXTRACTION    .  CATARACT EXTRACTION, BILATERAL Bilateral 2019   with lens implant; Dr. Bing Plume. June 2019 Left eye; July 2019 Right eye.  Marland Kitchen COLONOSCOPY    . EYE SURGERY    . FINGER SURGERY Left    Thumb    FAMILY HISTORY Family History  Adopted: Yes  Problem Relation Age of Onset  . Hyperlipidemia Mother   . Hypertension Mother   . Colon cancer Neg Hx   . Stomach cancer Neg Hx   . Esophageal cancer Neg Hx     SOCIAL HISTORY Social History   Tobacco Use  . Smoking status: Never Smoker  . Smokeless tobacco: Never Used  Substance Use Topics  . Alcohol use: Yes    Alcohol/week: 0.0 standard drinks    Comment: once a year  . Drug use: No         OPHTHALMIC EXAM:  Base Eye Exam    Visual Acuity (Snellen - Linear)      Right Left   Dist cc 20/20 20/20   Dist ph cc NI NI   Correction:  Glasses       Tonometry (Tonopen, 9:04 AM)      Right Left   Pressure 14 12       Pupils      Dark Light Shape React APD   Right 4 3 Round Brisk None   Left 4 3 Round Brisk None       Visual Fields (Counting fingers)      Left Right    Full Full       Extraocular Movement      Right Left    Full, Ortho Full, Ortho        Neuro/Psych    Oriented x3:  Yes   Mood/Affect:  Normal       Dilation    Both eyes:  1.0% Mydriacyl, 2.5% Phenylephrine @ 9:04 AM        Slit Lamp and Fundus Exam    External Exam      Right Left   External Normal Normal       Slit Lamp Exam      Right Left   Lids/Lashes dermato dermato   Conjunctiva/Sclera mild melanosis mild melanosis   Cornea arcus; well healed cataract wound arcus; well healed cataract wound   Anterior Chamber deep; 1/2+ cell/pigment deep and clear   Iris round; dilated, No NVI round; dilated, No NVI   Lens PCIOL in good position PCIOL in good position   Vitreous vit syneresis; +PVD vit syneresis; +PVD       Fundus Exam      Right Left   Disc cupped; pink, sharp Pink and Sharp, +cupping   C/D Ratio 0.7 0.7   Macula good FLR; mild RPE mottling; no heme/edema Good foveal reflex, Retinal pigment epithelial mottling, No heme or edema   Vessels attenuated; tortuous Vascular attenuation, Tortuous   Periphery attached, No heme, No RT/RD Attached, No heme, No RT/RD          IMAGING AND PROCEDURES  Imaging and Procedures for @TODAY @  OCT, Retina - OU - Both Eyes       Right Eye Quality was good. Central Foveal Thickness: 278. Progression has no prior data. Findings include normal foveal contour, no IRF, no SRF.   Left Eye Central Foveal Thickness: 281. Progression has no prior data. Findings include normal foveal contour, no IRF, no SRF.   Notes *Images captured and stored on drive  Diagnosis / Impression:  NFP;  no IRF/SRF OU  Clinical management:  See below  Abbreviations: NFP - Normal foveal profile. CME - cystoid macular edema. PED - pigment epithelial detachment. IRF - intraretinal fluid. SRF - subretinal fluid. EZ - ellipsoid zone. ERM - epiretinal membrane. ORA - outer retinal atrophy. ORT - outer retinal tubulation. SRHM - subretinal hyper-reflective material                 ASSESSMENT/PLAN:    ICD-10-CM   1.  Posterior vitreous detachment of both eyes H43.813   2. Diabetes mellitus type 2 without retinopathy ([place]) E11.9   3. Retinal edema H35.81 OCT, Retina - OU - Both Eyes  4. Primary open angle glaucoma (POAG) of both eyes, moderate stage H40.1132     1. PVD / vitreous syneresis OU  Symptomatic floaters OD since cataract surgery in August 2019 -- intermittent  Discussed findings and prognosis  No RT or RD on 360 scleral depressed exam  Reviewed s/s of RT/RD  Strict return precautions for any such RT/RD signs/symptoms  2. Diabetes mellitus, type 2 without retinopathy - The incidence, risk factors for progression, natural history and treatment options for diabetic retinopathy  were discussed with patient.   - The need for close monitoring of blood glucose, blood pressure, and serum lipids, avoiding cigarette or any type of tobacco, and the need for long term follow up was also discussed with patient. - f/u in 1 year, sooner prn  3. No retinal edema on exam or OCT  4. POAG OU - moderate stage - under the expert management of Dr. Bing Plume - IOP 14 and 12 today - on latanoprost qhs OU   Ophthalmic Meds Ordered this visit:  No orders of the defined types were placed in this encounter.      Return in about 3 months (around 09/14/2018) for F/U PVD OU, DFE, OCT.  There are no Patient Instructions on file for this visit.   Explained the diagnoses, plan, and follow up with the patient and they expressed understanding.  Patient expressed understanding of the importance of proper follow up care.   This document serves as a record of services personally performed by Gardiner Sleeper, MD, PhD. It was created on their behalf by Ernest Mallick, OA, an ophthalmic assistant. The creation of this record is the provider's dictation and/or activities during the visit.    Electronically signed by: Ernest Mallick, OA  01.11.2020 1:51 PM    Gardiner Sleeper, M.D., Ph.D. Diseases & Surgery of the Retina and  Vitreous [facility]  I have reviewed the above documentation for accuracy and completeness, and I agree with the above. Gardiner Sleeper, M.D., Ph.D. 06/16/18 1:54 PM     Abbreviations: M myopia (nearsighted); A astigmatism; H hyperopia (farsighted); P presbyopia; Mrx spectacle prescription;  CTL contact lenses; OD right eye; OS left eye; OU both eyes  XT exotropia; ET esotropia; PEK punctate epithelial keratitis; PEE punctate epithelial erosions; DES dry eye syndrome; MGD meibomian gland dysfunction; ATs artificial tears; PFAT's preservative free artificial tears; [place] nuclear sclerotic cataract; PSC posterior subcapsular cataract; ERM epi-retinal membrane; PVD posterior vitreous detachment; RD retinal detachment; DM diabetes mellitus; DR diabetic retinopathy; NPDR non-proliferative diabetic retinopathy; PDR proliferative diabetic retinopathy; CSME clinically significant macular edema; DME diabetic macular edema; dbh dot blot hemorrhages; CWS cotton wool spot; POAG primary open angle glaucoma; C/D cup-to-disc ratio; HVF humphrey visual field; GVF goldmann visual field; OCT optical coherence tomography; IOP intraocular pressure; BRVO Branch retinal  vein occlusion; CRVO central retinal vein occlusion; CRAO central retinal artery occlusion; BRAO branch retinal artery occlusion; RT retinal tear; SB scleral buckle; PPV pars plana vitrectomy; VH Vitreous hemorrhage; PRP panretinal laser photocoagulation; IVK intravitreal kenalog; VMT vitreomacular traction; MH Macular hole;  NVD neovascularization of the disc; NVE neovascularization elsewhere; AREDS age related eye disease study; ARMD age related macular degeneration; POAG primary open angle glaucoma; EBMD epithelial/anterior basement membrane dystrophy; ACIOL anterior chamber intraocular lens; IOL intraocular lens; PCIOL posterior chamber intraocular lens; Phaco/IOL phacoemulsification with intraocular lens placement; [place] photorefractive  keratectomy; LASIK laser assisted in situ keratomileusis; HTN hypertension; DM diabetes mellitus; COPD chronic obstructive pulmonary disease

## 2018-06-15 ENCOUNTER — Encounter (INDEPENDENT_AMBULATORY_CARE_PROVIDER_SITE_OTHER): Payer: Self-pay | Admitting: Ophthalmology

## 2018-06-15 ENCOUNTER — Ambulatory Visit (INDEPENDENT_AMBULATORY_CARE_PROVIDER_SITE_OTHER): Payer: 59 | Admitting: Ophthalmology

## 2018-06-15 DIAGNOSIS — H43813 Vitreous degeneration, bilateral: Secondary | ICD-10-CM

## 2018-06-15 DIAGNOSIS — H3581 Retinal edema: Secondary | ICD-10-CM | POA: Diagnosis not present

## 2018-06-15 DIAGNOSIS — H401132 Primary open-angle glaucoma, bilateral, moderate stage: Secondary | ICD-10-CM | POA: Diagnosis not present

## 2018-06-15 DIAGNOSIS — E119 Type 2 diabetes mellitus without complications: Secondary | ICD-10-CM

## 2018-06-16 ENCOUNTER — Ambulatory Visit (INDEPENDENT_AMBULATORY_CARE_PROVIDER_SITE_OTHER): Payer: 59 | Admitting: Physician Assistant

## 2018-06-16 ENCOUNTER — Encounter (INDEPENDENT_AMBULATORY_CARE_PROVIDER_SITE_OTHER): Payer: Self-pay | Admitting: Physician Assistant

## 2018-06-16 ENCOUNTER — Encounter (INDEPENDENT_AMBULATORY_CARE_PROVIDER_SITE_OTHER): Payer: Self-pay | Admitting: Ophthalmology

## 2018-06-16 VITALS — BP 119/57 | HR 79 | Temp 98.3°F | Ht 65.0 in | Wt 192.0 lb

## 2018-06-16 DIAGNOSIS — Z9189 Other specified personal risk factors, not elsewhere classified: Secondary | ICD-10-CM | POA: Diagnosis not present

## 2018-06-16 DIAGNOSIS — E559 Vitamin D deficiency, unspecified: Secondary | ICD-10-CM

## 2018-06-16 DIAGNOSIS — E669 Obesity, unspecified: Secondary | ICD-10-CM | POA: Diagnosis not present

## 2018-06-16 DIAGNOSIS — E119 Type 2 diabetes mellitus without complications: Secondary | ICD-10-CM | POA: Diagnosis not present

## 2018-06-16 DIAGNOSIS — Z6832 Body mass index (BMI) 32.0-32.9, adult: Secondary | ICD-10-CM | POA: Diagnosis not present

## 2018-06-16 MED ORDER — VITAMIN D (ERGOCALCIFEROL) 1.25 MG (50000 UNIT) PO CAPS: 50000.0000 [IU] | ORAL_CAPSULE | ORAL | 0 refills | Status: DC

## 2018-06-16 NOTE — Progress Notes (Signed)
Office: [phone]  /  Fax: [phone]   HPI:   Chief Complaint: OBESITY [name] [name] is here to discuss her progress with her obesity treatment plan. She is on the Category 2 plan and is following her eating plan approximately 90 % of the time. She states she is walking, using the elliptical and weights 60 minutes 5 times per week. [name] [name] did very well with weight loss. She reports eating dinner for lunch and then having lunch portions for dinner.  Her weight is 192 lb (87.1 kg) today and has had a weight loss of 3 pounds over a period of 4 weeks since her last visit. She has lost 7 lbs since starting treatment with [place].  Vitamin D deficiency [name] [name] has a diagnosis of vitamin D deficiency. She is currently taking vit D and denies nausea, vomiting, or muscle weakness.  At risk for osteopenia and osteoporosis [name] [name] is at higher risk of osteopenia and osteoporosis due to vitamin D deficiency.   Diabetes II [name] [name] has a diagnosis of diabetes type II. [name] [name] is on the medication Janumet and sees endocrinology once yearly. Her last A1c was 5.9 on 04/06/18. She has been working on intensive lifestyle modifications including diet, exercise, and weight loss to help control her blood glucose levels. She denies nausea, vomiting, and diarrhea.  ASSESSMENT AND PLAN:  Vitamin D deficiency - Plan: Vitamin D, Ergocalciferol, (DRISDOL) 1.25 MG (50000 UT) CAPS capsule  Type 2 diabetes mellitus without complication, without long-term current use of insulin (HCC)  At risk for osteoporosis  Class 1 obesity with serious comorbidity and body mass index (BMI) of 32.0 to 32.9 in adult, unspecified obesity type  PLAN:  Vitamin D Deficiency [name] [name] was informed that low vitamin D levels contributes to fatigue and are associated with obesity, breast, and colon cancer. She agrees to continue to take prescription Vit D @50 ,000 IU every week #4 with no refills and will follow up for routine testing of vitamin D,  at least 2-3 times per year. She was informed of the risk of over-replacement of vitamin D and agrees to not increase her dose unless she discusses this with [place] first. [name] [name] agrees to follow up in 2 weeks.  At risk for osteopenia and osteoporosis [name] [name] was given extended (15 minutes) osteoporosis prevention counseling today. [name] [name] is at risk for osteopenia and osteoporosis due to her vitamin D deficiency. She was encouraged to take her vitamin D and follow her higher calcium diet and increase strengthening exercise to help strengthen her bones and decrease her risk of osteopenia and osteoporosis.  Diabetes II [name] [name] has been given extensive diabetes education by myself today including ideal fasting and post-prandial blood glucose readings, individual ideal Hgb A1c goals, and hypoglycemia prevention. We discussed the importance of good blood sugar control to decrease the likelihood of diabetic complications such as nephropathy, neuropathy, limb loss, blindness, coronary artery disease, and death. We discussed the importance of intensive lifestyle modification including diet, exercise and weight loss as the first line treatment for diabetes. [name] [name] agrees to continue her diabetes medications and weight loss. She will follow up at the agreed upon time.  Obesity [name] [name] is currently in the action stage of change. As such, her goal is to continue with weight loss efforts. She has agreed to follow the Category 2 plan. [name] [name] has been instructed to work up to a goal of 150 minutes of combined cardio and strengthening exercise per week for weight loss and overall health benefits. We discussed the following Behavioral  Modification Strategies today: work on meal planning and easy cooking plans and planning for success.  [name] [name] has agreed to follow up with our clinic in 2 weeks. She was informed of the importance of frequent follow up visits to maximize her success with intensive lifestyle modifications for  her multiple health conditions.  ALLERGIES: Allergies  Allergen Reactions  . Adhesive [Tape] Dermatitis  . Latex Dermatitis  . Codeine Nausea And Vomiting  . Erythromycin Itching and Swelling  . Amoxicillin Diarrhea  . Azithromycin   . Macrolides And Ketolides   . Other     FOOD ALLERGIES MSG  Green peas Onions cucumber Mustard Peanuts Corn sesame Yellow onions lettuce RED DYE NO PERFUMES  . Peanut-Containing Drug Products   . Potassium Clavulanate [Clavulanic Acid]   . Augmentin [Amoxicillin-Pot Clavulanate] Diarrhea    Per discussion 07/26/2012, pt tolerates Omnicef well.  . Iodinated Diagnostic Agents Itching    Tolerates iodinated contrast with Benadryl 50mg  PO prior to administration.  jkl    MEDICATIONS: Current Outpatient Medications on File Prior to Visit  Medication Sig Dispense Refill  . Amlodipine-Valsartan-HCTZ 5-160-12.5 MG TABS Take 1 tablet by mouth at bedtime.    Marland Kitchen aspirin EC 325 MG tablet Take 1 tablet (325 mg total) by mouth daily. 30 tablet 0  . atorvastatin (LIPITOR) 40 MG tablet Take 40 mg by mouth daily.    . carisoprodol (SOMA) 350 MG tablet Take 350 mg by mouth 4 (four) times daily as needed for muscle spasms.    . cyclobenzaprine (FLEXERIL) 5 MG tablet Take 5 mg by mouth 3 (three) times daily as needed for muscle spasms.    . diphenhydrAMINE (BENADRYL) 25 mg capsule Take 25 mg by mouth at bedtime as needed.    Marland Kitchen EPINEPHRINE, ANAPHYLAXIS THERAPY AGENTS, Inject 0.3 mg into the muscle.    . estradiol (ESTRACE) 1 MG tablet Take 1 mg by mouth daily.    . fenofibrate (TRICOR) 145 MG tablet Take 145 mg by mouth daily.    . fluticasone (FLONASE) 50 MCG/ACT nasal spray Place 1 spray into both nostrils daily.    Marland Kitchen gabapentin (NEURONTIN) 300 MG capsule Take 300 mg by mouth at bedtime.    Marland Kitchen ibuprofen (ADVIL,MOTRIN) 800 MG tablet Take 800 mg by mouth every 8 (eight) hours as needed for moderate pain.    Marland Kitchen latanoprost (XALATAN) 0.005 % ophthalmic solution  Place 1 drop into both eyes at bedtime.    . montelukast (SINGULAIR) 10 MG tablet Take 10 mg by mouth at bedtime.    . potassium chloride SA (K-DUR,KLOR-CON) 20 MEQ tablet Take 20 mEq by mouth daily.    . predniSONE (DELTASONE) 50 MG tablet Take 1 tab 13 hours before ct scan, then 1 tab at 7 hours prior to ct scan, then 1 tab 1 hour prior to ct scan. 3 tablet 0  . psyllium (METAMUCIL) 58.6 % packet Take 1 packet by mouth daily.    . SitaGLIPtin-MetFORMIN HCl [id number] MG TB24 Take 1 tablet by mouth at bedtime.    . traMADol (ULTRAM) 50 MG tablet Take 50 mg by mouth every 6 (six) hours as needed for pain.     No current facility-administered medications on file prior to visit.     PAST MEDICAL HISTORY: Past Medical History:  Diagnosis Date  . Angiomyolipoma of left kidney   . Arthritis    left foot  . Bronchitis   . Complication of anesthesia    Patient woke up during bunionectomy and breast  cyst removal surgery  . Diabetes mellitus without complication ([place])   . Environmental and seasonal allergies   . GERD (gastroesophageal reflux disease)   . Glaucoma    Bilateral  . Hyperlipemia   . Hypertension   . Lower back pain   . Mini stroke ([place])   . Multiple food allergies    MSG, green peas, onions, cucumber, mustard, peanuts, corn, sesame, lettuce, red dye, perfumes  . Optic nerve disorder    left eye  . Pneumonia 2014  . PONV (postoperative nausea and vomiting)    Slight nausea  . Prediabetes   . Rheumatoid arthritis ([place])   . Sleep apnea   . Vitamin D deficiency     PAST SURGICAL HISTORY: Past Surgical History:  Procedure Laterality Date  . ABDOMINAL HYSTERECTOMY    . BACK SURGERY     X 4  . BREAST CYST EXCISION Bilateral    20+ years ago  . BUNIONECTOMY Bilateral   . CATARACT EXTRACTION    . CATARACT EXTRACTION, BILATERAL Bilateral 2019   with lens implant; Dr. Bing Plume. June 2019 Left eye; July 2019 Right eye.  Marland Kitchen COLONOSCOPY    . EYE SURGERY    . FINGER SURGERY  Left    Thumb    SOCIAL HISTORY: Social History   Tobacco Use  . Smoking status: Never Smoker  . Smokeless tobacco: Never Used  Substance Use Topics  . Alcohol use: Yes    Alcohol/week: 0.0 standard drinks    Comment: once a year  . Drug use: No    FAMILY HISTORY: Family History  Adopted: Yes  Problem Relation Age of Onset  . Hyperlipidemia Mother   . Hypertension Mother   . Colon cancer Neg Hx   . Stomach cancer Neg Hx   . Esophageal cancer Neg Hx     ROS: Review of Systems  Gastrointestinal: Negative for diarrhea, nausea and vomiting.  Musculoskeletal:       Negative for muscle weakness.    PHYSICAL EXAM: Blood pressure (!) 119/57, pulse 79, temperature 98.3 F (36.8 C), temperature source Oral, height 5\' 5"  (1.651 m), weight 192 lb (87.1 kg), SpO2 99 %. Body mass index is 31.95 kg/m. Physical Exam Vitals signs reviewed.  Constitutional:      Appearance: Normal appearance. She is obese.  Cardiovascular:     Rate and Rhythm: Normal rate.  Pulmonary:     Effort: Pulmonary effort is normal.  Musculoskeletal: Normal range of motion.  Skin:    General: Skin is warm and dry.  Neurological:     Mental Status: She is alert and oriented to person, place, and time.  Psychiatric:        Mood and Affect: Mood normal.        Behavior: Behavior normal.     RECENT LABS AND TESTS: BMET    Component Value Date/Time   NA 140 04/06/2018 1028   K 4.1 04/06/2018 1028   CL 102 04/06/2018 1028   CO2 22 04/06/2018 1028   GLUCOSE 88 04/06/2018 1028   GLUCOSE 96 12/09/2017 1020   BUN 16 04/06/2018 1028   CREATININE 0.95 04/06/2018 1028   CALCIUM 10.4 (H) 04/06/2018 1028   GFRNONAA 64 04/06/2018 1028   GFRAA 74 04/06/2018 1028   Lab Results  Component Value Date   HGBA1C 5.9 (H) 04/06/2018   HGBA1C 6.0 (H) 02/09/2015   Lab Results  Component Value Date   INSULIN 18.1 04/06/2018   CBC    Component Value  Date/Time   WBC 9.0 04/06/2018 1028   WBC 9.2  12/09/2017 1020   RBC 4.09 04/06/2018 1028   RBC 3.97 12/09/2017 1020   HGB 12.5 04/06/2018 1028   HCT 39.0 04/06/2018 1028   PLT 390.0 12/09/2017 1020   MCV 95 04/06/2018 1028   MCH 30.6 04/06/2018 1028   MCH 32.0 02/09/2015 1103   MCHC 32.1 04/06/2018 1028   MCHC 33.4 12/09/2017 1020   RDW 12.9 04/06/2018 1028   LYMPHSABS 4.4 (H) 04/06/2018 1028   MONOABS 0.7 12/09/2017 1020   EOSABS 0.2 04/06/2018 1028   BASOSABS 0.1 04/06/2018 1028   Iron/TIBC/Ferritin/ %Sat No results found for: IRON, TIBC, FERRITIN, IRONPCTSAT Lipid Panel     Component Value Date/Time   CHOL 152 04/06/2018 1028   TRIG 144 04/06/2018 1028   HDL 42 04/06/2018 1028   LDLCALC 81 04/06/2018 1028   Hepatic Function Panel     Component Value Date/Time   PROT 7.9 04/06/2018 1028   ALBUMIN 4.4 04/06/2018 1028   AST 16 04/06/2018 1028   ALT 17 04/06/2018 1028   ALKPHOS 33 (L) 04/06/2018 1028   BILITOT 0.3 04/06/2018 1028   BILIDIR 0.1 10/02/2006 1620      Component Value Date/Time   TSH 1.820 04/06/2018 1028   TSH 0.62 10/02/2006 1620   Results for ARPITA, FENTRESS (MRN [id number]) as of 06/16/2018 12:51  Ref. Range 04/06/2018 10:28  Vitamin D, 25-Hydroxy Latest Ref Range: 30.0 - 100.0 ng/mL 30.6    OBESITY BEHAVIORAL INTERVENTION VISIT  Today's visit was # 5   Starting weight: 199 lbs Starting date: 04/06/18 Today's weight : Weight: 192 lb (87.1 kg)  Today's date: 06/16/2018 Total lbs lost to date: 7  ASK: We discussed the diagnosis of obesity with Araceli Bouche today and Jamela agreed to give [place] permission to discuss obesity behavioral modification therapy today.  ASSESS: Staria has the diagnosis of obesity and her BMI today is 31.9. Halea is in the action stage of change.   ADVISE: Oksana was educated on the multiple health risks of obesity as well as the benefit of weight loss to improve her health. She was advised of the need for long term treatment and the importance of lifestyle  modifications to improve her current health and to decrease her risk of future health problems.  AGREE: Multiple dietary modification options and treatment options were discussed and Rayneisha agreed to follow the recommendations documented in the above note.  ARRANGE: Antanette was educated on the importance of frequent visits to treat obesity as outlined per CMS and USPSTF guidelines and agreed to schedule her next follow up appointment today.  Lenward Chancellor, am acting as transcriptionist for Abby Potash, PA-C I, Abby Potash, PA-C have reviewed above note and agree with its content

## 2018-06-24 ENCOUNTER — Ambulatory Visit
Admission: RE | Admit: 2018-06-24 | Discharge: 2018-06-24 | Disposition: A | Discharge status: Home / Self Care | Payer: 59 | Source: Ambulatory Visit | Attending: Obstetrics and Gynecology | Admitting: Obstetrics and Gynecology

## 2018-06-24 DIAGNOSIS — Z1231 Encounter for screening mammogram for malignant neoplasm of breast: Secondary | ICD-10-CM

## 2018-07-01 ENCOUNTER — Encounter (INDEPENDENT_AMBULATORY_CARE_PROVIDER_SITE_OTHER): Payer: Self-pay | Admitting: Family Medicine

## 2018-07-01 ENCOUNTER — Ambulatory Visit (INDEPENDENT_AMBULATORY_CARE_PROVIDER_SITE_OTHER): Payer: 59 | Admitting: Family Medicine

## 2018-07-01 VITALS — BP 107/68 | HR 68 | Temp 98.1°F | Ht 65.0 in | Wt 192.0 lb

## 2018-07-01 DIAGNOSIS — E119 Type 2 diabetes mellitus without complications: Secondary | ICD-10-CM | POA: Diagnosis not present

## 2018-07-01 DIAGNOSIS — E669 Obesity, unspecified: Secondary | ICD-10-CM | POA: Diagnosis not present

## 2018-07-01 DIAGNOSIS — Z6831 Body mass index (BMI) 31.0-31.9, adult: Secondary | ICD-10-CM

## 2018-07-03 NOTE — Progress Notes (Signed)
Office: [phone]  /  Fax: [phone]   HPI:   Chief Complaint: OBESITY [name] [name] is here to discuss her progress with her obesity treatment plan. She is on the Category 2 plan and is following her eating plan approximately 90% of the time. She states she is on the elliptical and bike for 30 minutes 3 times per week. Olita has a shake in the morning and is not counting the calories in the shake. She is not keeping track of snack calories.  Her weight is 192 lb (87.1 kg) today and has not lost weight since her last visit. She has lost 7 lbs since starting treatment with [place].  Diabetes II Cheral has a diagnosis of diabetes type II. Selene's diabetes is well controlled on Sitagliptin-metformin. Last A1c was 5.9 on 04/06/18. She states she does not check BGs at home. She denies polyphagia or hypoglycemia. She has been working on intensive lifestyle modifications including diet, exercise, and weight loss to help control her blood glucose levels.  ALLERGIES: Allergies  Allergen Reactions  . Adhesive [Tape] Dermatitis  . Latex Dermatitis  . Codeine Nausea And Vomiting  . Erythromycin Itching and Swelling  . Amoxicillin Diarrhea  . Azithromycin   . Macrolides And Ketolides   . Other     FOOD ALLERGIES MSG  Green peas Onions cucumber Mustard Peanuts Corn sesame Yellow onions lettuce RED DYE NO PERFUMES  . Peanut-Containing Drug Products   . Potassium Clavulanate [Clavulanic Acid]   . Augmentin [Amoxicillin-Pot Clavulanate] Diarrhea    Per discussion 07/26/2012, pt tolerates Omnicef well.  . Iodinated Diagnostic Agents Itching    Tolerates iodinated contrast with Benadryl 50mg  PO prior to administration.  jkl    MEDICATIONS: Current Outpatient Medications on File Prior to Visit  Medication Sig Dispense Refill  . Amlodipine-Valsartan-HCTZ 5-160-12.5 MG TABS Take 1 tablet by mouth at bedtime.    Marland Kitchen aspirin EC 325 MG tablet Take 1 tablet (325 mg total) by mouth daily. 30 tablet  0  . atorvastatin (LIPITOR) 40 MG tablet Take 40 mg by mouth daily.    . carisoprodol (SOMA) 350 MG tablet Take 350 mg by mouth 4 (four) times daily as needed for muscle spasms.    . cyclobenzaprine (FLEXERIL) 5 MG tablet Take 5 mg by mouth 3 (three) times daily as needed for muscle spasms.    . diphenhydrAMINE (BENADRYL) 25 mg capsule Take 25 mg by mouth at bedtime as needed.    Marland Kitchen EPINEPHRINE, ANAPHYLAXIS THERAPY AGENTS, Inject 0.3 mg into the muscle.    . estradiol (ESTRACE) 1 MG tablet Take 1 mg by mouth daily.    . fenofibrate (TRICOR) 145 MG tablet Take 145 mg by mouth daily.    . fluticasone (FLONASE) 50 MCG/ACT nasal spray Place 1 spray into both nostrils daily.    Marland Kitchen gabapentin (NEURONTIN) 300 MG capsule Take 300 mg by mouth at bedtime.    Marland Kitchen ibuprofen (ADVIL,MOTRIN) 800 MG tablet Take 800 mg by mouth every 8 (eight) hours as needed for moderate pain.    Marland Kitchen latanoprost (XALATAN) 0.005 % ophthalmic solution Place 1 drop into both eyes at bedtime.    . montelukast (SINGULAIR) 10 MG tablet Take 10 mg by mouth at bedtime.    . potassium chloride SA (K-DUR,KLOR-CON) 20 MEQ tablet Take 20 mEq by mouth daily.    . predniSONE (DELTASONE) 50 MG tablet Take 1 tab 13 hours before ct scan, then 1 tab at 7 hours prior to ct scan, then 1 tab  1 hour prior to ct scan. 3 tablet 0  . psyllium (METAMUCIL) 58.6 % packet Take 1 packet by mouth daily.    . SitaGLIPtin-MetFORMIN HCl [phone] MG TB24 Take 1 tablet by mouth at bedtime.    . traMADol (ULTRAM) 50 MG tablet Take 50 mg by mouth every 6 (six) hours as needed for pain.    . Vitamin D, Ergocalciferol, (DRISDOL) 1.25 MG (50000 UT) CAPS capsule Take 1 capsule (50,000 Units total) by mouth every 7 (seven) days. 4 capsule 0   No current facility-administered medications on file prior to visit.     PAST MEDICAL HISTORY: Past Medical History:  Diagnosis Date  . Angiomyolipoma of left kidney   . Arthritis    left foot  . Bronchitis   . Complication of  anesthesia    Patient woke up during bunionectomy and breast cyst removal surgery  . Diabetes mellitus without complication ([place])   . Environmental and seasonal allergies   . GERD (gastroesophageal reflux disease)   . Glaucoma    Bilateral  . Hyperlipemia   . Hypertension   . Lower back pain   . Mini stroke ([place])   . Multiple food allergies    MSG, green peas, onions, cucumber, mustard, peanuts, corn, sesame, lettuce, red dye, perfumes  . Optic nerve disorder    left eye  . Pneumonia 2014  . PONV (postoperative nausea and vomiting)    Slight nausea  . Prediabetes   . Rheumatoid arthritis ([place])   . Sleep apnea   . Vitamin D deficiency     PAST SURGICAL HISTORY: Past Surgical History:  Procedure Laterality Date  . ABDOMINAL HYSTERECTOMY    . BACK SURGERY     X 4  . BREAST CYST EXCISION Bilateral    20+ years ago  . BUNIONECTOMY Bilateral   . CATARACT EXTRACTION    . CATARACT EXTRACTION, BILATERAL Bilateral 2019   with lens implant; Dr. Bing Plume. June 2019 Left eye; July 2019 Right eye.  Marland Kitchen COLONOSCOPY    . EYE SURGERY    . FINGER SURGERY Left    Thumb    SOCIAL HISTORY: Social History   Tobacco Use  . Smoking status: Never Smoker  . Smokeless tobacco: Never Used  Substance Use Topics  . Alcohol use: Yes    Alcohol/week: 0.0 standard drinks    Comment: once a year  . Drug use: No    FAMILY HISTORY: Family History  Adopted: Yes  Problem Relation Age of Onset  . Hyperlipidemia Mother   . Hypertension Mother   . Colon cancer Neg Hx   . Stomach cancer Neg Hx   . Esophageal cancer Neg Hx     ROS: Review of Systems  Constitutional: Negative for weight loss.  Endo/Heme/Allergies:       Negative polyphagia Negative hypoglycemia    PHYSICAL EXAM: Blood pressure 107/68, pulse 68, temperature 98.1 F (36.7 C), temperature source Oral, height 5\' 5"  (1.651 m), weight 192 lb (87.1 kg), SpO2 99 %. Body mass index is 31.95 kg/m. Physical Exam Vitals signs  reviewed.  Constitutional:      Appearance: Normal appearance. She is obese.  Cardiovascular:     Rate and Rhythm: Normal rate.     Pulses: Normal pulses.  Pulmonary:     Effort: Pulmonary effort is normal.     Breath sounds: Normal breath sounds.  Musculoskeletal: Normal range of motion.  Skin:    General: Skin is warm and dry.  Neurological:  Mental Status: She is alert and oriented to person, place, and time.  Psychiatric:        Mood and Affect: Mood normal.        Behavior: Behavior normal.     RECENT LABS AND TESTS: BMET    Component Value Date/Time   NA 140 04/06/2018 1028   K 4.1 04/06/2018 1028   CL 102 04/06/2018 1028   CO2 22 04/06/2018 1028   GLUCOSE 88 04/06/2018 1028   GLUCOSE 96 12/09/2017 1020   BUN 16 04/06/2018 1028   CREATININE 0.95 04/06/2018 1028   CALCIUM 10.4 (H) 04/06/2018 1028   GFRNONAA 64 04/06/2018 1028   GFRAA 74 04/06/2018 1028   Lab Results  Component Value Date   HGBA1C 5.9 (H) 04/06/2018   HGBA1C 6.0 (H) 02/09/2015   Lab Results  Component Value Date   INSULIN 18.1 04/06/2018   CBC    Component Value Date/Time   WBC 9.0 04/06/2018 1028   WBC 9.2 12/09/2017 1020   RBC 4.09 04/06/2018 1028   RBC 3.97 12/09/2017 1020   HGB 12.5 04/06/2018 1028   HCT 39.0 04/06/2018 1028   PLT 390.0 12/09/2017 1020   MCV 95 04/06/2018 1028   MCH 30.6 04/06/2018 1028   MCH 32.0 02/09/2015 1103   MCHC 32.1 04/06/2018 1028   MCHC 33.4 12/09/2017 1020   RDW 12.9 04/06/2018 1028   LYMPHSABS 4.4 (H) 04/06/2018 1028   MONOABS 0.7 12/09/2017 1020   EOSABS 0.2 04/06/2018 1028   BASOSABS 0.1 04/06/2018 1028   Iron/TIBC/Ferritin/ %Sat No results found for: IRON, TIBC, FERRITIN, IRONPCTSAT Lipid Panel     Component Value Date/Time   CHOL 152 04/06/2018 1028   TRIG 144 04/06/2018 1028   HDL 42 04/06/2018 1028   LDLCALC 81 04/06/2018 1028   Hepatic Function Panel     Component Value Date/Time   PROT 7.9 04/06/2018 1028   ALBUMIN 4.4  04/06/2018 1028   AST 16 04/06/2018 1028   ALT 17 04/06/2018 1028   ALKPHOS 33 (L) 04/06/2018 1028   BILITOT 0.3 04/06/2018 1028   BILIDIR 0.1 10/02/2006 1620      Component Value Date/Time   TSH 1.820 04/06/2018 1028   TSH 0.62 10/02/2006 1620    ASSESSMENT AND PLAN: Type 2 diabetes mellitus without complication, without long-term current use of insulin (HCC)  Class 1 obesity with serious comorbidity and body mass index (BMI) of 31.0 to 31.9 in adult, unspecified obesity type  PLAN:  Diabetes II Reilyn has been given extensive diabetes education by myself today including ideal fasting and post-prandial blood glucose readings, individual ideal Hgb A1c goals and hypoglycemia prevention. We discussed the importance of good blood sugar control to decrease the likelihood of diabetic complications such as nephropathy, neuropathy, limb loss, blindness, coronary artery disease, and death. We discussed the importance of intensive lifestyle modification including diet, exercise and weight loss as the first line treatment for diabetes. See agrees to continue her diabetes medications and we will recheck A1c at next visit. Sherae agrees to follow up with our clinic in 2 weeks.  I spent > than 50% of the 15 minute visit on counseling as documented in the note.  Obesity Mariela is currently in the action stage of change. As such, her goal is to continue with weight loss efforts She has agreed to follow the Category 2 plan Lashina will continue current exercise regimen for weight loss and overall health benefits. We discussed the following Behavioral Modification Strategies today:  increase H20 intake and planning for success Remell is to keep up with snack calories. She will add up calories in her morning shake.  Ben has agreed to follow up with our clinic in 2 weeks. She was informed of the importance of frequent follow up visits to maximize her success with intensive lifestyle modifications  for her multiple health conditions.   OBESITY BEHAVIORAL INTERVENTION VISIT  Today's visit was # 6  Starting weight: 199 lbs Starting date: 04/06/18 Today's weight : 192 lbs  Today's date: 07/01/2018 Total lbs lost to date: 7    ASK: We discussed the diagnosis of obesity with Araceli Bouche today and Kamariya agreed to give [place] permission to discuss obesity behavioral modification therapy today.  ASSESS: Georjean has the diagnosis of obesity and her BMI today is 31.95 Kataleyah is in the action stage of change   ADVISE: Tori was educated on the multiple health risks of obesity as well as the benefit of weight loss to improve her health. She was advised of the need for long term treatment and the importance of lifestyle modifications to improve her current health and to decrease her risk of future health problems.  AGREE: Multiple dietary modification options and treatment options were discussed and  Deloria agreed to follow the recommendations documented in the above note.  ARRANGE: Edia was educated on the importance of frequent visits to treat obesity as outlined per CMS and USPSTF guidelines and agreed to schedule her next follow up appointment today.  Wilhemena Durie, am acting as Location manager for [employer], FNP-C.  I have reviewed the above documentation for accuracy and completeness, and I agree with the above.  - Javante Nilsson, FNP-C.

## 2018-07-05 ENCOUNTER — Encounter (INDEPENDENT_AMBULATORY_CARE_PROVIDER_SITE_OTHER): Payer: Self-pay | Admitting: Family Medicine

## 2018-07-05 DIAGNOSIS — E669 Obesity, unspecified: Secondary | ICD-10-CM | POA: Insufficient documentation

## 2018-07-05 DIAGNOSIS — Z6831 Body mass index (BMI) 31.0-31.9, adult: Secondary | ICD-10-CM

## 2018-07-07 ENCOUNTER — Other Ambulatory Visit (INDEPENDENT_AMBULATORY_CARE_PROVIDER_SITE_OTHER): Payer: Self-pay | Admitting: Physician Assistant

## 2018-07-07 DIAGNOSIS — E559 Vitamin D deficiency, unspecified: Secondary | ICD-10-CM

## 2018-07-21 ENCOUNTER — Encounter (INDEPENDENT_AMBULATORY_CARE_PROVIDER_SITE_OTHER): Payer: Self-pay | Admitting: Family Medicine

## 2018-07-21 ENCOUNTER — Ambulatory Visit (INDEPENDENT_AMBULATORY_CARE_PROVIDER_SITE_OTHER): Payer: 59 | Admitting: Family Medicine

## 2018-07-21 VITALS — BP 110/55 | HR 73 | Temp 97.8°F | Ht 65.0 in | Wt 188.0 lb

## 2018-07-21 DIAGNOSIS — E7849 Other hyperlipidemia: Secondary | ICD-10-CM | POA: Insufficient documentation

## 2018-07-21 DIAGNOSIS — E559 Vitamin D deficiency, unspecified: Secondary | ICD-10-CM

## 2018-07-21 DIAGNOSIS — Z9189 Other specified personal risk factors, not elsewhere classified: Secondary | ICD-10-CM

## 2018-07-21 DIAGNOSIS — E119 Type 2 diabetes mellitus without complications: Secondary | ICD-10-CM

## 2018-07-21 DIAGNOSIS — E669 Obesity, unspecified: Secondary | ICD-10-CM | POA: Diagnosis not present

## 2018-07-21 DIAGNOSIS — Z6831 Body mass index (BMI) 31.0-31.9, adult: Secondary | ICD-10-CM | POA: Diagnosis not present

## 2018-07-21 MED ORDER — VITAMIN D (ERGOCALCIFEROL) 1.25 MG (50000 UNIT) PO CAPS: 50000.0000 [IU] | ORAL_CAPSULE | ORAL | 0 refills | Status: DC

## 2018-07-21 NOTE — Progress Notes (Signed)
Office: [phone]  /  Fax: [phone]   HPI:   Chief Complaint: OBESITY [name] [name] is here to discuss her progress with her obesity treatment plan. She is on the Category 2 plan and is following her eating plan approximately 90 % of the time. She states she is using the elliptical and bike for 30 minutes 3 times per week. Eriana has done well on her plan. She is eating all of her protein, but is struggling with eating eggs in the mornings. However, she wants to work on learning to eat eggs.  Her weight is 188 lb (85.3 kg) today and has had a weight loss of 4 pounds over a period of 3 weeks since her last visit. She has lost 11 lbs since starting treatment with [place].  Vitamin D deficiency [name] [name] has a diagnosis of vitamin D deficiency. She is currently taking vit D. Her last level was 30.6 on 04/06/18 and is not at goal. She denies nausea, vomiting, or muscle weakness.  At risk for osteopenia and osteoporosis [name] [name] is at higher risk of osteopenia and osteoporosis due to vitamin D deficiency.   Hyperlipidemia [name] [name] has hyperlipidemia and has been trying to improve her cholesterol levels with intensive lifestyle modification including a low saturated fat diet, exercise and weight loss. Her last FLP was within normal limits. She is on fenofibrate and atorvastatin. She denies any chest pain or shortness of breath.  Diabetes II [name] [name] has a diagnosis of diabetes type II. She is on sitagliptin-metformin 100-1000mg  and is well controlled. Her last A1c was 5.9 on 04/06/18. [name] [name] states that she is not checking blood sugars. She denies any hypoglycemic episodes. She has been working on intensive lifestyle modifications including diet, exercise, and weight loss to help control her blood glucose levels.  ASSESSMENT AND PLAN:  Vitamin D deficiency - Plan: VITAMIN D 25 Hydroxy (Vit-D Deficiency, Fractures), Vitamin D, Ergocalciferol, (DRISDOL) 1.25 MG (50000 UT) CAPS capsule  Other hyperlipidemia -  Plan: Lipid Panel With LDL/HDL Ratio  Type 2 diabetes mellitus without complication, without long-term current use of insulin (HCC) - Plan: Hemoglobin A1c, Insulin, random, Comprehensive metabolic panel  At risk for osteoporosis  Class 1 obesity with serious comorbidity and body mass index (BMI) of 31.0 to 31.9 in adult, unspecified obesity type  PLAN:  Diabetes II [name] [name] has been given extensive diabetes education by myself today including ideal fasting and post-prandial blood glucose readings, individual ideal Hgb A1c goals  and hypoglycemia prevention. We discussed the importance of good blood sugar control to decrease the likelihood of diabetic complications such as nephropathy, neuropathy, limb loss, blindness, coronary artery disease, and death. We discussed the importance of intensive lifestyle modification including diet, exercise and weight loss as the first line treatment for diabetes. We will check labs today.  [name] [name] agrees to continue her diabetes medications and will follow up at the agreed upon time.  Vitamin D Deficiency [name] [name] was informed that low vitamin D levels contributes to fatigue and are associated with obesity, breast, and colon cancer. [name] [name] agrees to continue to take prescription Vit D @50 ,000 IU every week #4 with no refills and will follow up for routine testing of vitamin D, at least 2-3 times per year. She was informed of the risk of over-replacement of vitamin D and agrees to not increase her dose unless she discusses this with [place] first. We will check level today.[name] [name] agrees to follow up in 2 weeks as directed.  At risk for osteopenia and osteoporosis [name] [name] was  given extended (15 minutes) osteoporosis prevention counseling today. [name] [name] is at risk for osteopenia and osteoporosis due to her vitamin D deficiency. She was encouraged to take her vitamin D and follow her higher calcium diet and increase strengthening exercise to help strengthen her bones and decrease  her risk of osteopenia and osteoporosis.  Hyperlipidemia [name] [name] was informed of the American Heart Association Guidelines emphasizing intensive lifestyle modifications as the first line treatment for hyperlipidemia. We discussed many lifestyle modifications today in depth, and [name] [name] will continue to work on decreasing saturated fats such as fatty red meat, butter and many fried foods. She will also increase vegetables and lean protein in her diet and continue to work on exercise and weight loss efforts. We will check her FLP today and she agrees to follow up as directed,  Obesity Charmane is currently in the action stage of change. As such, her goal is to continue with weight loss efforts. She has agreed to follow the Category 2 plan. [name] [name] has been instructed to continue using the elliptical and bike 3 times per week. We discussed the following Behavioral Modification Strategies today: increasing lean protein intake and planning for success.  [name] [name] has agreed to follow up with our clinic in 2 weeks. She was informed of the importance of frequent follow up visits to maximize her success with intensive lifestyle modifications for her multiple health conditions.  ALLERGIES: Allergies  Allergen Reactions  . Adhesive [Tape] Dermatitis  . Latex Dermatitis  . Codeine Nausea And Vomiting  . Erythromycin Itching and Swelling  . Amoxicillin Diarrhea  . Azithromycin   . Macrolides And Ketolides   . Other     FOOD ALLERGIES MSG  Green peas Onions cucumber Mustard Peanuts Corn sesame Yellow onions lettuce RED DYE NO PERFUMES  . Peanut-Containing Drug Products   . Potassium Clavulanate [Clavulanic Acid]   . Augmentin [Amoxicillin-Pot Clavulanate] Diarrhea    Per discussion 07/26/2012, pt tolerates Omnicef well.  . Iodinated Diagnostic Agents Itching    Tolerates iodinated contrast with Benadryl 50mg  PO prior to administration.  jkl    MEDICATIONS: Current Outpatient Medications on  File Prior to Visit  Medication Sig Dispense Refill  . Amlodipine-Valsartan-HCTZ 5-160-12.5 MG TABS Take 1 tablet by mouth at bedtime.    Marland Kitchen aspirin EC 325 MG tablet Take 1 tablet (325 mg total) by mouth daily. 30 tablet 0  . atorvastatin (LIPITOR) 40 MG tablet Take 40 mg by mouth daily.    . carisoprodol (SOMA) 350 MG tablet Take 350 mg by mouth 4 (four) times daily as needed for muscle spasms.    . cyclobenzaprine (FLEXERIL) 5 MG tablet Take 5 mg by mouth 3 (three) times daily as needed for muscle spasms.    . diphenhydrAMINE (BENADRYL) 25 mg capsule Take 25 mg by mouth at bedtime as needed.    Marland Kitchen EPINEPHRINE, ANAPHYLAXIS THERAPY AGENTS, Inject 0.3 mg into the muscle.    . estradiol (ESTRACE) 1 MG tablet Take 1 mg by mouth daily.    . fenofibrate (TRICOR) 145 MG tablet Take 145 mg by mouth daily.    . fluticasone (FLONASE) 50 MCG/ACT nasal spray Place 1 spray into both nostrils daily.    Marland Kitchen gabapentin (NEURONTIN) 300 MG capsule Take 300 mg by mouth at bedtime.    Marland Kitchen ibuprofen (ADVIL,MOTRIN) 800 MG tablet Take 800 mg by mouth every 8 (eight) hours as needed for moderate pain.    Marland Kitchen latanoprost (XALATAN) 0.005 % ophthalmic solution Place 1 drop into both eyes  at bedtime.    . montelukast (SINGULAIR) 10 MG tablet Take 10 mg by mouth at bedtime.    . potassium chloride SA (K-DUR,KLOR-CON) 20 MEQ tablet Take 20 mEq by mouth daily.    . predniSONE (DELTASONE) 50 MG tablet Take 1 tab 13 hours before ct scan, then 1 tab at 7 hours prior to ct scan, then 1 tab 1 hour prior to ct scan. 3 tablet 0  . psyllium (METAMUCIL) 58.6 % packet Take 1 packet by mouth daily.    . SitaGLIPtin-MetFORMIN HCl [phone] MG TB24 Take 1 tablet by mouth at bedtime.    . traMADol (ULTRAM) 50 MG tablet Take 50 mg by mouth every 6 (six) hours as needed for pain.     No current facility-administered medications on file prior to visit.     PAST MEDICAL HISTORY: Past Medical History:  Diagnosis Date  . Angiomyolipoma of left  kidney   . Arthritis    left foot  . Bronchitis   . Complication of anesthesia    Patient woke up during bunionectomy and breast cyst removal surgery  . Diabetes mellitus without complication ([place])   . Environmental and seasonal allergies   . GERD (gastroesophageal reflux disease)   . Glaucoma    Bilateral  . Hyperlipemia   . Hypertension   . Lower back pain   . Mini stroke ([place])   . Multiple food allergies    MSG, green peas, onions, cucumber, mustard, peanuts, corn, sesame, lettuce, red dye, perfumes  . Optic nerve disorder    left eye  . Pneumonia 2014  . PONV (postoperative nausea and vomiting)    Slight nausea  . Prediabetes   . Rheumatoid arthritis ([place])   . Sleep apnea   . Vitamin D deficiency     PAST SURGICAL HISTORY: Past Surgical History:  Procedure Laterality Date  . ABDOMINAL HYSTERECTOMY    . BACK SURGERY     X 4  . BREAST CYST EXCISION Bilateral    20+ years ago  . BUNIONECTOMY Bilateral   . CATARACT EXTRACTION    . CATARACT EXTRACTION, BILATERAL Bilateral 2019   with lens implant; Dr. Bing Plume. June 2019 Left eye; July 2019 Right eye.  Marland Kitchen COLONOSCOPY    . EYE SURGERY    . FINGER SURGERY Left    Thumb    SOCIAL HISTORY: Social History   Tobacco Use  . Smoking status: Never Smoker  . Smokeless tobacco: Never Used  Substance Use Topics  . Alcohol use: Yes    Alcohol/week: 0.0 standard drinks    Comment: once a year  . Drug use: No    FAMILY HISTORY: Family History  Adopted: Yes  Problem Relation Age of Onset  . Hyperlipidemia Mother   . Hypertension Mother   . Colon cancer Neg Hx   . Stomach cancer Neg Hx   . Esophageal cancer Neg Hx     ROS: Review of Systems  Constitutional: Positive for weight loss.  Respiratory: Negative for shortness of breath.   Cardiovascular: Negative for chest pain.  Gastrointestinal: Negative for nausea and vomiting.  Musculoskeletal:       Negative for muscle weakness.  Endo/Heme/Allergies:        Negative for hypoglycemia.    PHYSICAL EXAM: Blood pressure (!) 110/55, pulse 73, temperature 97.8 F (36.6 C), height 5\' 5"  (1.651 m), weight 188 lb (85.3 kg), SpO2 99 %. Body mass index is 31.28 kg/m. Physical Exam Vitals signs reviewed.  Constitutional:  Appearance: Normal appearance. She is obese.  Cardiovascular:     Rate and Rhythm: Normal rate.  Pulmonary:     Effort: Pulmonary effort is normal.  Musculoskeletal: Normal range of motion.  Skin:    General: Skin is warm and dry.  Neurological:     Mental Status: She is alert and oriented to person, place, and time.  Psychiatric:        Mood and Affect: Mood normal.        Behavior: Behavior normal.     RECENT LABS AND TESTS: BMET    Component Value Date/Time   NA 140 04/06/2018 1028   K 4.1 04/06/2018 1028   CL 102 04/06/2018 1028   CO2 22 04/06/2018 1028   GLUCOSE 88 04/06/2018 1028   GLUCOSE 96 12/09/2017 1020   BUN 16 04/06/2018 1028   CREATININE 0.95 04/06/2018 1028   CALCIUM 10.4 (H) 04/06/2018 1028   GFRNONAA 64 04/06/2018 1028   GFRAA 74 04/06/2018 1028   Lab Results  Component Value Date   HGBA1C 5.9 (H) 04/06/2018   HGBA1C 6.0 (H) 02/09/2015   Lab Results  Component Value Date   INSULIN 18.1 04/06/2018   CBC    Component Value Date/Time   WBC 9.0 04/06/2018 1028   WBC 9.2 12/09/2017 1020   RBC 4.09 04/06/2018 1028   RBC 3.97 12/09/2017 1020   HGB 12.5 04/06/2018 1028   HCT 39.0 04/06/2018 1028   PLT 390.0 12/09/2017 1020   MCV 95 04/06/2018 1028   MCH 30.6 04/06/2018 1028   MCH 32.0 02/09/2015 1103   MCHC 32.1 04/06/2018 1028   MCHC 33.4 12/09/2017 1020   RDW 12.9 04/06/2018 1028   LYMPHSABS 4.4 (H) 04/06/2018 1028   MONOABS 0.7 12/09/2017 1020   EOSABS 0.2 04/06/2018 1028   BASOSABS 0.1 04/06/2018 1028   Iron/TIBC/Ferritin/ %Sat No results found for: IRON, TIBC, FERRITIN, IRONPCTSAT Lipid Panel     Component Value Date/Time   CHOL 152 04/06/2018 1028   TRIG 144  04/06/2018 1028   HDL 42 04/06/2018 1028   LDLCALC 81 04/06/2018 1028   Hepatic Function Panel     Component Value Date/Time   PROT 7.9 04/06/2018 1028   ALBUMIN 4.4 04/06/2018 1028   AST 16 04/06/2018 1028   ALT 17 04/06/2018 1028   ALKPHOS 33 (L) 04/06/2018 1028   BILITOT 0.3 04/06/2018 1028   BILIDIR 0.1 10/02/2006 1620      Component Value Date/Time   TSH 1.820 04/06/2018 1028   TSH 0.62 10/02/2006 1620   Results for DEBROAH, SHUTTLEWORTH (MRN [id number]) as of 07/21/2018 09:43  Ref. Range 04/06/2018 10:28  Vitamin D, 25-Hydroxy Latest Ref Range: 30.0 - 100.0 ng/mL 30.6    OBESITY BEHAVIORAL INTERVENTION VISIT  Today's visit was # 7   Starting weight: 199 lbs Starting date: 04/06/18 Today's weight : Weight: 188 lb (85.3 kg)  Today's date: 07/21/2018 Total lbs lost to date: 11  ASK: We discussed the diagnosis of obesity with Araceli Bouche today and Ludy agreed to give [place] permission to discuss obesity behavioral modification therapy today.  ASSESS: Drew has the diagnosis of obesity and her BMI today is 31.2. Andra is in the action stage of change.   ADVISE: Dejia was educated on the multiple health risks of obesity as well as the benefit of weight loss to improve her health. She was advised of the need for long term treatment and the importance of lifestyle modifications to improve her current health  and to decrease her risk of future health problems.  AGREE: Multiple dietary modification options and treatment options were discussed and Margee agreed to follow the recommendations documented in the above note.  ARRANGE: Lynnsie was educated on the importance of frequent visits to treat obesity as outlined per CMS and USPSTF guidelines and agreed to schedule her next follow up appointment today.  I, Marcille Blanco, CMA, am acting as Location manager for [employer], FNP-C.  I have reviewed the above documentation for accuracy and completeness, and I agree with the  above.  - Brion Sossamon, FNP-C.

## 2018-07-22 LAB — COMPREHENSIVE METABOLIC PANEL
ALT: 20 IU/L (ref 0–32)
AST: 17 IU/L (ref 0–40)
Albumin/Globulin Ratio: 1.3 (ref 1.2–2.2)
Albumin: 4.2 g/dL (ref 3.8–4.8)
Alkaline Phosphatase: 28 IU/L — ABNORMAL LOW (ref 39–117)
BUN/Creatinine Ratio: 13 (ref 12–28)
BUN: 13 mg/dL (ref 8–27)
Bilirubin Total: 0.3 mg/dL (ref 0.0–1.2)
CO2: 21 mmol/L (ref 20–29)
Calcium: 9.9 mg/dL (ref 8.7–10.3)
Chloride: 103 mmol/L (ref 96–106)
Creatinine, Ser: 1 mg/dL (ref 0.57–1.00)
GFR calc Af Amer: 69 mL/min/{1.73_m2} (ref >59)
GFR calc non Af Amer: 60 mL/min/{1.73_m2} (ref >59)
Globulin, Total: 3.2 g/dL (ref 1.5–4.5)
Glucose: 87 mg/dL (ref 65–99)
Potassium: 4.3 mmol/L (ref 3.5–5.2)
Sodium: 139 mmol/L (ref 134–144)
Total Protein: 7.4 g/dL (ref 6.0–8.5)

## 2018-07-22 LAB — HEMOGLOBIN A1C
Est. average glucose Bld gHb Est-mCnc: 120 mg/dL
Hgb A1c MFr Bld: 5.8 % — ABNORMAL HIGH (ref 4.8–5.6)

## 2018-07-22 LAB — LIPID PANEL WITH LDL/HDL RATIO
Cholesterol, Total: 140 mg/dL (ref 100–199)
HDL: 38 mg/dL — ABNORMAL LOW (ref >39)
LDL Calculated: 74 mg/dL (ref 0–99)
LDl/HDL Ratio: 1.9 ratio (ref 0.0–3.2)
Triglycerides: 138 mg/dL (ref 0–149)
VLDL Cholesterol Cal: 28 mg/dL (ref 5–40)

## 2018-07-22 LAB — INSULIN, RANDOM: INSULIN: 29.4 u[IU]/mL — ABNORMAL HIGH (ref 2.6–24.9)

## 2018-07-22 LAB — VITAMIN D 25 HYDROXY (VIT D DEFICIENCY, FRACTURES): Vit D, 25-Hydroxy: 50 ng/mL (ref 30.0–100.0)

## 2018-08-04 ENCOUNTER — Ambulatory Visit (INDEPENDENT_AMBULATORY_CARE_PROVIDER_SITE_OTHER): Payer: 59 | Admitting: Family Medicine

## 2018-08-04 ENCOUNTER — Encounter (INDEPENDENT_AMBULATORY_CARE_PROVIDER_SITE_OTHER): Payer: Self-pay | Admitting: Family Medicine

## 2018-08-04 VITALS — BP 110/66 | HR 86 | Temp 98.4°F | Ht 65.0 in | Wt 186.0 lb

## 2018-08-04 DIAGNOSIS — Z6831 Body mass index (BMI) 31.0-31.9, adult: Secondary | ICD-10-CM | POA: Diagnosis not present

## 2018-08-04 DIAGNOSIS — E559 Vitamin D deficiency, unspecified: Secondary | ICD-10-CM | POA: Diagnosis not present

## 2018-08-04 DIAGNOSIS — Z6830 Body mass index (BMI) 30.0-30.9, adult: Secondary | ICD-10-CM

## 2018-08-04 DIAGNOSIS — E669 Obesity, unspecified: Secondary | ICD-10-CM

## 2018-08-04 NOTE — Progress Notes (Signed)
Office: [phone]  /  Fax: [phone]   HPI:   Chief Complaint: OBESITY [name] [name] is here to discuss her progress with her obesity treatment plan. She is on the Category 2 plan and is following her eating plan approximately 90% of the time. She states she is riding a bike, elliptical, and doing free weights 30-60 minutes 3-5 times per week. [name] [name] is currently doing well with the plan. Her weight is 186 lb (84.4 kg) today and has had a weight loss of 2 pounds over a period of 2 weeks since her last visit. She has lost 13 lbs since starting treatment with [place].  Vitamin D deficiency [name] [name] has a diagnosis of Vitamin D deficiency and is at goal. She is currently taking prescription Vit D and denies nausea, vomiting or muscle weakness.  ASSESSMENT AND PLAN:  Vitamin D deficiency  Class 1 obesity with serious comorbidity and body mass index (BMI) of 30.0 to 30.9 in adult, unspecified obesity type  PLAN:  Vitamin D Deficiency [name] [name] was informed that low Vitamin D levels contributes to fatigue and are associated with obesity, breast, and colon cancer. She will discontinue prescription Vit D and will start on OTC Vitamin D3 2,000 IU daily and will follow-up for routine testing of Vitamin D, at least 2-3 times per year. She was informed of the risk of over-replacement of Vitamin D and agrees to not increase her dose unless she discusses this with [place] first. [name] [name] agrees to follow-up with our clinic in 3 weeks.  Obesity [name] [name] is currently in the action stage of change. As such, her goal is to continue with weight loss efforts. We discussed goal weight and have decided on 170-175 lbs to be her goal weight. She has agreed to follow the Category 2 plan. [name] [name] has been instructed to continue her exercise regimen as above. We discussed the following Behavioral Modification Strategies today: planning for success.  [name] [name] has agreed to follow-up with our clinic in 3 weeks. She was informed of  the importance of frequent follow up visits to maximize her success with intensive lifestyle modifications for her multiple health conditions.  ALLERGIES: Allergies  Allergen Reactions  . Adhesive [Tape] Dermatitis  . Latex Dermatitis  . Codeine Nausea And Vomiting  . Erythromycin Itching and Swelling  . Amoxicillin Diarrhea  . Azithromycin   . Macrolides And Ketolides   . Other     FOOD ALLERGIES MSG  Green peas Onions cucumber Mustard Peanuts Corn sesame Yellow onions lettuce RED DYE NO PERFUMES  . Peanut-Containing Drug Products   . Potassium Clavulanate [Clavulanic Acid]   . Augmentin [Amoxicillin-Pot Clavulanate] Diarrhea    Per discussion 07/26/2012, pt tolerates Omnicef well.  . Iodinated Diagnostic Agents Itching    Tolerates iodinated contrast with Benadryl 50mg  PO prior to administration.  jkl    MEDICATIONS: Current Outpatient Medications on File Prior to Visit  Medication Sig Dispense Refill  . Amlodipine-Valsartan-HCTZ 5-160-12.5 MG TABS Take 1 tablet by mouth at bedtime.    Marland Kitchen aspirin EC 325 MG tablet Take 1 tablet (325 mg total) by mouth daily. 30 tablet 0  . atorvastatin (LIPITOR) 40 MG tablet Take 40 mg by mouth daily.    . carisoprodol (SOMA) 350 MG tablet Take 350 mg by mouth 4 (four) times daily as needed for muscle spasms.    . cholecalciferol (VITAMIN D3) 25 MCG (1000 UT) tablet Take 2,000 Units by mouth daily.    . cyclobenzaprine (FLEXERIL) 5 MG tablet Take 5 mg by  mouth 3 (three) times daily as needed for muscle spasms.    . diphenhydrAMINE (BENADRYL) 25 mg capsule Take 25 mg by mouth at bedtime as needed.    Marland Kitchen EPINEPHRINE, ANAPHYLAXIS THERAPY AGENTS, Inject 0.3 mg into the muscle.    . estradiol (ESTRACE) 1 MG tablet Take 1 mg by mouth daily.    . fenofibrate (TRICOR) 145 MG tablet Take 145 mg by mouth daily.    . fluticasone (FLONASE) 50 MCG/ACT nasal spray Place 1 spray into both nostrils daily.    Marland Kitchen gabapentin (NEURONTIN) 300 MG capsule Take  300 mg by mouth at bedtime.    Marland Kitchen ibuprofen (ADVIL,MOTRIN) 800 MG tablet Take 800 mg by mouth every 8 (eight) hours as needed for moderate pain.    Marland Kitchen latanoprost (XALATAN) 0.005 % ophthalmic solution Place 1 drop into both eyes at bedtime.    . montelukast (SINGULAIR) 10 MG tablet Take 10 mg by mouth at bedtime.    . potassium chloride SA (K-DUR,KLOR-CON) 20 MEQ tablet Take 20 mEq by mouth daily.    . predniSONE (DELTASONE) 50 MG tablet Take 1 tab 13 hours before ct scan, then 1 tab at 7 hours prior to ct scan, then 1 tab 1 hour prior to ct scan. 3 tablet 0  . psyllium (METAMUCIL) 58.6 % packet Take 1 packet by mouth daily.    . SitaGLIPtin-MetFORMIN HCl [phone] MG TB24 Take 1 tablet by mouth at bedtime.    . traMADol (ULTRAM) 50 MG tablet Take 50 mg by mouth every 6 (six) hours as needed for pain.     No current facility-administered medications on file prior to visit.     PAST MEDICAL HISTORY: Past Medical History:  Diagnosis Date  . Angiomyolipoma of left kidney   . Arthritis    left foot  . Bronchitis   . Complication of anesthesia    Patient woke up during bunionectomy and breast cyst removal surgery  . Diabetes mellitus without complication ([place])   . Environmental and seasonal allergies   . GERD (gastroesophageal reflux disease)   . Glaucoma    Bilateral  . Hyperlipemia   . Hypertension   . Lower back pain   . Mini stroke ([place])   . Multiple food allergies    MSG, green peas, onions, cucumber, mustard, peanuts, corn, sesame, lettuce, red dye, perfumes  . Optic nerve disorder    left eye  . Pneumonia 2014  . PONV (postoperative nausea and vomiting)    Slight nausea  . Prediabetes   . Rheumatoid arthritis ([place])   . Sleep apnea   . Vitamin D deficiency     PAST SURGICAL HISTORY: Past Surgical History:  Procedure Laterality Date  . ABDOMINAL HYSTERECTOMY    . BACK SURGERY     X 4  . BREAST CYST EXCISION Bilateral    20+ years ago  . BUNIONECTOMY Bilateral   .  CATARACT EXTRACTION    . CATARACT EXTRACTION, BILATERAL Bilateral 2019   with lens implant; Dr. Bing Plume. June 2019 Left eye; July 2019 Right eye.  Marland Kitchen COLONOSCOPY    . EYE SURGERY    . FINGER SURGERY Left    Thumb    SOCIAL HISTORY: Social History   Tobacco Use  . Smoking status: Never Smoker  . Smokeless tobacco: Never Used  Substance Use Topics  . Alcohol use: Yes    Alcohol/week: 0.0 standard drinks    Comment: once a year  . Drug use: No    FAMILY  HISTORY: Family History  Adopted: Yes  Problem Relation Age of Onset  . Hyperlipidemia Mother   . Hypertension Mother   . Colon cancer Neg Hx   . Stomach cancer Neg Hx   . Esophageal cancer Neg Hx    ROS: Review of Systems  Constitutional: Positive for weight loss.  Gastrointestinal: Negative for nausea and vomiting.  Musculoskeletal:       Negative for muscle weakness.  Endo/Heme/Allergies:       Negative for hypoglycemia.   PHYSICAL EXAM: Blood pressure 110/66, pulse 86, temperature 98.4 F (36.9 C), temperature source Oral, height 5\' 5"  (1.651 m), weight 186 lb (84.4 kg), SpO2 100 %. Body mass index is 30.95 kg/m. Physical Exam Vitals signs reviewed.  Constitutional:      Appearance: Normal appearance. She is obese.  Cardiovascular:     Rate and Rhythm: Normal rate.     Pulses: Normal pulses.  Pulmonary:     Effort: Pulmonary effort is normal.     Breath sounds: Normal breath sounds.  Musculoskeletal: Normal range of motion.  Skin:    General: Skin is warm and dry.  Neurological:     Mental Status: She is alert and oriented to person, place, and time.  Psychiatric:        Behavior: Behavior normal.   RECENT LABS AND TESTS: BMET    Component Value Date/Time   NA 139 07/21/2018 0827   K 4.3 07/21/2018 0827   CL 103 07/21/2018 0827   CO2 21 07/21/2018 0827   GLUCOSE 87 07/21/2018 0827   GLUCOSE 96 12/09/2017 1020   BUN 13 07/21/2018 0827   CREATININE 1.00 07/21/2018 0827   CALCIUM 9.9 07/21/2018  0827   GFRNONAA 60 07/21/2018 0827   GFRAA 69 07/21/2018 0827   Lab Results  Component Value Date   HGBA1C 5.8 (H) 07/21/2018   HGBA1C 5.9 (H) 04/06/2018   HGBA1C 6.0 (H) 02/09/2015   Lab Results  Component Value Date   INSULIN 29.4 (H) 07/21/2018   INSULIN 18.1 04/06/2018   CBC    Component Value Date/Time   WBC 9.0 04/06/2018 1028   WBC 9.2 12/09/2017 1020   RBC 4.09 04/06/2018 1028   RBC 3.97 12/09/2017 1020   HGB 12.5 04/06/2018 1028   HCT 39.0 04/06/2018 1028   PLT 390.0 12/09/2017 1020   MCV 95 04/06/2018 1028   MCH 30.6 04/06/2018 1028   MCH 32.0 02/09/2015 1103   MCHC 32.1 04/06/2018 1028   MCHC 33.4 12/09/2017 1020   RDW 12.9 04/06/2018 1028   LYMPHSABS 4.4 (H) 04/06/2018 1028   MONOABS 0.7 12/09/2017 1020   EOSABS 0.2 04/06/2018 1028   BASOSABS 0.1 04/06/2018 1028   Iron/TIBC/Ferritin/ %Sat No results found for: IRON, TIBC, FERRITIN, IRONPCTSAT Lipid Panel     Component Value Date/Time   CHOL 140 07/21/2018 0827   TRIG 138 07/21/2018 0827   HDL 38 (L) 07/21/2018 0827   LDLCALC 74 07/21/2018 0827   Hepatic Function Panel     Component Value Date/Time   PROT 7.4 07/21/2018 0827   ALBUMIN 4.2 07/21/2018 0827   AST 17 07/21/2018 0827   ALT 20 07/21/2018 0827   ALKPHOS 28 (L) 07/21/2018 0827   BILITOT 0.3 07/21/2018 0827   BILIDIR 0.1 10/02/2006 1620      Component Value Date/Time   TSH 1.820 04/06/2018 1028   TSH 0.62 10/02/2006 1620    Ref. Range 07/21/2018 08:27  Vitamin D, 25-Hydroxy Latest Ref Range: 30.0 - 100.0 ng/mL  50.0   OBESITY BEHAVIORAL INTERVENTION VISIT  Today's visit was #8  Starting weight: 199 lbs Starting date: 04/06/2018 Today's weight: 186 lbs Today's date: 08/04/2018 Total lbs lost to date: 13 At least 15 minutes were spent on discussing the following behavioral intervention visit.    08/04/2018  Height 5\' 5"  (1.651 m)  Weight 186 lb (84.4 kg)  BMI (Calculated) 30.95  BLOOD PRESSURE - SYSTOLIC 383  BLOOD PRESSURE -  DIASTOLIC 66   Body Fat % 81.8 %  Total Body Water (lbs) 74.2 lbs   ASK: We discussed the diagnosis of obesity with [name] [name] today and [name] [name] agreed to give [place] permission to discuss obesity behavioral modification therapy today.  ASSESS: [name] [name] has the diagnosis of obesity and her BMI today is 30.95. Mikeya is in the action stage of change.   ADVISE: [name] [name] was educated on the multiple health risks of obesity as well as the benefit of weight loss to improve her health. She was advised of the need for long term treatment and the importance of lifestyle modifications to improve her current health and to decrease her risk of future health problems.  AGREE: Multiple dietary modification options and treatment options were discussed and  [name] [name] agreed to follow the recommendations documented in the above note.  ARRANGE: [name] [name] was educated on the importance of frequent visits to treat obesity as outlined per CMS and USPSTF guidelines and agreed to schedule her next follow up appointment today.  IMichaelene Song, am acting as Location manager for [employer], FNP-C.  I have reviewed the above documentation for accuracy and completeness, and I agree with the above.  -  , FNP-C.

## 2018-08-26 ENCOUNTER — Encounter (INDEPENDENT_AMBULATORY_CARE_PROVIDER_SITE_OTHER): Payer: Self-pay

## 2018-08-26 ENCOUNTER — Ambulatory Visit (INDEPENDENT_AMBULATORY_CARE_PROVIDER_SITE_OTHER): Payer: 59 | Admitting: Family Medicine

## 2018-10-20 ENCOUNTER — Encounter (INDEPENDENT_AMBULATORY_CARE_PROVIDER_SITE_OTHER): Payer: 59 | Admitting: Ophthalmology

## 2019-04-04 ENCOUNTER — Ambulatory Visit: Payer: BLUE CROSS/BLUE SHIELD | Admitting: Adult Health

## 2019-04-04 ENCOUNTER — Ambulatory Visit: Payer: 59 | Admitting: Family Medicine

## 2019-07-21 ENCOUNTER — Other Ambulatory Visit: Payer: Self-pay | Admitting: Obstetrics and Gynecology

## 2019-07-21 DIAGNOSIS — Z1231 Encounter for screening mammogram for malignant neoplasm of breast: Secondary | ICD-10-CM

## 2019-07-22 ENCOUNTER — Other Ambulatory Visit: Payer: Self-pay | Admitting: Obstetrics and Gynecology

## 2019-07-22 DIAGNOSIS — Z13820 Encounter for screening for osteoporosis: Secondary | ICD-10-CM

## 2019-08-10 ENCOUNTER — Encounter: Payer: Self-pay | Admitting: Family Medicine

## 2019-08-10 ENCOUNTER — Other Ambulatory Visit: Payer: Self-pay

## 2019-08-10 ENCOUNTER — Ambulatory Visit: Payer: Medicare Other | Admitting: Family Medicine

## 2019-08-10 VITALS — BP 144/72 | HR 99 | Temp 97.3°F | Ht 65.0 in | Wt 204.6 lb

## 2019-08-10 DIAGNOSIS — I6381 Other cerebral infarction due to occlusion or stenosis of small artery: Secondary | ICD-10-CM | POA: Diagnosis not present

## 2019-08-10 DIAGNOSIS — G4733 Obstructive sleep apnea (adult) (pediatric): Secondary | ICD-10-CM | POA: Diagnosis not present

## 2019-08-10 DIAGNOSIS — Z9989 Dependence on other enabling machines and devices: Secondary | ICD-10-CM | POA: Diagnosis not present

## 2019-08-10 NOTE — Progress Notes (Addendum)
PATIENT: [name] [name] DOB: 1956/05/17  REASON FOR VISIT: follow up HISTORY FROM: patient  Chief Complaint  Patient presents with  . Follow-up    Yearly f/u. Alone. Rm 5. No new concerns at this time.      HISTORY OF PRESENT ILLNESS: Today 08/10/19 [name] [name] is a 64 y.o. female here today for follow up post lacunar stroke in 2019 and OSA.  She reports that, overall, she is doing well.  She is followed closely by primary care.  She reports that blood pressures are typically well controlled.  She continues aspirin and atorvastatin for stroke prevention.  She reports that last A1c was 6.  No stroke symptoms since last being seen.  She admits that she continues to have difficulty with compliance using CPAP.  She feels like at the beginning of the year she was doing much better but then with stress from the pandemic and recently selling her home, she has fallen behind with compliance.  She does note benefit of using CPAP.  She is motivated to continue use.  Compliance report dated 07/10/2019 through 08/08/2019 reveals that she used CPAP 10 of the past 30 days for compliance of 33%.  She used CPAP greater than 4 hours 9 of the last 30 days for compliance of 30%.  Average usage on days used was 5 hours and 22 minutes.  Residual AHI was 0.1 on 5 to 12 cm of water and an EPR of 3.  There was no significant leak noted.  HISTORY: (copied from  note on 03/30/2018)  HPI:  [name] [name] is a 64 y.o. female patient, seen here in a Rv 03-30-2018,  I have the pleasure of meeting Ms. [name] [name] today she had a home sleep test by apnea link on 14 December 2017 and had to my surprise a rather moderate to severe degree of obstructive sleep apnea at an AHI of 25.7, RDI of 27.6 indicating that additional moderate snoring was present she also had a long this oxygenation time 28 minutes of her 240 minutes of test time were with out sufficient oxygenation.  The nadir for oxygen was 78%, but heart rate  between 6160 bpm.  I recommended that she should try CPAP for obstructive sleep apnea at this degree.    CPAP was initiated and from mid-September on the patient began to use it more compliantly.  36 September to  22 October she used it for about 68% of the time with an average day user time of 6 hours 5 minutes, the residual AHI is 0.1 the pressure at the 95th percentile is 10.3 and we had another download here today over the last 30 days and unfortunately he the compliance is only 60% but the AHI remains very low at 0.1/h the 95th percentile pressure is 10.5 cm water.  I would like to increase the maximum pressure on her CPAP and urged her to use the machine each night for 4 hours minimum. She has used a nasal pillow dream wear- and cant sleep on her side as well.   CONSULT:  [name] [name] reports that she feels usually refreshed and rested after sleep, but often sleeps only 2 hours of sleep at night.  Her medical history includes hyperlipidemia, diabetes, hypertension, migraines, irritable bowel syndrome and a recent MRI of the brain that showed small vessel ischemia and a remote lacunar infarct in the left thalamus.  Overall it was unlikely that this gave her any symptoms of significance.  She also has gained weight over the last 5 to 10 years.  Was referred for healthy weight and wellness to Dr. Leafy Ro.  Dr. Jaynee Eagles wanted her to be evaluated for the possible presence of sleep apnea considering her risk for recurrent strokes and TIAs.  The patient continues on aspirin, blood pressure goal is to keep blood pressure below 130/90 mmHg, and her hemoglobin A1c below 6.5%.    Chief complaint according to patient : " Sometimes I just fall asleep, mostly I don't need a lot of sleep- I watch TV at night"   Sleep habits are as follows: "I am a night owl, I go to bed at 10 o'clock and I am asleep whenever.  Soma and gabapentin help to fall asleep. I can go 3 nights with 6-8 hours of sleep, and others with 2-3  hours". Husband sleeps immediately when he lays down. The marital  bedroom is cool (74 degrees is considered cool by her), with a ceiling fan.  Sleeps on 2 pillows and on her side, still has hot flushes, still menopausal symptoms- 20 years after total hysterectomy and remains on HRT. The TV is running all night -and she wakes up when husband switches it off.  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 She reports not being angry, upset, anxious or under an unusual amount of stress. Rises at 5-6 AM. Husband stated she snores, as do their children, have noted irregular breathing. She doubts that. She has nocturia once each night. She sleeps in intervals of 2- 4 hours, and when waking up has no headaches, neither pain, but a dry mouth. She has this sleep pattern for a long time, many years.  She may spend a whole day in bed sleeping on and off after a day where she had to be on her feet a lot.    Sleep medical history and family sleep history: unaware of biological family history. Mother deceased.  The patient reports that she has chronic back pain and has had multiple back surgeries, for total.  She is seen Dr. Saintclair Halsted for this.  She is not able to sit comfortably for longer than an hour or stand for longer than an hour.  She tries to move around or lays down to relieve her back pain.  She feels however that she does not need much sleep, and she watches TV at night in her bedroom.  Her history of diabetes hypertension and hyperlipidemia has been reviewed above.  Her current BMI is below 35.57.  Social history: 2 sons and one daughter, healthy- all adult.  lives with husband, shares her bedroom. Disabled due to back problems, 2 years. Worked in [employer], office job, sitting.  Non smoker, ETOH - once a month a drink. Caffeine; none.   Dr. Cathren Laine note from 09-11-2017 : [name] [name] is a 64 y.o. female here as a referral from Dr. Criss Rosales for abnormal MRI.  Past medical history hyperlipidemia,  diabetes, HTN, migraine, IBS. Patient was having neck pain and had MRI cervical spine  and saw something in the brain and a follow up was initiated. No difficulties with memory or balance, she has back pain and symptoms related to that but no significant balance issues or memory issues. No onset of symptoms, no acute onset of paresthesias however she does have problems in the legs due to back. She went due to problems in the arm, have been going on for the last 6 months, worsening with neck pain. Her grip  on the right is worsening. She snores and stops breathing in her sleep.  History (copied from Dr Cathren Laine note on 03/15/2018)  Interval history: Here for follow up of stroke. This is a 64 year old patient who was referred for abnormal MRI.  Past medical history includes hyperlipidemia, diabetes, hypertension, migraine, IBS.  MRI of the brain showed chronic small vessel ischemia and a remote lacunar infarct in the left thalamus. Diagnosed with sleep apnea and is now using a cpap. She had moderately severe OSA AHI 26. She does not know if she is feeling better. She has an appointment with Dr. Brett Fairy the end of the month. Otherwise doing well, no problems, just having neck issues and ongoing dry needling. Reviewed report, she is doing well > 70% using cpap >=4 hours.   HPI:  [name] [name] is a 64 y.o. female here as a referral from Dr. Domingo Madeira for abnormal MRI.  Past medical history hyperlipidemia, diabetes, HTN, migraine, IBS. Patient was having neck pain and had mri cervical and saw something in the brain and a follow up was initiated. No difficulties with memory or balance, she has back pain and symptoms related to that but no significant balance issues or memory issues. No onset of symptoms, no acute onset of paresthesias however she does have problems in the legs due to back. She went due to problems in the arm, have been going on for the last 6 months, worsening with neck pain. Her grip on the right is  worsening. She snores and stops breathing in her sleep.  Reviewed notes, labs and imaging from outside physicians, which showed:  Reviewed referring physician notes for lesion of the brainstem.  MRI of the brain was performed by [facility].  Overall she remains asymptomatic.  She has chronic neck and back pain and she is doing physical therapy and working with pain management.  Reviewed thorough complete neurologic exam which was nonfocal.  MRI of the brain shows increased T2 signal in her brainstem with what appears to be expansion of the pons and small vessel ischemic changes throughout the white matter cerebral hemispheres.  Personally reviewed MRI brain images and agree with the following:  IMPRESSION: Pontine signal abnormality on previous cervical MRI is most likely chronic small vessel ischemia, which is also seen to a moderate degree in the cerebral white matter. There is remote lacunar infarct in the left thalamus. If there are bulbar symptoms a follow-up could be obtained to document stability.  hgba1c 6 2016    REVIEW OF SYSTEMS: Out of a complete 14 system review of symptoms, the patient complains only of the following symptoms, none and all other reviewed systems are negative.  ESS: 2 FSS: 9    ALLERGIES: Allergies  Allergen Reactions  . Adhesive [Tape] Dermatitis  . Latex Dermatitis  . Codeine Nausea And Vomiting  . Erythromycin Itching and Swelling  . Amoxicillin Diarrhea  . Azithromycin   . Macrolides And Ketolides   . Other     FOOD ALLERGIES MSG  Green peas Onions cucumber Mustard Peanuts Corn sesame Yellow onions lettuce RED DYE NO PERFUMES  . Peanut-Containing Drug Products   . Potassium Clavulanate [Clavulanic Acid]   . Augmentin [Amoxicillin-Pot Clavulanate] Diarrhea    Per discussion 07/26/2012, pt tolerates Omnicef well.  . Iodinated Diagnostic Agents Itching    Tolerates iodinated contrast with Benadryl 50mg  PO prior to  administration.  jkl    HOME MEDICATIONS: Outpatient Medications Prior to Visit  Medication Sig Dispense Refill  .  Amlodipine-Valsartan-HCTZ 5-160-12.5 MG TABS Take 1 tablet by mouth at bedtime.    Marland Kitchen aspirin EC 325 MG tablet Take 1 tablet (325 mg total) by mouth daily. 30 tablet 0  . atorvastatin (LIPITOR) 40 MG tablet Take 40 mg by mouth daily.    . cholecalciferol (VITAMIN D3) 25 MCG (1000 UT) tablet Take 2,000 Units by mouth daily.    . cyclobenzaprine (FLEXERIL) 5 MG tablet Take 5 mg by mouth 3 (three) times daily as needed for muscle spasms.    Marland Kitchen EPINEPHRINE, ANAPHYLAXIS THERAPY AGENTS, Inject 0.3 mg into the muscle.    . estradiol (ESTRACE) 1 MG tablet Take 1 mg by mouth daily.    . fenofibrate (TRICOR) 145 MG tablet Take 145 mg by mouth daily.    . fluticasone (FLONASE) 50 MCG/ACT nasal spray Place 1 spray into both nostrils daily.    Marland Kitchen ibuprofen (ADVIL,MOTRIN) 800 MG tablet Take 800 mg by mouth every 8 (eight) hours as needed for moderate pain.    Marland Kitchen latanoprost (XALATAN) 0.005 % ophthalmic solution Place 1 drop into both eyes at bedtime.    . montelukast (SINGULAIR) 10 MG tablet Take 10 mg by mouth at bedtime.    . potassium chloride SA (K-DUR,KLOR-CON) 20 MEQ tablet Take 20 mEq by mouth daily.    . psyllium (METAMUCIL) 58.6 % packet Take 1 packet by mouth daily.    . SitaGLIPtin-MetFORMIN HCl [id number] MG TB24 Take 1 tablet by mouth at bedtime.    . carisoprodol (SOMA) 350 MG tablet Take 350 mg by mouth 4 (four) times daily as needed for muscle spasms.    . diphenhydrAMINE (BENADRYL) 25 mg capsule Take 25 mg by mouth at bedtime as needed.    . gabapentin (NEURONTIN) 300 MG capsule Take 300 mg by mouth at bedtime.    . predniSONE (DELTASONE) 50 MG tablet Take 1 tab 13 hours before ct scan, then 1 tab at 7 hours prior to ct scan, then 1 tab 1 hour prior to ct scan. 3 tablet 0  . traMADol (ULTRAM) 50 MG tablet Take 50 mg by mouth every 6 (six) hours as needed for pain.     No  facility-administered medications prior to visit.    PAST MEDICAL HISTORY: Past Medical History:  Diagnosis Date  . Angiomyolipoma of left kidney   . Arthritis    left foot  . Bronchitis   . Complication of anesthesia    Patient woke up during bunionectomy and breast cyst removal surgery  . Diabetes mellitus without complication ([place])   . Environmental and seasonal allergies   . GERD (gastroesophageal reflux disease)   . Glaucoma    Bilateral  . Hyperlipemia   . Hypertension   . Lower back pain   . Mini stroke ([place])   . Multiple food allergies    MSG, green peas, onions, cucumber, mustard, peanuts, corn, sesame, lettuce, red dye, perfumes  . Optic nerve disorder    left eye  . Pneumonia 2014  . PONV (postoperative nausea and vomiting)    Slight nausea  . Prediabetes   . Rheumatoid arthritis ([place])   . Sleep apnea   . Vitamin D deficiency     PAST SURGICAL HISTORY: Past Surgical History:  Procedure Laterality Date  . ABDOMINAL HYSTERECTOMY    . BACK SURGERY     X 4  . BREAST CYST EXCISION Bilateral    20+ years ago  . BUNIONECTOMY Bilateral   . CATARACT EXTRACTION    . CATARACT  EXTRACTION, BILATERAL Bilateral 2019   with lens implant; Dr. Bing Plume. June 2019 Left eye; July 2019 Right eye.  Marland Kitchen COLONOSCOPY    . EYE SURGERY    . FINGER SURGERY Left    Thumb    FAMILY HISTORY: Family History  Adopted: Yes  Problem Relation Age of Onset  . Hyperlipidemia Mother   . Hypertension Mother   . Colon cancer Neg Hx   . Stomach cancer Neg Hx   . Esophageal cancer Neg Hx     SOCIAL HISTORY: Social History   Socioeconomic History  . Marital status: Married    Spouse name: Gaia Tetrault  . Number of children: 2  . Years of education: 14+  . Highest education level: Some college, no degree  Occupational History  . Occupation: Retired   Tobacco Use  . Smoking status: Never Smoker  . Smokeless tobacco: Never Used  Substance and Sexual Activity  . Alcohol use: Yes      Alcohol/week: 0.0 standard drinks    Comment: once a year  . Drug use: No  . Sexual activity: Not on file  Other Topics Concern  . Not on file  Social History Narrative   Married. She has 2 daughters. She works in Press photographer. No caffeine- allergic    Right handed         Social Determinants of Health   Financial Resource Strain:   . Difficulty of Paying Living Expenses: Not on file  Food Insecurity:   . Worried About Charity fundraiser in the Last Year: Not on file  . Ran Out of Food in the Last Year: Not on file  Transportation Needs:   . Lack of Transportation (Medical): Not on file  . Lack of Transportation (Non-Medical): Not on file  Physical Activity:   . Days of Exercise per Week: Not on file  . Minutes of Exercise per Session: Not on file  Stress:   . Feeling of Stress : Not on file  Social Connections:   . Frequency of Communication with Friends and Family: Not on file  . Frequency of Social Gatherings with Friends and Family: Not on file  . Attends Religious Services: Not on file  . Active Member of Clubs or Organizations: Not on file  . Attends Archivist Meetings: Not on file  . Marital Status: Not on file  Intimate Partner Violence:   . Fear of Current or Ex-Partner: Not on file  . Emotionally Abused: Not on file  . Physically Abused: Not on file  . Sexually Abused: Not on file      PHYSICAL EXAM  Vitals:   08/10/19 0722  BP: (!) 144/72  Pulse: 99  Temp: (!) 97.3 F (36.3 C)  TempSrc: Oral  Weight: 204 lb 9.6 oz (92.8 kg)  Height: 5\' 5"  (1.651 m)   Body mass index is 34.05 kg/m.  Generalized: Well developed, in no acute distress  Cardiology: normal rate and rhythm, no murmur noted Respiratory: Clear to auscultation bilaterally Neurological examination  Mentation: Alert oriented to time, place, history taking. Follows all commands speech and language fluent Cranial nerve II-XII: Pupils were equal round reactive to light.  Extraocular movements were full, visual field were full on confrontational test. Facial sensation and strength were normal. Uvula tongue midline. Head turning and shoulder shrug  were normal and symmetric. Motor: The motor testing reveals 5 over 5 strength of all 4 extremities. Good symmetric motor tone is noted throughout.  Sensory: Sensory testing is  intact to soft touch on all 4 extremities. No evidence of extinction is noted.  Coordination: Cerebellar testing reveals good finger-nose-finger and heel-to-shin bilaterally.  Gait and station: Gait is normal.   DIAGNOSTIC DATA (LABS, IMAGING, TESTING) - I reviewed patient records, labs, notes, testing and imaging myself where available.  No flowsheet data found.   Lab Results  Component Value Date   WBC 9.0 04/06/2018   HGB 12.5 04/06/2018   HCT 39.0 04/06/2018   MCV 95 04/06/2018   PLT 390.0 12/09/2017      Component Value Date/Time   NA 139 07/21/2018 0827   K 4.3 07/21/2018 0827   CL 103 07/21/2018 0827   CO2 21 07/21/2018 0827   GLUCOSE 87 07/21/2018 0827   GLUCOSE 96 12/09/2017 1020   BUN 13 07/21/2018 0827   CREATININE 1.00 07/21/2018 0827   CALCIUM 9.9 07/21/2018 0827   PROT 7.4 07/21/2018 0827   ALBUMIN 4.2 07/21/2018 0827   AST 17 07/21/2018 0827   ALT 20 07/21/2018 0827   ALKPHOS 28 (L) 07/21/2018 0827   BILITOT 0.3 07/21/2018 0827   GFRNONAA 60 07/21/2018 0827   GFRAA 69 07/21/2018 0827   Lab Results  Component Value Date   CHOL 140 07/21/2018   HDL 38 (L) 07/21/2018   LDLCALC 74 07/21/2018   TRIG 138 07/21/2018   Lab Results  Component Value Date   HGBA1C 5.8 (H) 07/21/2018   Lab Results  Component Value Date   VITAMINB12 392 04/06/2018   Lab Results  Component Value Date   TSH 1.820 04/06/2018       ASSESSMENT AND PLAN 64 y.o. year old female  has a past medical history of Angiomyolipoma of left kidney, Arthritis, Bronchitis, Complication of anesthesia, Diabetes mellitus without complication  ([place]), Environmental and seasonal allergies, GERD (gastroesophageal reflux disease), Glaucoma, Hyperlipemia, Hypertension, Lower back pain, Mini stroke ([place]), Multiple food allergies, Optic nerve disorder, Pneumonia (2014), PONV (postoperative nausea and vomiting), Prediabetes, Rheumatoid arthritis ([place]), Sleep apnea, and Vitamin D deficiency. here with     ICD-10-CM   1. OSA on CPAP  G47.33    Z99.89   2. Lacunar stroke [facility])  I63.81     Ms. Kulikowski is doing well from a stroke perspective.  She is following with primary care for management of comorbidities and stroke prevention.  She was advised to continue close monitoring of her blood pressure and work on healthy lifestyle habits with well-balanced, low glycemic diet, regular exercise and adequate hydration.  We have discussed concerns with compliance of CPAP therapy.  I have educated her on the risk of untreated sleep apnea including an elevated risk of stroke or heart attack.  She was encouraged CPAP nightly and for greater than 4 hours each night.  She reports that insurance is not providing any coverage for her machine or supplies at this time.  She will follow-up with me in 3 months to review compliance.  She verbalizes understanding and agreement with this plan.   No orders of the defined types were placed in this encounter.    No orders of the defined types were placed in this encounter.     I spent 30 minutes with the patient. 50% of this time was spent counseling and educating patient on plan of care and medications.    Debbora Presto, FNP-C 08/10/2019, 10:25 AM [facility] [address] [place], [place] [address] [phone]  Made any corrections needed, and agree with history, physical, neuro exam,assessment and  plan as stated.     Sarina Ill, MD [facility]

## 2019-08-10 NOTE — Patient Instructions (Addendum)
Please continue using your CPAP regularly. While your insurance requires that you use CPAP at least 4 hours each night on 70% of the nights, I recommend, that you not skip any nights and use it throughout the night if you can. Getting used to CPAP and staying with the treatment long term does take time and patience and discipline. Untreated obstructive sleep apnea when it is moderate to severe can have an adverse impact on cardiovascular health and raise her risk for heart disease, arrhythmias, hypertension, congestive heart failure, stroke and diabetes. Untreated obstructive sleep apnea causes sleep disruption, nonrestorative sleep, and sleep deprivation. This can have an impact on your day to day functioning and cause daytime sleepiness and impairment of cognitive function, memory loss, mood disturbance, and problems focussing. Using CPAP regularly can improve these symptoms.  Please continue close follow up with PCP for management of co morbidities and stroke prevention. Focus on low glycemic diet and regular exercise    Follow up with me in 3-6 months for CPAP compliance review.    Sleep Apnea Sleep apnea affects breathing during sleep. It causes breathing to stop for a short time or to become shallow. It can also increase the risk of:  Heart attack.  Stroke.  Being very overweight (obese).  Diabetes.  Heart failure.  Irregular heartbeat. The goal of treatment is to help you breathe normally again. What are the causes? There are three kinds of sleep apnea:  Obstructive sleep apnea. This is caused by a blocked or collapsed airway.  Central sleep apnea. This happens when the brain does not send the right signals to the muscles that control breathing.  Mixed sleep apnea. This is a combination of obstructive and central sleep apnea. The most common cause of this condition is a collapsed or blocked airway. This can happen if:  Your throat muscles are too relaxed.  Your tongue and  tonsils are too large.  You are overweight.  Your airway is too small. What increases the risk?  Being overweight.  Smoking.  Having a small airway.  Being older.  Being female.  Drinking alcohol.  Taking medicines to calm yourself (sedatives or tranquilizers).  Having family members with the condition. What are the signs or symptoms?  Trouble staying asleep.  Being sleepy or tired during the day.  Getting angry a lot.  Loud snoring.  Headaches in the morning.  Not being able to focus your mind (concentrate).  Forgetting things.  Less interest in sex.  Mood swings.  Personality changes.  Feelings of sadness (depression).  Waking up a lot during the night to pee (urinate).  Dry mouth.  Sore throat. How is this diagnosed?  Your medical history.  A physical exam.  A test that is done when you are sleeping (sleep study). The test is most often done in a sleep lab but may also be done at home. How is this treated?   Sleeping on your side.  Using a medicine to get rid of mucus in your nose (decongestant).  Avoiding the use of alcohol, medicines to help you relax, or certain pain medicines (narcotics).  Losing weight, if needed.  Changing your diet.  Not smoking.  Using a machine to open your airway while you sleep, such as: ? An oral appliance. This is a mouthpiece that shifts your lower jaw forward. ? A CPAP device. This device blows air through a mask when you breathe out (exhale). ? An EPAP device. This has valves that you put in  each nostril. ? A BPAP device. This device blows air through a mask when you breathe in (inhale) and breathe out.  Having surgery if other treatments do not work. It is important to get treatment for sleep apnea. Without treatment, it can lead to:  High blood pressure.  Coronary artery disease.  In men, not being able to have an erection (impotence).  Reduced thinking ability. Follow these instructions at  home: Lifestyle  Make changes that your doctor recommends.  Eat a healthy diet.  Lose weight if needed.  Avoid alcohol, medicines to help you relax, and some pain medicines.  Do not use any products that contain nicotine or tobacco, such as cigarettes, e-cigarettes, and chewing tobacco. If you need help quitting, ask your doctor. General instructions  Take over-the-counter and prescription medicines only as told by your doctor.  If you were given a machine to use while you sleep, use it only as told by your doctor.  If you are having surgery, make sure to tell your doctor you have sleep apnea. You may need to bring your device with you.  Keep all follow-up visits as told by your doctor. This is important. Contact a doctor if:  The machine that you were given to use during sleep bothers you or does not seem to be working.  You do not get better.  You get worse. Get help right away if:  Your chest hurts.  You have trouble breathing in enough air.  You have an uncomfortable feeling in your back, arms, or stomach.  You have trouble talking.  One side of your body feels weak.  A part of your face is hanging down. These symptoms may be an emergency. Do not wait to see if the symptoms will go away. Get medical help right away. Call your local emergency services (911 in the [place] Do not drive yourself to the hospital. Summary  This condition affects breathing during sleep.  The most common cause is a collapsed or blocked airway.  The goal of treatment is to help you breathe normally while you sleep. This information is not intended to replace advice given to you by your health care provider. Make sure you discuss any questions you have with your health care provider. Document Revised: 03/05/2018 Document Reviewed: 01/12/2018 Elsevier Patient Education  [place].   Stroke Prevention Some medical conditions and lifestyle choices can lead to a higher risk for a  stroke. You can help to prevent a stroke by making nutrition, lifestyle, and other changes. What nutrition changes can be made?   Eat healthy foods. ? Choose foods that are high in fiber. These include:  Fresh fruits.  Fresh vegetables.  Whole grains. ? Eat at least 5 or more servings of fruits and vegetables each day. Try to fill half of your plate at each meal with fruits and vegetables. ? Choose lean protein foods. These include:  Lowfat (lean) cuts of meat.  Chicken without skin.  Fish.  Tofu.  Beans.  Nuts. ? Eat low-fat dairy products. ? Avoid foods that:  Are high in salt (sodium).  Have saturated fat.  Have trans fat.  Have cholesterol.  Are processed.  Are premade.  Follow eating guidelines as told by your doctor. These may include: ? Reducing how many calories you eat and drink each day. ? Limiting how much salt you eat or drink each day to 1,500 milligrams (mg). ? Using only healthy fats for cooking. These include:  Olive oil.  Canola oil.  Sunflower oil. ? Counting how many carbohydrates you eat and drink each day. What lifestyle changes can be made?  Try to stay at a healthy weight. Talk to your doctor about what a good weight is for you.  Get at least 30 minutes of moderate physical activity at least 5 days a week. This can include: ? Fast walking. ? Biking. ? Swimming.  Do not use any products that have nicotine or tobacco. This includes cigarettes and e-cigarettes. If you need help quitting, ask your doctor. Avoid being around tobacco smoke in general.  Limit how much alcohol you drink to no more than 1 drink a day for nonpregnant women and 2 drinks a day for men. One drink equals 12 oz of beer, 5 oz of wine, or 1 oz of hard liquor.  Do not use drugs.  Avoid taking birth control pills. Talk to your doctor about the risks of taking birth control pills if: ? You are over 62 years old. ? You smoke. ? You get migraines. ? You have  had a blood clot. What other changes can be made?  Manage your cholesterol. ? It is important to eat a healthy diet. ? If your cholesterol cannot be managed through your diet, you may also need to take medicines. Take medicines as told by your doctor.  Manage your diabetes. ? It is important to eat a healthy diet and to exercise regularly. ? If your blood sugar cannot be managed through diet and exercise, you may need to take medicines. Take medicines as told by your doctor.  Control your high blood pressure (hypertension). ? Try to keep your blood pressure below 130/80. This can help lower your risk of stroke. ? It is important to eat a healthy diet and to exercise regularly. ? If your blood pressure cannot be managed through diet and exercise, you may need to take medicines. Take medicines as told by your doctor. ? Ask your doctor if you should check your blood pressure at home. ? Have your blood pressure checked every year. Do this even if your blood pressure is normal.  Talk to your doctor about getting checked for a sleep disorder. Signs of this can include: ? Snoring a lot. ? Feeling very tired.  Take over-the-counter and prescription medicines only as told by your doctor. These may include aspirin or blood thinners (antiplatelets or anticoagulants).  Make sure that any other medical conditions you have are managed. Where to find more information  American Stroke Association: www.strokeassociation.org  National Stroke Association: www.stroke.org Get help right away if:  You have any symptoms of stroke. "BE FAST" is an easy way to remember the main warning signs: ? B - Balance. Signs are dizziness, sudden trouble walking, or loss of balance. ? E - Eyes. Signs are trouble seeing or a sudden change in how you see. ? F - Face. Signs are sudden weakness or loss of feeling of the face, or the face or eyelid drooping on one side. ? A - Arms. Signs are weakness or loss of feeling in  an arm. This happens suddenly and usually on one side of the body. ? S - Speech. Signs are sudden trouble speaking, slurred speech, or trouble understanding what people say. ? T - Time. Time to call emergency services. Write down what time symptoms started.  You have other signs of stroke, such as: ? A sudden, very bad headache with no known cause. ? Feeling sick to your stomach (nausea). ?  Throwing up (vomiting). ? Jerky movements you cannot control (seizure). These symptoms may represent a serious problem that is an emergency. Do not wait to see if the symptoms will go away. Get medical help right away. Call your local emergency services (911 in the [place] Do not drive yourself to the hospital. Summary  You can prevent a stroke by eating healthy, exercising, not smoking, drinking less alcohol, and treating other health problems, such as diabetes, high blood pressure, or high cholesterol.  Do not use any products that contain nicotine or tobacco, such as cigarettes and e-cigarettes.  Get help right away if you have any signs or symptoms of a stroke. This information is not intended to replace advice given to you by your health care provider. Make sure you discuss any questions you have with your health care provider. Document Revised: 07/15/2018 Document Reviewed: 08/20/2016 Elsevier Patient Education  [place].

## 2019-08-29 ENCOUNTER — Ambulatory Visit
Admission: RE | Admit: 2019-08-29 | Discharge: 2019-08-29 | Disposition: A | Discharge status: Home / Self Care | Payer: Medicare Other | Source: Ambulatory Visit | Attending: Obstetrics and Gynecology | Admitting: Obstetrics and Gynecology

## 2019-08-29 ENCOUNTER — Other Ambulatory Visit: Payer: Self-pay

## 2019-08-29 DIAGNOSIS — Z1231 Encounter for screening mammogram for malignant neoplasm of breast: Secondary | ICD-10-CM

## 2019-11-08 ENCOUNTER — Telehealth: Payer: 59 | Admitting: Family Medicine

## 2019-11-15 ENCOUNTER — Telehealth: Payer: 59 | Admitting: Family Medicine

## 2019-12-01 ENCOUNTER — Telehealth: Payer: Medicare Other | Admitting: Family Medicine

## 2020-01-17 DIAGNOSIS — D122 Benign neoplasm of ascending colon: Secondary | ICD-10-CM | POA: Diagnosis not present

## 2020-01-17 DIAGNOSIS — K9189 Other postprocedural complications and disorders of digestive system: Secondary | ICD-10-CM | POA: Diagnosis not present

## 2020-02-07 ENCOUNTER — Other Ambulatory Visit: Payer: Self-pay

## 2020-02-07 ENCOUNTER — Ambulatory Visit
Admission: RE | Admit: 2020-02-07 | Discharge: 2020-02-07 | Disposition: A | Discharge status: Home / Self Care | Payer: Medicare Other | Source: Ambulatory Visit | Attending: Obstetrics and Gynecology | Admitting: Obstetrics and Gynecology

## 2020-02-07 DIAGNOSIS — Z13820 Encounter for screening for osteoporosis: Secondary | ICD-10-CM

## 2020-02-16 ENCOUNTER — Encounter: Payer: Self-pay | Admitting: Family Medicine

## 2020-02-16 ENCOUNTER — Ambulatory Visit: Payer: Medicare Other | Admitting: Family Medicine

## 2020-02-16 VITALS — BP 124/74 | HR 85 | Ht 65.0 in | Wt 202.2 lb

## 2020-02-16 DIAGNOSIS — Z9989 Dependence on other enabling machines and devices: Secondary | ICD-10-CM

## 2020-02-16 DIAGNOSIS — R9082 White matter disease, unspecified: Secondary | ICD-10-CM | POA: Diagnosis not present

## 2020-02-16 DIAGNOSIS — G4733 Obstructive sleep apnea (adult) (pediatric): Secondary | ICD-10-CM | POA: Diagnosis not present

## 2020-02-16 NOTE — Patient Instructions (Signed)
Please continue using your CPAP regularly. While your insurance requires that you use CPAP at least 4 hours each night on 70% of the nights, I recommend, that you not skip any nights and use it throughout the night if you can. Getting used to CPAP and staying with the treatment long term does take time and patience and discipline. Untreated obstructive sleep apnea when it is moderate to severe can have an adverse impact on cardiovascular health and raise her risk for heart disease, arrhythmias, hypertension, congestive heart failure, stroke and diabetes. Untreated obstructive sleep apnea causes sleep disruption, nonrestorative sleep, and sleep deprivation. This can have an impact on your day to day functioning and cause daytime sleepiness and impairment of cognitive function, memory loss, mood disturbance, and problems focussing. Using CPAP regularly can improve these symptoms.  Keep working on compliance. Try to put a barrier between upper lip and mask. This may help with moisture  Follow up in 6 months  Sleep Apnea Sleep apnea affects breathing during sleep. It causes breathing to stop for a short time or to become shallow. It can also increase the risk of:  Heart attack.  Stroke.  Being very overweight (obese).  Diabetes.  Heart failure.  Irregular heartbeat. The goal of treatment is to help you breathe normally again. What are the causes? There are three kinds of sleep apnea:  Obstructive sleep apnea. This is caused by a blocked or collapsed airway.  Central sleep apnea. This happens when the brain does not send the right signals to the muscles that control breathing.  Mixed sleep apnea. This is a combination of obstructive and central sleep apnea. The most common cause of this condition is a collapsed or blocked airway. This can happen if:  Your throat muscles are too relaxed.  Your tongue and tonsils are too large.  You are overweight.  Your airway is too small. What  increases the risk?  Being overweight.  Smoking.  Having a small airway.  Being older.  Being female.  Drinking alcohol.  Taking medicines to calm yourself (sedatives or tranquilizers).  Having family members with the condition. What are the signs or symptoms?  Trouble staying asleep.  Being sleepy or tired during the day.  Getting angry a lot.  Loud snoring.  Headaches in the morning.  Not being able to focus your mind (concentrate).  Forgetting things.  Less interest in sex.  Mood swings.  Personality changes.  Feelings of sadness (depression).  Waking up a lot during the night to pee (urinate).  Dry mouth.  Sore throat. How is this diagnosed?  Your medical history.  A physical exam.  A test that is done when you are sleeping (sleep study). The test is most often done in a sleep lab but may also be done at home. How is this treated?   Sleeping on your side.  Using a medicine to get rid of mucus in your nose (decongestant).  Avoiding the use of alcohol, medicines to help you relax, or certain pain medicines (narcotics).  Losing weight, if needed.  Changing your diet.  Not smoking.  Using a machine to open your airway while you sleep, such as: ? An oral appliance. This is a mouthpiece that shifts your lower jaw forward. ? A CPAP device. This device blows air through a mask when you breathe out (exhale). ? An EPAP device. This has valves that you put in each nostril. ? A BPAP device. This device blows air through a mask when  you breathe in (inhale) and breathe out.  Having surgery if other treatments do not work. It is important to get treatment for sleep apnea. Without treatment, it can lead to:  High blood pressure.  Coronary artery disease.  In men, not being able to have an erection (impotence).  Reduced thinking ability. Follow these instructions at home: Lifestyle  Make changes that your doctor recommends.  Eat a healthy  diet.  Lose weight if needed.  Avoid alcohol, medicines to help you relax, and some pain medicines.  Do not use any products that contain nicotine or tobacco, such as cigarettes, e-cigarettes, and chewing tobacco. If you need help quitting, ask your doctor. General instructions  Take over-the-counter and prescription medicines only as told by your doctor.  If you were given a machine to use while you sleep, use it only as told by your doctor.  If you are having surgery, make sure to tell your doctor you have sleep apnea. You may need to bring your device with you.  Keep all follow-up visits as told by your doctor. This is important. Contact a doctor if:  The machine that you were given to use during sleep bothers you or does not seem to be working.  You do not get better.  You get worse. Get help right away if:  Your chest hurts.  You have trouble breathing in enough air.  You have an uncomfortable feeling in your back, arms, or stomach.  You have trouble talking.  One side of your body feels weak.  A part of your face is hanging down. These symptoms may be an emergency. Do not wait to see if the symptoms will go away. Get medical help right away. Call your local emergency services (911 in the [place] Do not drive yourself to the hospital. Summary  This condition affects breathing during sleep.  The most common cause is a collapsed or blocked airway.  The goal of treatment is to help you breathe normally while you sleep. This information is not intended to replace advice given to you by your health care provider. Make sure you discuss any questions you have with your health care provider. Document Revised: 03/05/2018 Document Reviewed: 01/12/2018 Elsevier Patient Education  [place].

## 2020-02-16 NOTE — Progress Notes (Signed)
PATIENT: [name] [name] DOB: Aug 02, 1955  REASON FOR VISIT: follow up HISTORY FROM: patient  Chief Complaint  Patient presents with  . Follow-up    6 month f/u for OSA.   Marland Kitchen room 1    alone      HISTORY OF PRESENT ILLNESS: Today 02/19/20 [name] [name] is a 64 y.o. female here today for follow up OSA on CPAP therapy.  She admits to continued difficulty with CPAP compliance.  There have been multiple factors contributing.  She reports being more compliant over the past 30 days.  She does recognize the importance of using CPAP therapy.  She does report improved sleep quality and daytime energy levels when using CPAP therapy.  She denies any difficulty with her CPAP or supplies.  She is motivated to continue using CPAP therapy.    Compliance report dated 01/16/2020 through 02/14/2020 reveals she used CPAP 17 of the past 30 days for compliance of 57%.  She used CPAP greater than 4 hours 17 of the past 30 days for compliance of 57%.  Average usage on days used was 6 hours and 58 minutes.  Residual AHI was 0.2 on 5 to 12 cm of water and EPR 3.  There was no significant leak noted.  HISTORY: (copied from my note on 08/10/2019)  [name] [name] is a 64 y.o. female here today for follow up post lacunar stroke in 2019 and OSA.  She reports that, overall, she is doing well.  She is followed closely by primary care.  She reports that blood pressures are typically well controlled.  She continues aspirin and atorvastatin for stroke prevention.  She reports that last A1c was 6.  No stroke symptoms since last being seen.  She admits that she continues to have difficulty with compliance using CPAP.  She feels like at the beginning of the year she was doing much better but then with stress from the pandemic and recently selling her home, she has fallen behind with compliance.  She does note benefit of using CPAP.  She is motivated to continue use.  Compliance report dated 07/10/2019 through  08/08/2019 reveals that she used CPAP 10 of the past 30 days for compliance of 33%.  She used CPAP greater than 4 hours 9 of the last 30 days for compliance of 30%.  Average usage on days used was 5 hours and 22 minutes.  Residual AHI was 0.1 on 5 to 12 cm of water and an EPR of 3.  There was no significant leak noted.  HISTORY: (copied from  note on 03/30/2018)  [id number] A Wilsonis a 63 y.o.femalepatient, seen herein a Rv 03-30-2018,  I have the pleasure of meeting Ms. [name] [name] today she had a home sleep test by apnea link on 14 December 2017 and had to my surprise a rather moderate to severe degree of obstructive sleep apnea at an AHI of 25.7, RDI of 27.6 indicating that additional moderate snoring was present she also had a long this oxygenation time 28 minutes of her 240 minutes of test time were with out sufficient oxygenation. The nadir for oxygen was 78%, but heart rate between 6160 bpm. I recommended that she should try CPAP for obstructive sleep apnea at this degree.   CPAP was initiated and from mid-September on the patient began to use it more compliantly.22 September to 22 October she used it for about 68% of the time with an average day user time of 6 hours 5 minutes,  the residual AHI is 0.1 the pressure at the 95th percentile is 10.3 and we had another download here today over the last 30 days and unfortunately he the compliance is only 60% but the AHI remains very low at 0.1/h the 95th percentile pressure is 10.5 cm water. I would like to increase the maximum pressure on her CPAP and urged her to use the machine each night for 4 hours minimum. She has used a nasal pillow dream wear- and cant sleep on her side as well.   CONSULT: [name] [name] reports that she feels usually refreshed and rested after sleep, but often sleeps only 2 hours of sleep at night. Her medical history includes hyperlipidemia, diabetes, hypertension, migraines, irritable bowel syndrome and a recent  MRI of the brain that showed small vessel ischemia and a remote lacunar infarct in the left thalamus. Overall it was unlikely that this gave her any symptoms of significance. She also has gained weight over the last 5 to 10 years. Was referred for healthy weight and wellness to Dr. Leafy Ro. Dr. Jaynee Eagles wanted her to be evaluated for the possible presence of sleep apnea considering her risk for recurrent strokes and TIAs. The patient continues on aspirin, blood pressure goal is to keep blood pressure below 130/90 mmHg, and her hemoglobin A1c below 6.5%.   Chief complaint according to patient : " Sometimes I just fall asleep, mostly I don't need a lot of sleep- I watch TV at night"   Sleep habits are as follows: "I am a night owl, I go to bed at 10 o'clock and I am asleep whenever. Soma and gabapentin help to fall asleep. I can go 3 nights with 6-8 hours of sleep, and others with 2-3 hours". Husband sleeps immediately when he lays down. The marital bedroom is cool (74 degrees is considered cool by her), with a ceiling fan. Sleeps on 2 pillows and on her side, still has hot flushes, still menopausal symptoms- 20 years after total hysterectomy and remains on HRT. The TV is running all night -and she wakes up when husband switches it off. 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 She reports not being angry, upset, anxious or under an unusual amount of stress. Rises at 5-6 AM. Husband stated she snores, as do their children, have noted irregular breathing. She doubts that. She has nocturia once each night. She sleeps in intervals of 2- 4 hours, and when waking up has no headaches, neither pain, but a dry mouth. She has this sleep pattern for a long time, many years.  She may spend a whole day in bed sleeping on and off after a day where she had to be on her feet a lot.   Sleep medical history and family sleep history: unaware of biological family history. Mother deceased.  The patient  reports that she has chronic back pain and has had multiple back surgeries, for total. She is seen Dr. Saintclair Halsted for this. She is not able to sit comfortably for longer than an hour or stand for longer than an hour. She tries to move around or lays down to relieve her back pain. She feels however that she does not need much sleep, and she watches TV at night in her bedroom. Her history of diabetes hypertension and hyperlipidemia has been reviewed above. Her current BMI is below 35.57.  Social history: 2 sons and one daughter, healthy- all adult. lives with husband, shares her bedroom. Disabled due to back problems, 2 years.  Worked in [employer], office job, sitting.  Non smoker, ETOH - once a month a drink. Caffeine; none.   Dr. Cathren Laine note from 09-11-2017 : JAIDYN USERY is a 64 y.o. female here as a referral from Dr. Criss Rosales for abnormal MRI. Past medical history hyperlipidemia, diabetes, HTN, migraine, IBS. Patient was having neck pain and had MRI cervical spine and saw something in the brain and a follow up was initiated. No difficulties with memory or balance, she has back pain and symptoms related to that but no significant balance issues or memory issues. No onset of symptoms, no acute onset of paresthesias however she does have problems in the legs due to back. She went due to problems in the arm, have been going on for the last 6 months, worsening with neck pain. Her grip on the right is worsening. She snores and stops breathing in her sleep.  History (copied from Dr Cathren Laine note on 03/15/2018)  Interval history: Here for follow up of stroke.This is a 64 year old patient who was referred for abnormal MRI. Past medical history includes hyperlipidemia, diabetes, hypertension, migraine, IBS. MRI of the brain showed chronic small vessel ischemia and a remote lacunar infarct in the left thalamus. Diagnosed with sleep apnea and is now using a cpap. She had moderately severe OSA AHI 26. She  does not know if she is feeling better. She has an appointment with Dr. Brett Fairy the end of the month. Otherwise doing well, no problems, just having neck issues and ongoing dry needling. Reviewed report, she is doing well > 70% using cpap >=4 hours.  NWG:[id number] A Wilsonis a 64 y.o.femalehere as a referral from Dr. Mickeal Needy abnormal MRI. Past medical history hyperlipidemia, diabetes, HTN, migraine, IBS. Patient was having neck pain and had mri cervical and saw something in the brain and a follow up was initiated. No difficulties with memory or balance, she has back pain and symptoms related to that but no significant balance issues or memory issues. No onset of symptoms, no acute onset of paresthesias however she does have problems in the legs due to back. She went due to problems in the arm, have been going on for the last 6 months, worsening with neck pain. Her grip on the right is worsening. She snores and stops breathing in her sleep.  Reviewed notes, labs and imaging from outside physicians, which showed:  Reviewed referring physician notes for lesion of the brainstem. MRI of the brain was performed by [facility]. Overall she remains asymptomatic. She has chronic neck and back pain and she is doing physical therapy and working with pain management. Reviewed thorough complete neurologic exam which was nonfocal. MRI of the brain shows increased T2 signal in her brainstem with what appears to be expansion of the pons and small vessel ischemic changes throughout the white matter cerebral hemispheres.  Personally reviewed MRI brain images and agree with the following:  IMPRESSION: Pontine signal abnormality on previous cervical MRI is most likely chronic small vessel ischemia, which is also seen to a moderate degree in the cerebral white matter. There is remote lacunar infarct in the left thalamus. If there are bulbar symptoms a follow-up could be obtained to document  stability.  hgba1c 6 2016   REVIEW OF SYSTEMS: Out of a complete 14 system review of symptoms, the patient complains only of the following symptoms, chronic pain and all other reviewed systems are negative.  ESS: 2 FSS: 11  ALLERGIES: Allergies  Allergen Reactions  . Adhesive [Tape] Dermatitis  .  Latex Dermatitis  . Codeine Nausea And Vomiting  . Erythromycin Itching and Swelling  . Amoxicillin Diarrhea  . Azithromycin   . Macrolides And Ketolides   . Other     FOOD ALLERGIES MSG  Green peas Onions cucumber Mustard Peanuts Corn sesame Yellow onions lettuce RED DYE NO PERFUMES  . Peanut-Containing Drug Products   . Potassium Clavulanate [Clavulanic Acid]   . Augmentin [Amoxicillin-Pot Clavulanate] Diarrhea    Per discussion 07/26/2012, pt tolerates Omnicef well.  . Iodinated Diagnostic Agents Itching    Tolerates iodinated contrast with Benadryl 50mg  PO prior to administration.  jkl    HOME MEDICATIONS: Outpatient Medications Prior to Visit  Medication Sig Dispense Refill  . amLODipine (NORVASC) 5 MG tablet Take 5 mg by mouth daily.    Marland Kitchen aspirin EC 325 MG tablet Take 1 tablet (325 mg total) by mouth daily. 30 tablet 0  . atorvastatin (LIPITOR) 40 MG tablet Take 40 mg by mouth daily.    . cholecalciferol (VITAMIN D3) 25 MCG (1000 UT) tablet Take 2,000 Units by mouth daily.    . cyclobenzaprine (FLEXERIL) 5 MG tablet Take 5 mg by mouth 3 (three) times daily as needed for muscle spasms.    Marland Kitchen EPINEPHRINE, ANAPHYLAXIS THERAPY AGENTS, Inject 0.3 mg into the muscle.    . estradiol (ESTRACE) 1 MG tablet Take 1 mg by mouth daily.    . fenofibrate (TRICOR) 145 MG tablet Take 145 mg by mouth daily.    . fluticasone (FLONASE) 50 MCG/ACT nasal spray Place 1 spray into both nostrils daily.    Marland Kitchen ibuprofen (ADVIL,MOTRIN) 800 MG tablet Take 800 mg by mouth every 8 (eight) hours as needed for moderate pain.    Marland Kitchen latanoprost (XALATAN) 0.005 % ophthalmic solution Place 1 drop into  both eyes at bedtime.    . montelukast (SINGULAIR) 10 MG tablet Take 10 mg by mouth at bedtime.    . potassium chloride SA (K-DUR,KLOR-CON) 20 MEQ tablet Take 20 mEq by mouth daily.    . psyllium (METAMUCIL) 58.6 % packet Take 1 packet by mouth daily.    . SitaGLIPtin-MetFORMIN HCl [id number] MG TB24 Take 1 tablet by mouth at bedtime.    . valsartan-hydrochlorothiazide (DIOVAN-HCT) 160-12.5 MG tablet Take 1 tablet by mouth daily.    . Amlodipine-Valsartan-HCTZ 5-160-12.5 MG TABS Take 1 tablet by mouth at bedtime.     No facility-administered medications prior to visit.    PAST MEDICAL HISTORY: Past Medical History:  Diagnosis Date  . Angiomyolipoma of left kidney   . Arthritis    left foot  . Bronchitis   . Complication of anesthesia    Patient woke up during bunionectomy and breast cyst removal surgery  . Diabetes mellitus without complication ([place])   . Environmental and seasonal allergies   . GERD (gastroesophageal reflux disease)   . Glaucoma    Bilateral  . Hyperlipemia   . Hypertension   . Lower back pain   . Mini stroke ([place])   . Multiple food allergies    MSG, green peas, onions, cucumber, mustard, peanuts, corn, sesame, lettuce, red dye, perfumes  . Optic nerve disorder    left eye  . Pneumonia 2014  . PONV (postoperative nausea and vomiting)    Slight nausea  . Prediabetes   . Rheumatoid arthritis ([place])   . Sleep apnea   . Vitamin D deficiency     PAST SURGICAL HISTORY: Past Surgical History:  Procedure Laterality Date  . ABDOMINAL HYSTERECTOMY    .  BACK SURGERY     X 4  . BREAST CYST EXCISION Bilateral    20+ years ago  . BUNIONECTOMY Bilateral   . CATARACT EXTRACTION    . CATARACT EXTRACTION, BILATERAL Bilateral 2019   with lens implant; Dr. Bing Plume. June 2019 Left eye; July 2019 Right eye.  Marland Kitchen COLONOSCOPY    . EYE SURGERY    . FINGER SURGERY Left    Thumb    FAMILY HISTORY: Family History  Adopted: Yes  Problem Relation Age of Onset  .  Hyperlipidemia Mother   . Hypertension Mother   . Colon cancer Neg Hx   . Stomach cancer Neg Hx   . Esophageal cancer Neg Hx     SOCIAL HISTORY: Social History   Socioeconomic History  . Marital status: Married    Spouse name: Kamara Allan  . Number of children: 2  . Years of education: 14+  . Highest education level: Some college, no degree  Occupational History  . Occupation: Retired   Tobacco Use  . Smoking status: Never Smoker  . Smokeless tobacco: Never Used  Vaping Use  . Vaping Use: Never used  Substance and Sexual Activity  . Alcohol use: Yes    Alcohol/week: 0.0 standard drinks    Comment: once a year  . Drug use: No  . Sexual activity: Not on file  Other Topics Concern  . Not on file  Social History Narrative   Married. She has 2 daughters. She works in Press photographer. No caffeine- allergic    Right handed         Social Determinants of Health   Financial Resource Strain:   . Difficulty of Paying Living Expenses: Not on file  Food Insecurity:   . Worried About Charity fundraiser in the Last Year: Not on file  . Ran Out of Food in the Last Year: Not on file  Transportation Needs:   . Lack of Transportation (Medical): Not on file  . Lack of Transportation (Non-Medical): Not on file  Physical Activity:   . Days of Exercise per Week: Not on file  . Minutes of Exercise per Session: Not on file  Stress:   . Feeling of Stress : Not on file  Social Connections:   . Frequency of Communication with Friends and Family: Not on file  . Frequency of Social Gatherings with Friends and Family: Not on file  . Attends Religious Services: Not on file  . Active Member of Clubs or Organizations: Not on file  . Attends Archivist Meetings: Not on file  . Marital Status: Not on file  Intimate Partner Violence:   . Fear of Current or Ex-Partner: Not on file  . Emotionally Abused: Not on file  . Physically Abused: Not on file  . Sexually Abused: Not on file       PHYSICAL EXAM  Vitals:   02/16/20 1044  BP: 124/74  Pulse: 85  Weight: 202 lb 3.2 oz (91.7 kg)  Height: 5\' 5"  (1.651 m)   Body mass index is 33.65 kg/m.  Generalized: Well developed, in no acute distress  Cardiology: normal rate and rhythm, no murmur noted Respiratory: clear to auscultation bilaterally  Neurological examination  Mentation: Alert oriented to time, place, history taking. Follows all commands speech and language fluent Cranial nerve II-XII: Pupils were equal round reactive to light. Extraocular movements were full, visual field were full  Motor: The motor testing reveals 5 over 5 strength of all 4 extremities. Good  symmetric motor tone is noted throughout.  Gait and station: Gait is normal.    DIAGNOSTIC DATA (LABS, IMAGING, TESTING) - I reviewed patient records, labs, notes, testing and imaging myself where available.  No flowsheet data found.   Lab Results  Component Value Date   WBC 9.0 04/06/2018   HGB 12.5 04/06/2018   HCT 39.0 04/06/2018   MCV 95 04/06/2018   PLT 390.0 12/09/2017      Component Value Date/Time   NA 139 07/21/2018 0827   K 4.3 07/21/2018 0827   CL 103 07/21/2018 0827   CO2 21 07/21/2018 0827   GLUCOSE 87 07/21/2018 0827   GLUCOSE 96 12/09/2017 1020   BUN 13 07/21/2018 0827   CREATININE 1.00 07/21/2018 0827   CALCIUM 9.9 07/21/2018 0827   PROT 7.4 07/21/2018 0827   ALBUMIN 4.2 07/21/2018 0827   AST 17 07/21/2018 0827   ALT 20 07/21/2018 0827   ALKPHOS 28 (L) 07/21/2018 0827   BILITOT 0.3 07/21/2018 0827   GFRNONAA 60 07/21/2018 0827   GFRAA 69 07/21/2018 0827   Lab Results  Component Value Date   CHOL 140 07/21/2018   HDL 38 (L) 07/21/2018   LDLCALC 74 07/21/2018   TRIG 138 07/21/2018   Lab Results  Component Value Date   HGBA1C 5.8 (H) 07/21/2018   Lab Results  Component Value Date   VITAMINB12 392 04/06/2018   Lab Results  Component Value Date   TSH 1.820 04/06/2018       ASSESSMENT AND  PLAN 64 y.o. year old female  has a past medical history of Angiomyolipoma of left kidney, Arthritis, Bronchitis, Complication of anesthesia, Diabetes mellitus without complication ([place]), Environmental and seasonal allergies, GERD (gastroesophageal reflux disease), Glaucoma, Hyperlipemia, Hypertension, Lower back pain, Mini stroke ([place]), Multiple food allergies, Optic nerve disorder, Pneumonia (2014), PONV (postoperative nausea and vomiting), Prediabetes, Rheumatoid arthritis ([place]), Sleep apnea, and Vitamin D deficiency. here with     ICD-10-CM   1. OSA on CPAP  G47.33    Z99.89   2. White matter abnormality on MRI of brain  R90.82      Anber admits that she continues to have intermittent difficulty with compliance.  She does recognize that the importance of using CPAP therapy.  She was encouraged to continue using CPAP nightly and for greater than 4 hours each night.  I have encouraged her to continue close follow-up with primary care.  Strict precautions reviewed.  She will continue to follow-up with Dr. Saintclair Halsted as indicated for neck pain.  She will return to see me in 6 months, sooner if needed.  She verbalizes understanding and agreement with this plan.  No orders of the defined types were placed in this encounter.    No orders of the defined types were placed in this encounter.     I spent 15 minutes with the patient. 50% of this time was spent counseling and educating patient on plan of care and medications.    Debbora Presto, FNP-C 02/19/2020, 1:23 PM [facility] [address], [place], [place] [address] [phone]

## 2020-02-19 ENCOUNTER — Encounter: Payer: Self-pay | Admitting: Family Medicine

## 2020-08-15 ENCOUNTER — Ambulatory Visit: Payer: Medicare Other | Admitting: Family Medicine

## 2020-09-10 ENCOUNTER — Other Ambulatory Visit: Payer: Self-pay

## 2020-09-10 DIAGNOSIS — Z1231 Encounter for screening mammogram for malignant neoplasm of breast: Secondary | ICD-10-CM

## 2020-11-28 NOTE — Progress Notes (Signed)
PATIENT: [name] [name] DOB: [birth year]  REASON FOR VISIT: follow up HISTORY FROM: patient  Chief Complaint  Patient presents with   Obstructive Sleep Apnea    Rm 2, alone. Here for cpap f/u, pt would like to restart cpap. C/o of HA maybe due to stress, ongoing for a few months, come and go per pt. Takes tylenol.       HISTORY OF PRESENT ILLNESS: 11/28/2020 ALL: [name] [name] returns for follow up for OSA. She has not used CPAP, recently. She reports several life changes with a move to [place] have contributed to discontinuation of CPAP. She is not feeling as well as she was. She is not sleeping as well. She has more stress with life changes. She is having more headaches. She describes sharp pains at various locations all over her head. Headaches wax and wane. No pattern or clear periods of time when headaches resolve. Headache can last hours or days. She usually takes Tylenol that helps. She has had similar headaches for years. She has a history of microvascular disease and was concerned she may need another MRI. She does recognize need for sleep apnea management. BP is usually 120's/70's. She feels it is elevated today due to rushing to get to her appt and having [employer] for breakfast. She is taking blood pressure medications and followed by PCP regularly.   02/16/2020 ALL:  [name] [name] is a 65 y.o. female here today for follow up OSA on CPAP therapy.  She admits to continued difficulty with CPAP compliance.  There have been multiple factors contributing.  She reports being more compliant over the past 30 days.  She does recognize the importance of using CPAP therapy.  She does report improved sleep quality and daytime energy levels when using CPAP therapy.  She denies any difficulty with her CPAP or supplies.  She is motivated to continue using CPAP therapy.    Compliance report dated 01/16/2020 through 02/14/2020 reveals she used CPAP 17 of the past 30 days for compliance  of 57%.  She used CPAP greater than 4 hours 17 of the past 30 days for compliance of 57%.  Average usage on days used was 6 hours and 58 minutes.  Residual AHI was 0.2 on 5 to 12 cm of water and EPR 3.  There was no significant leak noted.  HISTORY: (copied from my note on 08/10/2019)  [name] [name] is a 65 y.o. female here today for follow up post lacunar stroke in 2019 and OSA.  She reports that, overall, she is doing well.  She is followed closely by primary care.  She reports that blood pressures are typically well controlled.  She continues aspirin and atorvastatin for stroke prevention.  She reports that last A1c was 6.  No stroke symptoms since last being seen.   She admits that she continues to have difficulty with compliance using CPAP.  She feels like at the beginning of the year she was doing much better but then with stress from the pandemic and recently selling her home, she has fallen behind with compliance.  She does note benefit of using CPAP.  She is motivated to continue use.   Compliance report dated 07/10/2019 through 08/08/2019 reveals that she used CPAP 10 of the past 30 days for compliance of 33%.  She used CPAP greater than 4 hours 9 of the last 30 days for compliance of 30%.  Average usage on days used was 5 hours and 22 minutes.  Residual AHI was 0.1 on 5 to 12 cm of water and an EPR of 3.  There was no significant leak noted.   HISTORY: (copied from  note on 03/30/2018)   HPI:  [name] [name] is a 65 y.o. female patient, seen here in a Rv 03-30-2018,   I have the pleasure of meeting [name] [name] today she had a home sleep test by apnea link on 14 December 2017 and had to my surprise a rather moderate to severe degree of obstructive sleep apnea at an AHI of 25.7, RDI of 27.6 indicating that additional moderate snoring was present she also had a long this oxygenation time 28 minutes of her 240 minutes of test time were with out sufficient oxygenation.  The nadir for oxygen was  78%, but heart rate between 6160 bpm.  I recommended that she should try CPAP for obstructive sleep apnea at this degree.     CPAP was initiated and from mid-September on the patient began to use it more compliantly.  73 September to  22 October she used it for about 68% of the time with an average day user time of 6 hours 5 minutes, the residual AHI is 0.1 the pressure at the 95th percentile is 10.3 and we had another download here today over the last 30 days and unfortunately he the compliance is only 60% but the AHI remains very low at 0.1/h the 95th percentile pressure is 10.5 cm water.  I would like to increase the maximum pressure on her CPAP and urged her to use the machine each night for 4 hours minimum. She has used a nasal pillow dream wear- and cant sleep on her side as well.    CONSULT:  [name] [name] reports that she feels usually refreshed and rested after sleep, but often sleeps only 2 hours of sleep at night.  Her medical history includes hyperlipidemia, diabetes, hypertension, migraines, irritable bowel syndrome and a recent MRI of the brain that showed small vessel ischemia and a remote lacunar infarct in the left thalamus.  Overall it was unlikely that this gave her any symptoms of significance.  She also has gained weight over the last 5 to 10 years.  Was referred for healthy weight and wellness to [name] [name].  [name] [name] wanted her to be evaluated for the possible presence of sleep apnea considering her risk for recurrent strokes and TIAs.  The patient continues on aspirin, blood pressure goal is to keep blood pressure below 130/90 mmHg, and her hemoglobin A1c below 6.5%.   Chief complaint according to patient : " Sometimes I just fall asleep, mostly I don't need a lot of sleep- I watch TV at night"    Sleep habits are as follows: "I am a night owl, I go to bed at 10 o'clock and I am asleep whenever.  Soma and gabapentin help to fall asleep. I can go 3 nights with 6-8 hours of sleep, and  others with 2-3 hours". Husband sleeps immediately when he lays down. The marital  bedroom is cool (74 degrees is considered cool by her), with a ceiling fan.  Sleeps on 2 pillows and on her side, still has hot flushes, still menopausal symptoms- 20 years after total hysterectomy and remains on HRT. The TV is running all night -and she wakes up when husband switches it off.  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 She reports not being angry, upset, anxious or under an unusual amount of stress.  Rises at 5-6 AM. Husband stated she snores, as do their children, have noted irregular breathing. She doubts that. She has nocturia once each night. She sleeps in intervals of 2- 4 hours, and when waking up has no headaches, neither pain, but a dry mouth. She has this sleep pattern for a long time, many years.  She may spend a whole day in bed sleeping on and off after a day where she had to be on her feet a lot.   Sleep medical history and family sleep history: unaware of biological family history. Mother deceased.  The patient reports that she has chronic back pain and has had multiple back surgeries, for total.  She is seen Dr. Saintclair Halsted for this.  She is not able to sit comfortably for longer than an hour or stand for longer than an hour.  She tries to move around or lays down to relieve her back pain.  She feels however that she does not need much sleep, and she watches TV at night in her bedroom.  Her history of diabetes hypertension and hyperlipidemia has been reviewed above.  Her current BMI is below 35.57.   Social history: 2 sons and one daughter, healthy- all adult.  lives with husband, shares her bedroom. Disabled due to back problems, 2 years. Worked in [employer], office job, sitting.  Non smoker, ETOH - once a month a drink. Caffeine; none.    Dr. Cathren Laine note from 09-11-2017 : SUHANA WILNER is a 65 y.o. female here as a referral from Dr. Criss Rosales for abnormal MRI.  Past medical history  hyperlipidemia, diabetes, HTN, migraine, IBS. Patient was having neck pain and had MRI cervical spine  and saw something in the brain and a follow up was initiated. No difficulties with memory or balance, she has back pain and symptoms related to that but no significant balance issues or memory issues. No onset of symptoms, no acute onset of paresthesias however she does have problems in the legs due to back. She went due to problems in the arm, have been going on for the last 6 months, worsening with neck pain. Her grip on the right is worsening. She snores and stops breathing in her sleep.   History (copied from Dr Cathren Laine note on 03/15/2018)   Interval history: Here for follow up of stroke. This is a 65 year old patient who was referred for abnormal MRI.  Past medical history includes hyperlipidemia, diabetes, hypertension, migraine, IBS.  MRI of the brain showed chronic small vessel ischemia and a remote lacunar infarct in the left thalamus. Diagnosed with sleep apnea and is now using a cpap. She had moderately severe OSA AHI 26. She does not know if she is feeling better. She has an appointment with Dr. Brett Fairy the end of the month. Otherwise doing well, no problems, just having neck issues and ongoing dry needling. Reviewed report, she is doing well > 70% using cpap >=4 hours.    HPI:  SHAMIRACLE GORDEN is a 65 y.o. female here as a referral from Dr. Domingo Madeira for abnormal MRI.  Past medical history hyperlipidemia, diabetes, HTN, migraine, IBS. Patient was having neck pain and had mri cervical and saw something in the brain and a follow up was initiated. No difficulties with memory or balance, she has back pain and symptoms related to that but no significant balance issues or memory issues. No onset of symptoms, no acute onset of paresthesias however she does have problems in the legs due to back.  She went due to problems in the arm, have been going on for the last 6 months, worsening with neck pain. Her  grip on the right is worsening. She snores and stops breathing in her sleep.   Reviewed notes, labs and imaging from outside physicians, which showed:   Reviewed referring physician notes for lesion of the brainstem.  MRI of the brain was performed by [facility].  Overall she remains asymptomatic.  She has chronic neck and back pain and she is doing physical therapy and working with pain management.  Reviewed thorough complete neurologic exam which was nonfocal.  MRI of the brain shows increased T2 signal in her brainstem with what appears to be expansion of the pons and small vessel ischemic changes throughout the white matter cerebral hemispheres.   Personally reviewed MRI brain images and agree with the following:   IMPRESSION: Pontine signal abnormality on previous cervical MRI is most likely chronic small vessel ischemia, which is also seen to a moderate degree in the cerebral white matter. There is remote lacunar infarct in the left thalamus. If there are bulbar symptoms a follow-up could be obtained to document stability.   hgba1c 6 2016    REVIEW OF SYSTEMS: Out of a complete 14 system review of symptoms, the patient complains only of the following symptoms, chronic pain and all other reviewed systems are negative.  ESS: 2 FSS: 11  ALLERGIES: Allergies  Allergen Reactions   Adhesive [Tape] Dermatitis   Latex Dermatitis   Codeine Nausea And Vomiting   Erythromycin Itching and Swelling   Amoxicillin Diarrhea   Azithromycin    Macrolides And Ketolides    Other     FOOD ALLERGIES MSG  Green peas Onions cucumber Mustard Peanuts Corn sesame Yellow onions lettuce RED DYE NO PERFUMES   Peanut-Containing Drug Products    Potassium Clavulanate [Clavulanic Acid]    Augmentin [Amoxicillin-Pot Clavulanate] Diarrhea    Per discussion 07/26/2012, pt tolerates Omnicef well.   Iodinated Diagnostic Agents Itching    Tolerates iodinated contrast with Benadryl 50mg   PO prior to administration.  jkl    HOME MEDICATIONS: Outpatient Medications Prior to Visit  Medication Sig Dispense Refill   amLODipine (NORVASC) 5 MG tablet Take 5 mg by mouth daily.     aspirin EC 325 MG tablet Take 1 tablet (325 mg total) by mouth daily. 30 tablet 0   atorvastatin (LIPITOR) 40 MG tablet Take 40 mg by mouth daily.     cholecalciferol (VITAMIN D3) 25 MCG (1000 UT) tablet Take 2,000 Units by mouth daily.     cyclobenzaprine (FLEXERIL) 5 MG tablet Take 5 mg by mouth 3 (three) times daily as needed for muscle spasms.     EPINEPHRINE, ANAPHYLAXIS THERAPY AGENTS, Inject 0.3 mg into the muscle.     estradiol (ESTRACE) 1 MG tablet Take 1 mg by mouth daily.     fenofibrate (TRICOR) 145 MG tablet Take 145 mg by mouth daily.     fluticasone (FLONASE) 50 MCG/ACT nasal spray Place 1 spray into both nostrils daily.     ibuprofen (ADVIL,MOTRIN) 800 MG tablet Take 800 mg by mouth every 8 (eight) hours as needed for moderate pain.     latanoprost (XALATAN) 0.005 % ophthalmic solution Place 1 drop into both eyes at bedtime.     montelukast (SINGULAIR) 10 MG tablet Take 10 mg by mouth at bedtime.     potassium chloride SA (K-DUR,KLOR-CON) 20 MEQ tablet Take 20 mEq by mouth daily.  SitaGLIPtin-MetFORMIN HCl [phone] MG TB24 Take 1 tablet by mouth at bedtime.     valsartan-hydrochlorothiazide (DIOVAN-HCT) 160-12.5 MG tablet Take 1 tablet by mouth daily.     psyllium (METAMUCIL) 58.6 % packet Take 1 packet by mouth daily.     No facility-administered medications prior to visit.    PAST MEDICAL HISTORY: Past Medical History:  Diagnosis Date   Angiomyolipoma of left kidney    Arthritis    left foot   Bronchitis    Complication of anesthesia    Patient woke up during bunionectomy and breast cyst removal surgery   Diabetes mellitus without complication ([place])    Environmental and seasonal allergies    GERD (gastroesophageal reflux disease)    Glaucoma    Bilateral   Hyperlipemia     Hypertension    Lower back pain    Mini stroke ([facility])    Multiple food allergies    MSG, green peas, onions, cucumber, mustard, peanuts, corn, sesame, lettuce, red dye, perfumes   Optic nerve disorder    left eye   Pneumonia 2014   PONV (postoperative nausea and vomiting)    Slight nausea   Prediabetes    Rheumatoid arthritis ([place])    Sleep apnea    Vitamin D deficiency     PAST SURGICAL HISTORY: Past Surgical History:  Procedure Laterality Date   ABDOMINAL HYSTERECTOMY     BACK SURGERY     X 4   BREAST CYST EXCISION Bilateral    20+ years ago   BUNIONECTOMY Bilateral    CATARACT EXTRACTION     CATARACT EXTRACTION, BILATERAL Bilateral 2019   with lens implant; Dr. Bing Plume. June 2019 Left eye; July 2019 Right eye.   COLONOSCOPY     EYE SURGERY     FINGER SURGERY Left    Thumb    FAMILY HISTORY: Family History  Adopted: Yes  Problem Relation Age of Onset   Hyperlipidemia Mother    Hypertension Mother    Colon cancer Neg Hx    Stomach cancer Neg Hx    Esophageal cancer Neg Hx     SOCIAL HISTORY: Social History   Socioeconomic History   Marital status: Married    Spouse name: [name] [name]   Number of children: 2   Years of education: 14+   Highest education level: Some college, no degree  Occupational History   Occupation: Retired   Tobacco Use   Smoking status: Never   Smokeless tobacco: Never  Vaping Use   Vaping Use: Never used  Substance and Sexual Activity   Alcohol use: Yes    Alcohol/week: 0.0 standard drinks    Comment: once a year   Drug use: No   Sexual activity: Not on file  Other Topics Concern   Not on file  Social History Narrative   Married. She has 2 daughters. She works in Press photographer. No caffeine- allergic    Right handed         Social Determinants of Health   Financial Resource Strain: Not on file  Food Insecurity: Not on file  Transportation Needs: Not on file  Physical Activity: Not on file  Stress: Not on file   Social Connections: Not on file  Intimate Partner Violence: Not on file      PHYSICAL EXAM  Vitals:   11/29/20 1300  BP: (!) 149/77  Pulse: 97  Weight: 203 lb 12.8 oz (92.4 kg)  Height: 5\' 5"  (1.651 m)    Body mass index is  33.91 kg/m.  Generalized: Well developed, in no acute distress  Cardiology: normal rate and rhythm, no murmur noted Respiratory: clear to auscultation bilaterally  Neurological examination  Mentation: Alert oriented to time, place, history taking. Follows all commands speech and language fluent Cranial nerve II-XII: Pupils were equal round reactive to light. Extraocular movements were full, visual field were full  Motor: The motor testing reveals 5 over 5 strength of all 4 extremities. Good symmetric motor tone is noted throughout.  Gait and station: Gait is normal.    DIAGNOSTIC DATA (LABS, IMAGING, TESTING) - I reviewed patient records, labs, notes, testing and imaging myself where available.  No flowsheet data found.   Lab Results  Component Value Date   WBC 9.0 04/06/2018   HGB 12.5 04/06/2018   HCT 39.0 04/06/2018   MCV 95 04/06/2018   PLT 390.0 12/09/2017      Component Value Date/Time   NA 139 07/21/2018 0827   K 4.3 07/21/2018 0827   CL 103 07/21/2018 0827   CO2 21 07/21/2018 0827   GLUCOSE 87 07/21/2018 0827   GLUCOSE 96 12/09/2017 1020   BUN 13 07/21/2018 0827   CREATININE 1.00 07/21/2018 0827   CALCIUM 9.9 07/21/2018 0827   PROT 7.4 07/21/2018 0827   ALBUMIN 4.2 07/21/2018 0827   AST 17 07/21/2018 0827   ALT 20 07/21/2018 0827   ALKPHOS 28 (L) 07/21/2018 0827   BILITOT 0.3 07/21/2018 0827   GFRNONAA 60 07/21/2018 0827   GFRAA 69 07/21/2018 0827   Lab Results  Component Value Date   CHOL 140 07/21/2018   HDL 38 (L) 07/21/2018   LDLCALC 74 07/21/2018   TRIG 138 07/21/2018   Lab Results  Component Value Date   HGBA1C 5.8 (H) 07/21/2018   Lab Results  Component Value Date   VITAMINB12 392 04/06/2018   Lab  Results  Component Value Date   TSH 1.820 04/06/2018       ASSESSMENT AND PLAN 65 y.o. year old female  has a past medical history of Angiomyolipoma of left kidney, Arthritis, Bronchitis, Complication of anesthesia, Diabetes mellitus without complication ([place]), Environmental and seasonal allergies, GERD (gastroesophageal reflux disease), Glaucoma, Hyperlipemia, Hypertension, Lower back pain, Mini stroke ([place]), Multiple food allergies, Optic nerve disorder, Pneumonia (2014), PONV (postoperative nausea and vomiting), Prediabetes, Rheumatoid arthritis ([place]), Sleep apnea, and Vitamin D deficiency. here with     ICD-10-CM   1. OSA on CPAP  G47.33    Z99.89         Janayla has not used CPAP in recent months. She has several life changes that have contributed to discontinuation and more stress. She is having more headaches as a result. She does recognize that the importance of using CPAP therapy.  She was encouraged to continue using CPAP nightly and for greater than 4 hours each night.  I have encouraged her to continue close follow-up with primary care. We have reviewed MRI results and I have educated her on how to prevent worsening of microvascular disease with co morbidity management and healthy lifestyle habits. She may consider valerian root or melatonin for help with sleep. Education material provided. She will return to see me in 3-6 months, sooner if needed.  She verbalizes understanding and agreement with this plan.   No orders of the defined types were placed in this encounter.    No orders of the defined types were placed in this encounter.     Debbora Presto, FNP-C 11/29/2020, 1:13 PM [facility]  [address] [place], [place] [address] [phone]

## 2020-11-28 NOTE — Patient Instructions (Addendum)
Please continue using your CPAP regularly. While your insurance requires that you use CPAP at least 4 hours each night on 70% of the nights, I recommend, that you not skip any nights and use it throughout the night if you can. Getting used to CPAP and staying with the treatment long term does take time and patience and discipline. Untreated obstructive sleep apnea when it is moderate to severe can have an adverse impact on cardiovascular health and raise her risk for heart disease, arrhythmias, hypertension, congestive heart failure, stroke and diabetes. Untreated obstructive sleep apnea causes sleep disruption, nonrestorative sleep, and sleep deprivation. This can have an impact on your day to day functioning and cause daytime sleepiness and impairment of cognitive function, memory loss, mood disturbance, and problems focussing. Using CPAP regularly can improve these symptoms.  Below is our plan:  We will help get you back on CPAP therapy. I really think this will help with your sleep and headaches. Please keep a close eye on your blood pressure readings. Continue working on healthy lifestyle habits. Consider valerian root 400-600mg  at night. You could also consider melatonin 5mg  at bedtime.   Please make sure you are staying well hydrated. I recommend 50-60 ounces daily. Well balanced diet and regular exercise encouraged. Consistent sleep schedule with 6-8 hours recommended.   Please continue follow up with care team as directed.   Follow up with me in 3-4 months   You may receive a survey regarding today's visit. I encourage you to leave honest feed back as I do use this information to improve patient care. Thank you for seeing me today!

## 2020-11-29 ENCOUNTER — Other Ambulatory Visit: Payer: Self-pay

## 2020-11-29 ENCOUNTER — Encounter: Payer: Self-pay | Admitting: Family Medicine

## 2020-11-29 ENCOUNTER — Ambulatory Visit
Admission: RE | Admit: 2020-11-29 | Discharge: 2020-11-29 | Disposition: A | Discharge status: Home / Self Care | Payer: Medicare Other | Source: Ambulatory Visit | Attending: Family Medicine | Admitting: Family Medicine

## 2020-11-29 ENCOUNTER — Ambulatory Visit: Payer: Medicare Other | Admitting: Family Medicine

## 2020-11-29 VITALS — BP 149/77 | HR 97 | Ht 65.0 in | Wt 203.8 lb

## 2020-11-29 DIAGNOSIS — Z1231 Encounter for screening mammogram for malignant neoplasm of breast: Secondary | ICD-10-CM

## 2020-11-29 DIAGNOSIS — G4733 Obstructive sleep apnea (adult) (pediatric): Secondary | ICD-10-CM

## 2020-11-29 DIAGNOSIS — Z9989 Dependence on other enabling machines and devices: Secondary | ICD-10-CM

## 2020-11-29 NOTE — Progress Notes (Signed)
CM sent to [facility] 

## 2020-11-30 NOTE — Progress Notes (Signed)
  Dr. Jaynee Eagles was her primary neurologist, and referred to sleep med- under my care for sleep, [name], MD . She wanted her to be evaluated for the possible presence of sleep apnea considering her risk for recurrent strokes and TIAs. She tested positive for sleep apnea but has not been in compliance with CPAP therapy.The patient has not been using her CPAP for how long?  Needs to have possibly a new HST in order to allow supplies to be re-ordered.  This patient was referred for healthy weight and wellness to Dr. Leafy Ro.    Amy, I agree with the assessment and plan as directed by you as treating NP on this visit .  I was available for consultation.  Please sent a cc to Dr Jaynee Eagles and possibly Dr Migdalia Dk group ,too.    Larey Seat, MD

## 2020-12-04 ENCOUNTER — Telehealth: Payer: Self-pay

## 2020-12-04 NOTE — Telephone Encounter (Signed)
Called and spoke with Shirlean Mylar today in medical records regarding updating system to send report to Dr. Glendon Axe instead of me.

## 2021-03-21 ENCOUNTER — Ambulatory Visit: Payer: Medicare Other | Admitting: Family Medicine

## 2021-03-21 ENCOUNTER — Other Ambulatory Visit: Payer: Self-pay

## 2021-03-21 ENCOUNTER — Encounter: Payer: Self-pay | Admitting: Family Medicine

## 2021-03-21 VITALS — BP 148/71 | HR 96 | Ht 66.0 in | Wt 202.6 lb

## 2021-03-21 DIAGNOSIS — Z9989 Dependence on other enabling machines and devices: Secondary | ICD-10-CM

## 2021-03-21 DIAGNOSIS — G4733 Obstructive sleep apnea (adult) (pediatric): Secondary | ICD-10-CM | POA: Diagnosis not present

## 2021-03-21 NOTE — Progress Notes (Signed)
PATIENT: [name] [name] DOB: 03/29/1956  REASON FOR VISIT: follow up HISTORY FROM: patient  Chief Complaint  Patient presents with   Follow-up    New room, alone. Had issue w/ data uploading. Nose piece irritating. Using Vit E oil, places thin piece of kleenex/gauze under nose piece still.     HISTORY OF PRESENT ILLNESS: 11/28/2020 ALL: [name] [name] returns for follow up for OSA on CPAP. She has been working on compliance. She is feeling better. She continues to work on getting comfortable with her mask. She is using nasal pillow. She is resting better. She denies difficulty with her machine.     11/29/2020 ALL:  [name] [name] returns for follow up for OSA. She has not used CPAP, recently. She reports several life changes with a move to [place] have contributed to discontinuation of CPAP. She is not feeling as well as she was. She is not sleeping as well. She has more stress with life changes. She is having more headaches. She describes sharp pains at various locations all over her head. Headaches wax and wane. No pattern or clear periods of time when headaches resolve. Headache can last hours or days. She usually takes Tylenol that helps. She has had similar headaches for years. She has a history of microvascular disease and was concerned she may need another MRI. She does recognize need for sleep apnea management. BP is usually 120's/70's. She feels it is elevated today due to rushing to get to her appt and having [employer] for breakfast. She is taking blood pressure medications and followed by PCP regularly.   02/16/2020 ALL:  [name] [name] is a 65 y.o. female here today for follow up OSA on CPAP therapy.  She admits to continued difficulty with CPAP compliance.  There have been multiple factors contributing.  She reports being more compliant over the past 30 days.  She does recognize the importance of using CPAP therapy.  She does report improved sleep quality and daytime energy  levels when using CPAP therapy.  She denies any difficulty with her CPAP or supplies.  She is motivated to continue using CPAP therapy.    Compliance report dated 01/16/2020 through 02/14/2020 reveals she used CPAP 17 of the past 30 days for compliance of 57%.  She used CPAP greater than 4 hours 17 of the past 30 days for compliance of 57%.  Average usage on days used was 6 hours and 58 minutes.  Residual AHI was 0.2 on 5 to 12 cm of water and EPR 3.  There was no significant leak noted.  HISTORY: (copied from my note on 08/10/2019)  [name] [name] is a 65 y.o. female here today for follow up post lacunar stroke in 2019 and OSA.  She reports that, overall, she is doing well.  She is followed closely by primary care.  She reports that blood pressures are typically well controlled.  She continues aspirin and atorvastatin for stroke prevention.  She reports that last A1c was 6.  No stroke symptoms since last being seen.   She admits that she continues to have difficulty with compliance using CPAP.  She feels like at the beginning of the year she was doing much better but then with stress from the pandemic and recently selling her home, she has fallen behind with compliance.  She does note benefit of using CPAP.  She is motivated to continue use.   Compliance report dated 07/10/2019 through 08/08/2019 reveals that she used CPAP 10 of  the past 30 days for compliance of 33%.  She used CPAP greater than 4 hours 9 of the last 30 days for compliance of 30%.  Average usage on days used was 5 hours and 22 minutes.  Residual AHI was 0.1 on 5 to 12 cm of water and an EPR of 3.  There was no significant leak noted.   HISTORY: (copied from  note on 03/30/2018)   HPI:  [name] [name] is a 65 y.o. female patient, seen here in a Rv 03-30-2018,   I have the pleasure of meeting Ms. [name] [name] today she had a home sleep test by apnea link on 14 December 2017 and had to my surprise a rather moderate to severe degree of  obstructive sleep apnea at an AHI of 25.7, RDI of 27.6 indicating that additional moderate snoring was present she also had a long this oxygenation time 28 minutes of her 240 minutes of test time were with out sufficient oxygenation.  The nadir for oxygen was 78%, but heart rate between 6160 bpm.  I recommended that she should try CPAP for obstructive sleep apnea at this degree.     CPAP was initiated and from mid-September on the patient began to use it more compliantly.  1 September to  22 October she used it for about 68% of the time with an average day user time of 6 hours 5 minutes, the residual AHI is 0.1 the pressure at the 95th percentile is 10.3 and we had another download here today over the last 30 days and unfortunately he the compliance is only 60% but the AHI remains very low at 0.1/h the 95th percentile pressure is 10.5 cm water.  I would like to increase the maximum pressure on her CPAP and urged her to use the machine each night for 4 hours minimum. She has used a nasal pillow dream wear- and cant sleep on her side as well.    CONSULT:  [name] [name] reports that she feels usually refreshed and rested after sleep, but often sleeps only 2 hours of sleep at night.  Her medical history includes hyperlipidemia, diabetes, hypertension, migraines, irritable bowel syndrome and a recent MRI of the brain that showed small vessel ischemia and a remote lacunar infarct in the left thalamus.  Overall it was unlikely that this gave her any symptoms of significance.  She also has gained weight over the last 5 to 10 years.  Was referred for healthy weight and wellness to Dr. Leafy Ro.  Dr. Jaynee Eagles wanted her to be evaluated for the possible presence of sleep apnea considering her risk for recurrent strokes and TIAs.  The patient continues on aspirin, blood pressure goal is to keep blood pressure below 130/90 mmHg, and her hemoglobin A1c below 6.5%.   Chief complaint according to patient : " Sometimes I just fall  asleep, mostly I don't need a lot of sleep- I watch TV at night"    Sleep habits are as follows: "I am a night owl, I go to bed at 10 o'clock and I am asleep whenever.  Soma and gabapentin help to fall asleep. I can go 3 nights with 6-8 hours of sleep, and others with 2-3 hours". Husband sleeps immediately when he lays down. The marital  bedroom is cool (74 degrees is considered cool by her), with a ceiling fan.  Sleeps on 2 pillows and on her side, still has hot flushes, still menopausal symptoms- 20 years after total hysterectomy and remains on HRT.  The TV is running all night -and she wakes up when husband switches it off.  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 She reports not being angry, upset, anxious or under an unusual amount of stress. Rises at 5-6 AM. Husband stated she snores, as do their children, have noted irregular breathing. She doubts that. She has nocturia once each night. She sleeps in intervals of 2- 4 hours, and when waking up has no headaches, neither pain, but a dry mouth. She has this sleep pattern for a long time, many years.  She may spend a whole day in bed sleeping on and off after a day where she had to be on her feet a lot.   Sleep medical history and family sleep history: unaware of biological family history. Mother deceased.  The patient reports that she has chronic back pain and has had multiple back surgeries, for total.  She is seen Dr. Saintclair Halsted for this.  She is not able to sit comfortably for longer than an hour or stand for longer than an hour.  She tries to move around or lays down to relieve her back pain.  She feels however that she does not need much sleep, and she watches TV at night in her bedroom.  Her history of diabetes hypertension and hyperlipidemia has been reviewed above.  Her current BMI is below 35.57.   Social history: 2 sons and one daughter, healthy- all adult.  lives with husband, shares her bedroom. Disabled due to back problems, 2 years.  Worked in [employer], office job, sitting.  Non smoker, ETOH - once a month a drink. Caffeine; none.    Dr. Cathren Laine note from 09-11-2017 : [name] [name] is a 65 y.o. female here as a referral from Dr. Criss Rosales for abnormal MRI.  Past medical history hyperlipidemia, diabetes, HTN, migraine, IBS. Patient was having neck pain and had MRI cervical spine  and saw something in the brain and a follow up was initiated. No difficulties with memory or balance, she has back pain and symptoms related to that but no significant balance issues or memory issues. No onset of symptoms, no acute onset of paresthesias however she does have problems in the legs due to back. She went due to problems in the arm, have been going on for the last 6 months, worsening with neck pain. Her grip on the right is worsening. She snores and stops breathing in her sleep.   History (copied from Dr Cathren Laine note on 03/15/2018)   Interval history: Here for follow up of stroke. This is a 65 year old patient who was referred for abnormal MRI.  Past medical history includes hyperlipidemia, diabetes, hypertension, migraine, IBS.  MRI of the brain showed chronic small vessel ischemia and a remote lacunar infarct in the left thalamus. Diagnosed with sleep apnea and is now using a cpap. She had moderately severe OSA AHI 26. She does not know if she is feeling better. She has an appointment with Dr. Brett Fairy the end of the month. Otherwise doing well, no problems, just having neck issues and ongoing dry needling. Reviewed report, she is doing well > 70% using cpap >=4 hours.    HPI:  [name] [name] is a 65 y.o. female here as a referral from Dr. Domingo Madeira for abnormal MRI.  Past medical history hyperlipidemia, diabetes, HTN, migraine, IBS. Patient was having neck pain and had mri cervical and saw something in the brain and a follow up was initiated. No difficulties with memory  or balance, she has back pain and symptoms related to that but no significant  balance issues or memory issues. No onset of symptoms, no acute onset of paresthesias however she does have problems in the legs due to back. She went due to problems in the arm, have been going on for the last 6 months, worsening with neck pain. Her grip on the right is worsening. She snores and stops breathing in her sleep.   Reviewed notes, labs and imaging from outside physicians, which showed:   Reviewed referring physician notes for lesion of the brainstem.  MRI of the brain was performed by [facility].  Overall she remains asymptomatic.  She has chronic neck and back pain and she is doing physical therapy and working with pain management.  Reviewed thorough complete neurologic exam which was nonfocal.  MRI of the brain shows increased T2 signal in her brainstem with what appears to be expansion of the pons and small vessel ischemic changes throughout the white matter cerebral hemispheres.   Personally reviewed MRI brain images and agree with the following:   IMPRESSION: [place]ine signal abnormality on previous cervical MRI is most likely chronic small vessel ischemia, which is also seen to a moderate degree in the cerebral white matter. There is remote lacunar infarct in the left thalamus. If there are bulbar symptoms a follow-up could be obtained to document stability.   hgba1c 6 2016    REVIEW OF SYSTEMS: Out of a complete 14 system review of symptoms, the patient complains only of the following symptoms, chronic pain and all other reviewed systems are negative.  ESS: 2  ALLERGIES: Allergies  Allergen Reactions   Adhesive [Tape] Dermatitis   Latex Dermatitis   Codeine Nausea And Vomiting   Erythromycin Itching and Swelling   Amoxicillin Diarrhea   Azithromycin    Macrolides And Ketolides    Other     FOOD ALLERGIES MSG  Green peas Onions cucumber Mustard Peanuts Corn sesame Yellow onions lettuce RED DYE NO PERFUMES   Peanut-Containing Drug Products     Potassium Clavulanate [Clavulanic Acid]    Augmentin [Amoxicillin-Pot Clavulanate] Diarrhea    Per discussion 07/26/2012, pt tolerates Omnicef well.   Iodinated Diagnostic Agents Itching    Tolerates iodinated contrast with Benadryl 50mg  PO prior to administration.  jkl    HOME MEDICATIONS: Outpatient Medications Prior to Visit  Medication Sig Dispense Refill   amLODipine (NORVASC) 5 MG tablet Take 5 mg by mouth daily.     aspirin EC 325 MG tablet Take 1 tablet (325 mg total) by mouth daily. 30 tablet 0   atorvastatin (LIPITOR) 40 MG tablet Take 40 mg by mouth daily.     cholecalciferol (VITAMIN D3) 25 MCG (1000 UT) tablet Take 2,000 Units by mouth daily.     cyclobenzaprine (FLEXERIL) 5 MG tablet Take 5 mg by mouth 3 (three) times daily as needed for muscle spasms.     EPINEPHRINE, ANAPHYLAXIS THERAPY AGENTS, Inject 0.3 mg into the muscle.     estradiol (ESTRACE) 1 MG tablet Take 1 mg by mouth daily.     fenofibrate (TRICOR) 145 MG tablet Take 145 mg by mouth daily.     fluticasone (FLONASE) 50 MCG/ACT nasal spray Place 1 spray into both nostrils daily.     ibuprofen (ADVIL,MOTRIN) 800 MG tablet Take 800 mg by mouth every 8 (eight) hours as needed for moderate pain.     latanoprost (XALATAN) 0.005 % ophthalmic solution Place 1 drop into both eyes at  bedtime.     montelukast (SINGULAIR) 10 MG tablet Take 10 mg by mouth at bedtime.     potassium chloride SA (K-DUR,KLOR-CON) 20 MEQ tablet Take 20 mEq by mouth daily.     SitaGLIPtin-MetFORMIN HCl [id number] MG TB24 Take 1 tablet by mouth at bedtime.     valsartan-hydrochlorothiazide (DIOVAN-HCT) 160-12.5 MG tablet Take 1 tablet by mouth daily.     No facility-administered medications prior to visit.    PAST MEDICAL HISTORY: Past Medical History:  Diagnosis Date   Angiomyolipoma of left kidney    Arthritis    left foot   Bronchitis    Complication of anesthesia    Patient woke up during bunionectomy and breast cyst removal surgery    Diabetes mellitus without complication ([place])    Environmental and seasonal allergies    GERD (gastroesophageal reflux disease)    Glaucoma    Bilateral   Hyperlipemia    Hypertension    Lower back pain    Mini stroke    Multiple food allergies    MSG, green peas, onions, cucumber, mustard, peanuts, corn, sesame, lettuce, red dye, perfumes   Optic nerve disorder    left eye   Pneumonia 2014   PONV (postoperative nausea and vomiting)    Slight nausea   Prediabetes    Rheumatoid arthritis ([place])    Sleep apnea    Vitamin D deficiency     PAST SURGICAL HISTORY: Past Surgical History:  Procedure Laterality Date   ABDOMINAL HYSTERECTOMY     BACK SURGERY     X 4   BREAST CYST EXCISION Bilateral    20+ years ago   BUNIONECTOMY Bilateral    CATARACT EXTRACTION     CATARACT EXTRACTION, BILATERAL Bilateral 2019   with lens implant; Dr. Bing Plume. June 2019 Left eye; July 2019 Right eye.   COLONOSCOPY     EYE SURGERY     FINGER SURGERY Left    Thumb    FAMILY HISTORY: Family History  Adopted: Yes  Problem Relation Age of Onset   Hyperlipidemia Mother    Hypertension Mother    Colon cancer Neg Hx    Stomach cancer Neg Hx    Esophageal cancer Neg Hx     SOCIAL HISTORY: Social History   Socioeconomic History   Marital status: Married    Spouse name: Eleen Litz   Number of children: 2   Years of education: 14+   Highest education level: Some college, no degree  Occupational History   Occupation: Retired   Tobacco Use   Smoking status: Never   Smokeless tobacco: Never  Vaping Use   Vaping Use: Never used  Substance and Sexual Activity   Alcohol use: Yes    Alcohol/week: 0.0 standard drinks    Comment: once a year   Drug use: No   Sexual activity: Not on file  Other Topics Concern   Not on file  Social History Narrative   Married. She has 2 daughters. She works in Press photographer. No caffeine- allergic    Right handed         Social Determinants of Health    Financial Resource Strain: Not on file  Food Insecurity: Not on file  Transportation Needs: Not on file  Physical Activity: Not on file  Stress: Not on file  Social Connections: Not on file  Intimate Partner Violence: Not on file      PHYSICAL EXAM  Vitals:   03/21/21 1032  BP: (!) 148/71  Pulse:  96  SpO2: 97%  Weight: 202 lb 9.6 oz (91.9 kg)  Height: 5\' 6"  (1.676 m)     Body mass index is 32.7 kg/m.  Generalized: Well developed, in no acute distress  Cardiology: normal rate and rhythm, no murmur noted Respiratory: clear to auscultation bilaterally  Neurological examination  Mentation: Alert oriented to time, place, history taking. Follows all commands speech and language fluent Cranial nerve II-XII: Pupils were equal round reactive to light. Extraocular movements were full, visual field were full  Motor: The motor testing reveals 5 over 5 strength of all 4 extremities. Good symmetric motor tone is noted throughout.  Gait and station: Gait is normal.    DIAGNOSTIC DATA (LABS, IMAGING, TESTING) - I reviewed patient records, labs, notes, testing and imaging myself where available.  No flowsheet data found.   Lab Results  Component Value Date   WBC 9.0 04/06/2018   HGB 12.5 04/06/2018   HCT 39.0 04/06/2018   MCV 95 04/06/2018   PLT 390.0 12/09/2017      Component Value Date/Time   NA 139 07/21/2018 0827   K 4.3 07/21/2018 0827   CL 103 07/21/2018 0827   CO2 21 07/21/2018 0827   GLUCOSE 87 07/21/2018 0827   GLUCOSE 96 12/09/2017 1020   BUN 13 07/21/2018 0827   CREATININE 1.00 07/21/2018 0827   CALCIUM 9.9 07/21/2018 0827   PROT 7.4 07/21/2018 0827   ALBUMIN 4.2 07/21/2018 0827   AST 17 07/21/2018 0827   ALT 20 07/21/2018 0827   ALKPHOS 28 (L) 07/21/2018 0827   BILITOT 0.3 07/21/2018 0827   GFRNONAA 60 07/21/2018 0827   GFRAA 69 07/21/2018 0827   Lab Results  Component Value Date   CHOL 140 07/21/2018   HDL 38 (L) 07/21/2018   LDLCALC 74  07/21/2018   TRIG 138 07/21/2018   Lab Results  Component Value Date   HGBA1C 5.8 (H) 07/21/2018   Lab Results  Component Value Date   VITAMINB12 392 04/06/2018   Lab Results  Component Value Date   TSH 1.820 04/06/2018       ASSESSMENT AND PLAN 65 y.o. year old female  has a past medical history of Angiomyolipoma of left kidney, Arthritis, Bronchitis, Complication of anesthesia, Diabetes mellitus without complication ([place]), Environmental and seasonal allergies, GERD (gastroesophageal reflux disease), Glaucoma, Hyperlipemia, Hypertension, Lower back pain, Mini stroke, Multiple food allergies, Optic nerve disorder, Pneumonia (2014), PONV (postoperative nausea and vomiting), Prediabetes, Rheumatoid arthritis ([place]), Sleep apnea, and Vitamin D deficiency. here with     ICD-10-CM   1. OSA on CPAP  G47.33    Z99.89       Rejina is doing better. She is working on compliance and feeling better. Most recent compliance data shows 58% daily and 57% 4 hour compliance. She was encouraged to continue working to meet minimum goal of 70% compliance. I have encouraged her to use CPAP every night for at least 4 hours. Healthy lifestyle habits advised. She will return to see me in 6 months, sooner if needed.  She verbalizes understanding and agreement with this plan.   No orders of the defined types were placed in this encounter.    No orders of the defined types were placed in this encounter.     Debbora Presto, FNP-C 03/21/2021, 10:52 AM [facility] [address] [place], [place] [address] [phone]

## 2021-03-21 NOTE — Patient Instructions (Addendum)
Please continue using your CPAP regularly. While your insurance requires that you use CPAP at least 4 hours each night on 70% of the nights, I recommend, that you not skip any nights and use it throughout the night if you can. Getting used to CPAP and staying with the treatment long term does take time and patience and discipline. Untreated obstructive sleep apnea when it is moderate to severe can have an adverse impact on cardiovascular health and raise her risk for heart disease, arrhythmias, hypertension, congestive heart failure, stroke and diabetes. Untreated obstructive sleep apnea causes sleep disruption, nonrestorative sleep, and sleep deprivation. This can have an impact on your day to day functioning and cause daytime sleepiness and impairment of cognitive function, memory loss, mood disturbance, and problems focussing. Using CPAP regularly can improve these symptoms.  Follow up in 6 months.  

## 2021-03-29 ENCOUNTER — Other Ambulatory Visit: Payer: Self-pay | Admitting: Neurosurgery

## 2021-03-29 DIAGNOSIS — M542 Cervicalgia: Secondary | ICD-10-CM

## 2021-03-29 DIAGNOSIS — M5440 Lumbago with sciatica, unspecified side: Secondary | ICD-10-CM

## 2021-04-05 ENCOUNTER — Other Ambulatory Visit: Payer: Self-pay | Admitting: Family Medicine

## 2021-04-09 ENCOUNTER — Ambulatory Visit
Admission: RE | Admit: 2021-04-09 | Discharge: 2021-04-09 | Disposition: A | Discharge status: Home / Self Care | Payer: Medicare Other | Source: Ambulatory Visit | Attending: Neurosurgery | Admitting: Neurosurgery

## 2021-04-09 ENCOUNTER — Other Ambulatory Visit: Payer: Self-pay

## 2021-04-09 DIAGNOSIS — M542 Cervicalgia: Secondary | ICD-10-CM

## 2021-04-09 DIAGNOSIS — M5440 Lumbago with sciatica, unspecified side: Secondary | ICD-10-CM

## 2021-04-16 ENCOUNTER — Other Ambulatory Visit: Payer: Medicare Other

## 2021-09-25 NOTE — Progress Notes (Signed)
? ? ?PATIENT: [name] [name] ?DOB: 1955-08-12 ? ?REASON FOR VISIT: follow up ?HISTORY FROM: patient ? ?Chief Complaint  ?Patient presents with  ? Obstructive Sleep Apnea  ?  Rm 2, alone. Here for 6 month CPAP f/u. Pt reports doing well.   ? ? ?HISTORY OF PRESENT ILLNESS: ? ?09/26/2021 ALL: ?Greenleigh returns for follow up for OSA on CPAP. She was last seen 03/2021 and continued to have difficulty with compliance. She met 58% compliance over 90 days at that time. She has continues to work on improving compliance. She is doing well on CPAP. She endorses better sleep quality and more energy when using CPAP. She admits that she often falls asleep before starting therapy.  ? ?She has joined silver sneakers and walking more. She had acute sciatic pain that limited activity recently but doing a little better.  ? ? ? ?03/21/2021 ALL:  ?[name] [name] returns for follow up for OSA on CPAP. She has been working on compliance. She is feeling better. She continues to work on getting comfortable with her mask. She is using nasal pillow. She is resting better. She denies difficulty with her machine.  ? ? ? ?11/29/2020 ALL:  ?[name] [name] returns for follow up for OSA. She has not used CPAP, recently. She reports several life changes with a move to [place] have contributed to discontinuation of CPAP. She is not feeling as well as she was. She is not sleeping as well. She has more stress with life changes. She is having more headaches. She describes sharp pains at various locations all over her head. Headaches wax and wane. No pattern or clear periods of time when headaches resolve. Headache can last hours or days. She usually takes Tylenol that helps. She has had similar headaches for years. She has a history of microvascular disease and was concerned she may need another MRI. She does recognize need for sleep apnea management. BP is usually 120's/70's. She feels it is elevated today due to rushing to get to her appt and having  [employer] for breakfast. She is taking blood pressure medications and followed by PCP regularly.  ? ?02/16/2020 ALL:  ?[name] [name] is a 66 y.o. female here today for follow up OSA on CPAP therapy.  She admits to continued difficulty with CPAP compliance.  There have been multiple factors contributing.  She reports being more compliant over the past 30 days.  She does recognize the importance of using CPAP therapy.  She does report improved sleep quality and daytime energy levels when using CPAP therapy.  She denies any difficulty with her CPAP or supplies.  She is motivated to continue using CPAP therapy.   ? ?Compliance report dated 01/16/2020 through 02/14/2020 reveals she used CPAP 17 of the past 30 days for compliance of 57%.  She used CPAP greater than 4 hours 17 of the past 30 days for compliance of 57%.  Average usage on days used was 6 hours and 58 minutes.  Residual AHI was 0.2 on 5 to 12 cm of water and EPR 3.  There was no significant leak noted. ? ?HISTORY: (copied from my note on 08/10/2019) ? ?[name] [name] is a 66 y.o. female here today for follow up post lacunar stroke in 2019 and OSA.  She reports that, overall, she is doing well.  She is followed closely by primary care.  She reports that blood pressures are typically well controlled.  She continues aspirin and atorvastatin for stroke prevention.  She reports that  last A1c was 6.  No stroke symptoms since last being seen. ?  ?She admits that she continues to have difficulty with compliance using CPAP.  She feels like at the beginning of the year she was doing much better but then with stress from the pandemic and recently selling her home, she has fallen behind with compliance.  She does note benefit of using CPAP.  She is motivated to continue use. ?  ?Compliance report dated 07/10/2019 through 08/08/2019 reveals that she used CPAP 10 of the past 30 days for compliance of 33%.  She used CPAP greater than 4 hours 9 of the last 30 days for  compliance of 30%.  Average usage on days used was 5 hours and 22 minutes.  Residual AHI was 0.1 on 5 to 12 cm of water and an EPR of 3.  There was no significant leak noted. ?  ?HISTORY: (copied from  note on 03/30/2018) ?  ?HPI:  [name] [name] is a 66 y.o. female patient, seen here in a Rv 03-30-2018, ?  ?I have the pleasure of meeting [name] [name] today she had a home sleep test by apnea link on 14 December 2017 and had to my surprise a rather moderate to severe degree of obstructive sleep apnea at an AHI of 25.7, RDI of 27.6 indicating that additional moderate snoring was present she also had a long this oxygenation time 28 minutes of her 240 minutes of test time were with out sufficient oxygenation.  The nadir for oxygen was 78%, but heart rate between 6160 bpm.  I recommended that she should try CPAP for obstructive sleep apnea at this degree.   ?  ?CPAP was initiated and from mid-September on the patient began to use it more compliantly.  71 September to  22 October she used it for about 68% of the time with an average day user time of 6 hours 5 minutes, the residual AHI is 0.1 the pressure at the 95th percentile is 10.3 and we had another download here today over the last 30 days and unfortunately he the compliance is only 60% but the AHI remains very low at 0.1/h the 95th percentile pressure is 10.5 cm water.  I would like to increase the maximum pressure on her CPAP and urged her to use the machine each night for 4 hours minimum. She has used a nasal pillow dream wear- and cant sleep on her side as well.  ?  ?CONSULT:  ?[name] [name] reports that she feels usually refreshed and rested after sleep, but often sleeps only 2 hours of sleep at night.  Her medical history includes hyperlipidemia, diabetes, hypertension, migraines, irritable bowel syndrome and a recent MRI of the brain that showed small vessel ischemia and a remote lacunar infarct in the left thalamus.  Overall it was unlikely that this gave her  any symptoms of significance.  She also has gained weight over the last 5 to 10 years.  Was referred for healthy weight and wellness to Dr. Leafy Ro.  Dr. Jaynee Eagles wanted her to be evaluated for the possible presence of sleep apnea considering her risk for recurrent strokes and TIAs.  The patient continues on aspirin, blood pressure goal is to keep blood pressure below 130/90 mmHg, and her hemoglobin A1c below 6.5%. ?  ?Chief complaint according to patient : " Sometimes I just fall asleep, mostly I don't need a lot of sleep- I watch TV at night"  ?  ?Sleep habits are as follows: "  I am a night owl, I go to bed at 10 o'clock and I am asleep whenever.  Soma and gabapentin help to fall asleep. I can go 3 nights with 6-8 hours of sleep, and others with 2-3 hours". Husband sleeps immediately when he lays down. ?The marital  bedroom is cool (74 degrees is considered cool by her), with a ceiling fan.  Sleeps on 2 pillows and on her side, still has hot flushes, still menopausal symptoms- 20 years after total hysterectomy and remains on HRT. The TV is running all night -and she wakes up when husband switches it off.  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 ?She reports not being angry, upset, anxious or under an unusual amount of stress. Rises at 5-6 AM. Husband stated she snores, as do their children, have noted irregular breathing. She doubts that. She has nocturia once each night. She sleeps in intervals of 2- 4 hours, and when waking up has no headaches, neither pain, but a dry mouth. She has this sleep pattern for a long time, many years.  ?She may spend a whole day in bed sleeping on and off after a day where she had to be on her feet a lot. ?  ?Sleep medical history and family sleep history: unaware of biological family history. Mother deceased.  ?The patient reports that she has chronic back pain and has had multiple back surgeries, for total.  She is seen Dr. Saintclair Halsted for this.  She is not able to sit  comfortably for longer than an hour or stand for longer than an hour.  She tries to move around or lays down to relieve her back pain.  She feels however that she does not need much sleep, and she watches TV at night in h

## 2021-09-25 NOTE — Patient Instructions (Addendum)
Please continue using your CPAP regularly. While your insurance requires that you use CPAP at least 4 hours each night on 70% of the nights, I recommend, that you not skip any nights and use it throughout the night if you can. Getting used to CPAP and staying with the treatment long term does take time and patience and discipline. Untreated obstructive sleep apnea when it is moderate to severe can have an adverse impact on cardiovascular health and raise her risk for heart disease, arrhythmias, hypertension, congestive heart failure, stroke and diabetes. Untreated obstructive sleep apnea causes sleep disruption, nonrestorative sleep, and sleep deprivation. This can have an impact on your day to day functioning and cause daytime sleepiness and impairment of cognitive function, memory loss, mood disturbance, and problems focussing. Using CPAP regularly can improve these symptoms. ? ?Please continue to work on using CPAP every night for at least 4 hours.  ? ?Follow up with me in 6 months  ? ?

## 2021-09-26 ENCOUNTER — Encounter: Payer: Self-pay | Admitting: Family Medicine

## 2021-09-26 ENCOUNTER — Ambulatory Visit: Payer: Medicare Other | Admitting: Family Medicine

## 2021-09-26 VITALS — BP 145/80 | HR 107 | Ht 66.0 in | Wt 212.0 lb

## 2021-09-26 DIAGNOSIS — G4733 Obstructive sleep apnea (adult) (pediatric): Secondary | ICD-10-CM

## 2021-09-26 DIAGNOSIS — Z9989 Dependence on other enabling machines and devices: Secondary | ICD-10-CM

## 2021-10-30 ENCOUNTER — Other Ambulatory Visit: Payer: Self-pay | Admitting: Family Medicine

## 2021-10-30 DIAGNOSIS — Z1231 Encounter for screening mammogram for malignant neoplasm of breast: Secondary | ICD-10-CM

## 2021-12-09 ENCOUNTER — Ambulatory Visit
Admission: RE | Admit: 2021-12-09 | Discharge: 2021-12-09 | Disposition: A | Discharge status: Home / Self Care | Payer: Medicare Other | Source: Ambulatory Visit | Attending: Family Medicine | Admitting: Family Medicine

## 2021-12-09 DIAGNOSIS — Z1231 Encounter for screening mammogram for malignant neoplasm of breast: Secondary | ICD-10-CM

## 2022-02-19 ENCOUNTER — Other Ambulatory Visit: Payer: Self-pay | Admitting: Family Medicine

## 2022-02-19 DIAGNOSIS — M810 Age-related osteoporosis without current pathological fracture: Secondary | ICD-10-CM

## 2022-02-28 ENCOUNTER — Telehealth: Payer: Self-pay | Admitting: Family Medicine

## 2022-02-28 ENCOUNTER — Other Ambulatory Visit: Payer: Self-pay | Admitting: Family Medicine

## 2022-02-28 DIAGNOSIS — Z1231 Encounter for screening mammogram for malignant neoplasm of breast: Secondary | ICD-10-CM

## 2022-02-28 NOTE — Telephone Encounter (Signed)
   Called the patient back and reviewed the compliance data. In looking at last 30 and 90 days she is 73% compliant with the machine which means she should be compliant. She was stating that she was getting over charged. I looked even further back to April and she was still 70% compliant which should be appropriate. Pt will check in with her insurance to determine what is going on and why her bill has increased.

## 2022-02-28 NOTE — Telephone Encounter (Signed)
Pt is calling. Stated she wants to know how many hours she using the CPAP machine. Pt is requesting a return call.

## 2022-03-31 NOTE — Progress Notes (Unsigned)
PATIENT: [name] [name] DOB: 11/10/1955  REASON FOR VISIT: follow up HISTORY FROM: patient  No chief complaint on file.   HISTORY OF PRESENT ILLNESS:  04/03/2022 ALL: [name] [name] returns for follow up for OSA on CPAP.   09/26/2021 ALL: [name] [name] returns for follow up for OSA on CPAP. She was last seen 03/2021 and continued to have difficulty with compliance. She met 58% compliance over 90 days at that time. She has continues to work on improving compliance. She is doing well on CPAP. She endorses better sleep quality and more energy when using CPAP. She admits that she often falls asleep before starting therapy.   She has joined silver sneakers and walking more. She had acute sciatic pain that limited activity recently but doing a little better.     03/21/2021 ALL:  [name] [name] returns for follow up for OSA on CPAP. She has been working on compliance. She is feeling better. She continues to work on getting comfortable with her mask. She is using nasal pillow. She is resting better. She denies difficulty with her machine.     11/29/2020 ALL:  [name] [name] returns for follow up for OSA. She has not used CPAP, recently. She reports several life changes with a move to [place] have contributed to discontinuation of CPAP. She is not feeling as well as she was. She is not sleeping as well. She has more stress with life changes. She is having more headaches. She describes sharp pains at various locations all over her head. Headaches wax and wane. No pattern or clear periods of time when headaches resolve. Headache can last hours or days. She usually takes Tylenol that helps. She has had similar headaches for years. She has a history of microvascular disease and was concerned she may need another MRI. She does recognize need for sleep apnea management. BP is usually 120's/70's. She feels it is elevated today due to rushing to get to her appt and having [employer] for breakfast. She is taking blood  pressure medications and followed by PCP regularly.   02/16/2020 ALL:  [name] [name] is a 66 y.o. female here today for follow up OSA on CPAP therapy.  She admits to continued difficulty with CPAP compliance.  There have been multiple factors contributing.  She reports being more compliant over the past 30 days.  She does recognize the importance of using CPAP therapy.  She does report improved sleep quality and daytime energy levels when using CPAP therapy.  She denies any difficulty with her CPAP or supplies.  She is motivated to continue using CPAP therapy.    Compliance report dated 01/16/2020 through 02/14/2020 reveals she used CPAP 17 of the past 30 days for compliance of 57%.  She used CPAP greater than 4 hours 17 of the past 30 days for compliance of 57%.  Average usage on days used was 6 hours and 58 minutes.  Residual AHI was 0.2 on 5 to 12 cm of water and EPR 3.  There was no significant leak noted.  HISTORY: (copied from my note on 08/10/2019)  [name] [name] is a 66 y.o. female here today for follow up post lacunar stroke in 2019 and OSA.  She reports that, overall, she is doing well.  She is followed closely by primary care.  She reports that blood pressures are typically well controlled.  She continues aspirin and atorvastatin for stroke prevention.  She reports that last A1c was 6.  No stroke symptoms since last being  seen.   She admits that she continues to have difficulty with compliance using CPAP.  She feels like at the beginning of the year she was doing much better but then with stress from the pandemic and recently selling her home, she has fallen behind with compliance.  She does note benefit of using CPAP.  She is motivated to continue use.   Compliance report dated 07/10/2019 through 08/08/2019 reveals that she used CPAP 10 of the past 30 days for compliance of 33%.  She used CPAP greater than 4 hours 9 of the last 30 days for compliance of 30%.  Average usage on days used  was 5 hours and 22 minutes.  Residual AHI was 0.1 on 5 to 12 cm of water and an EPR of 3.  There was no significant leak noted.   HISTORY: (copied from  note on 03/30/2018)   HPI:  [name] [name] is a 66 y.o. female patient, seen here in a Rv 03-30-2018,   I have the pleasure of meeting Ms. [name] [name] today she had a home sleep test by apnea link on 14 December 2017 and had to my surprise a rather moderate to severe degree of obstructive sleep apnea at an AHI of 25.7, RDI of 27.6 indicating that additional moderate snoring was present she also had a long this oxygenation time 28 minutes of her 240 minutes of test time were with out sufficient oxygenation.  The nadir for oxygen was 78%, but heart rate between 6160 bpm.  I recommended that she should try CPAP for obstructive sleep apnea at this degree.     CPAP was initiated and from mid-September on the patient began to use it more compliantly.  22 September to  22 October she used it for about 68% of the time with an average day user time of 6 hours 5 minutes, the residual AHI is 0.1 the pressure at the 95th percentile is 10.3 and we had another download here today over the last 30 days and unfortunately he the compliance is only 60% but the AHI remains very low at 0.1/h the 95th percentile pressure is 10.5 cm water.  I would like to increase the maximum pressure on her CPAP and urged her to use the machine each night for 4 hours minimum. She has used a nasal pillow dream wear- and cant sleep on her side as well.    CONSULT:  [name] [name] reports that she feels usually refreshed and rested after sleep, but often sleeps only 2 hours of sleep at night.  Her medical history includes hyperlipidemia, diabetes, hypertension, migraines, irritable bowel syndrome and a recent MRI of the brain that showed small vessel ischemia and a remote lacunar infarct in the left thalamus.  Overall it was unlikely that this gave her any symptoms of significance.  She also has  gained weight over the last 5 to 10 years.  Was referred for healthy weight and wellness to Dr. Beasley.  Dr. Ahern wanted her to be evaluated for the possible presence of sleep apnea considering her risk for recurrent strokes and TIAs.  The patient continues on aspirin, blood pressure goal is to keep blood pressure below 130/90 mmHg, and her hemoglobin A1c below 6.5%.   Chief complaint according to patient : " Sometimes I just fall asleep, mostly I don't need a lot of sleep- I watch TV at night"    Sleep habits are as follows: "I am a night owl, I go to bed at 10   o'clock and I am asleep whenever.  Soma and gabapentin help to fall asleep. I can go 3 nights with 6-8 hours of sleep, and others with 2-3 hours". Husband sleeps immediately when he lays down. The marital  bedroom is cool (74 degrees is considered cool by her), with a ceiling fan.  Sleeps on 2 pillows and on her side, still has hot flushes, still menopausal symptoms- 20 years after total hysterectomy and remains on HRT. The TV is running all night -and she wakes up when husband switches it off.  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 She reports not being angry, upset, anxious or under an unusual amount of stress. Rises at 5-6 AM. Husband stated she snores, as do their children, have noted irregular breathing. She doubts that. She has nocturia once each night. She sleeps in intervals of 2- 4 hours, and when waking up has no headaches, neither pain, but a dry mouth. She has this sleep pattern for a long time, many years.  She may spend a whole day in bed sleeping on and off after a day where she had to be on her feet a lot.   Sleep medical history and family sleep history: unaware of biological family history. Mother deceased.  The patient reports that she has chronic back pain and has had multiple back surgeries, for total.  She is seen Dr. Cram for this.  She is not able to sit comfortably for longer than an hour or stand for  longer than an hour.  She tries to move around or lays down to relieve her back pain.  She feels however that she does not need much sleep, and she watches TV at night in her bedroom.  Her history of diabetes hypertension and hyperlipidemia has been reviewed above.  Her current BMI is below 35.57.   Social history: 2 sons and one daughter, healthy- all adult.  lives with husband, shares her bedroom. Disabled due to back problems, 2 years. Worked in accounting, office job, sitting.  Non smoker, ETOH - once a month a drink. Caffeine; none.    Dr. Ahern's note from 09-11-2017 : [name] [name] is a 66 y.o. female here as a referral from Dr. Bland for abnormal MRI.  Past medical history hyperlipidemia, diabetes, HTN, migraine, IBS. Patient was having neck pain and had MRI cervical spine  and saw something in the brain and a follow up was initiated. No difficulties with memory or balance, she has back pain and symptoms related to that but no significant balance issues or memory issues. No onset of symptoms, no acute onset of paresthesias however she does have problems in the legs due to back. She went due to problems in the arm, have been going on for the last 6 months, worsening with neck pain. Her grip on the right is worsening. She snores and stops breathing in her sleep.   History (copied from Dr Ahern's note on 03/15/2018)   Interval history: Here for follow up of stroke. This is a 62-year-old patient who was referred for abnormal MRI.  Past medical history includes hyperlipidemia, diabetes, hypertension, migraine, IBS.  MRI of the brain showed chronic small vessel ischemia and a remote lacunar infarct in the left thalamus. Diagnosed with sleep apnea and is now using a cpap. She had moderately severe OSA AHI 26. She does not know if she is feeling better. She has an appointment with Dr. Dohmeier the end of the month. Otherwise doing well, no   problems, just having neck issues and ongoing dry needling.  Reviewed report, she is doing well > 70% using cpap >=4 hours.    HPI:  [name] [name] is a 66 y.o. female here as a referral from Dr. Center for abnormal MRI.  Past medical history hyperlipidemia, diabetes, HTN, migraine, IBS. Patient was having neck pain and had mri cervical and saw something in the brain and a follow up was initiated. No difficulties with memory or balance, she has back pain and symptoms related to that but no significant balance issues or memory issues. No onset of symptoms, no acute onset of paresthesias however she does have problems in the legs due to back. She went due to problems in the arm, have been going on for the last 6 months, worsening with neck pain. Her grip on the right is worsening. She snores and stops breathing in her sleep.   Reviewed notes, labs and imaging from outside physicians, which showed:   Reviewed referring physician notes for lesion of the brainstem.  MRI of the brain was performed by [facility].  Overall she remains asymptomatic.  She has chronic neck and back pain and she is doing physical therapy and working with pain management.  Reviewed thorough complete neurologic exam which was nonfocal.  MRI of the brain shows increased T2 signal in her brainstem with what appears to be expansion of the pons and small vessel ischemic changes throughout the white matter cerebral hemispheres.   Personally reviewed MRI brain images and agree with the following:   IMPRESSION: Pontine signal abnormality on previous cervical MRI is most likely chronic small vessel ischemia, which is also seen to a moderate degree in the cerebral white matter. There is remote lacunar infarct in the left thalamus. If there are bulbar symptoms a follow-up could be obtained to document stability.   hgba1c 6 2016    REVIEW OF SYSTEMS: Out of a complete 14 system review of symptoms, the patient complains only of the following symptoms, chronic pain and all other reviewed  systems are negative.  ESS: 2  ALLERGIES: Allergies  Allergen Reactions   Adhesive [Tape] Dermatitis   Latex Dermatitis   Codeine Nausea And Vomiting   Erythromycin Itching and Swelling   Amoxicillin Diarrhea   Azithromycin    Macrolides And Ketolides    Other     FOOD ALLERGIES MSG  Green peas Onions cucumber Mustard Peanuts Corn sesame Yellow onions lettuce RED DYE NO PERFUMES   Peanut-Containing Drug Products    Potassium Clavulanate [Clavulanic Acid]    Augmentin [Amoxicillin-Pot Clavulanate] Diarrhea    Per discussion 07/26/2012, pt tolerates Omnicef well.   Iodinated Contrast Media Itching    Tolerates iodinated contrast with Benadryl 50mg PO prior to administration.  jkl    HOME MEDICATIONS: Outpatient Medications Prior to Visit  Medication Sig Dispense Refill   amLODipine (NORVASC) 5 MG tablet Take 5 mg by mouth daily.     aspirin EC 325 MG tablet Take 1 tablet (325 mg total) by mouth daily. 30 tablet 0   atorvastatin (LIPITOR) 40 MG tablet Take 40 mg by mouth daily.     cholecalciferol (VITAMIN D3) 25 MCG (1000 UT) tablet Take 2,000 Units by mouth daily.     cyclobenzaprine (FLEXERIL) 5 MG tablet Take 5 mg by mouth 3 (three) times daily as needed for muscle spasms.     EPINEPHRINE, ANAPHYLAXIS THERAPY AGENTS, Inject 0.3 mg into the muscle.     estradiol (ESTRACE) 1 MG tablet   Take 1 mg by mouth daily.     fenofibrate (TRICOR) 145 MG tablet Take 145 mg by mouth daily.     fluticasone (FLONASE) 50 MCG/ACT nasal spray Place 1 spray into both nostrils daily.     ibuprofen (ADVIL,MOTRIN) 800 MG tablet Take 800 mg by mouth every 8 (eight) hours as needed for moderate pain.     latanoprost (XALATAN) 0.005 % ophthalmic solution Place 1 drop into both eyes at bedtime.     montelukast (SINGULAIR) 10 MG tablet Take 10 mg by mouth at bedtime.     potassium chloride SA (K-DUR,KLOR-CON) 20 MEQ tablet Take 20 mEq by mouth daily.     SitaGLIPtin-MetFORMIN HCl [phone] MG  TB24 Take 1 tablet by mouth at bedtime.     valsartan-hydrochlorothiazide (DIOVAN-HCT) 160-12.5 MG tablet Take 1 tablet by mouth daily.     No facility-administered medications prior to visit.    PAST MEDICAL HISTORY: Past Medical History:  Diagnosis Date   Angiomyolipoma of left kidney    Arthritis    left foot   Bronchitis    Complication of anesthesia    Patient woke up during bunionectomy and breast cyst removal surgery   Diabetes mellitus without complication ([place])    Environmental and seasonal allergies    GERD (gastroesophageal reflux disease)    Glaucoma    Bilateral   Hyperlipemia    Hypertension    Lower back pain    Mini stroke    Multiple food allergies    MSG, green peas, onions, cucumber, mustard, peanuts, corn, sesame, lettuce, red dye, perfumes   Optic nerve disorder    left eye   Pneumonia 2014   PONV (postoperative nausea and vomiting)    Slight nausea   Prediabetes    Rheumatoid arthritis ([place])    Sleep apnea    Vitamin D deficiency     PAST SURGICAL HISTORY: Past Surgical History:  Procedure Laterality Date   ABDOMINAL HYSTERECTOMY     BACK SURGERY     X 4   BREAST CYST EXCISION Bilateral    20+ years ago   BUNIONECTOMY Bilateral    CATARACT EXTRACTION     CATARACT EXTRACTION, BILATERAL Bilateral 2019   with lens implant; Dr. Bing Plume. June 2019 Left eye; July 2019 Right eye.   COLONOSCOPY     EYE SURGERY     FINGER SURGERY Left    Thumb    FAMILY HISTORY: Family History  Adopted: Yes  Problem Relation Age of Onset   Hyperlipidemia Mother    Hypertension Mother    Colon cancer Neg Hx    Stomach cancer Neg Hx    Esophageal cancer Neg Hx     SOCIAL HISTORY: Social History   Socioeconomic History   Marital status: Married    Spouse name: Janilah Hojnacki   Number of children: 2   Years of education: 14+   Highest education level: Some college, no degree  Occupational History   Occupation: Retired   Tobacco Use   Smoking status:  Never   Smokeless tobacco: Never  Vaping Use   Vaping Use: Never used  Substance and Sexual Activity   Alcohol use: Yes    Alcohol/week: 0.0 standard drinks of alcohol    Comment: once a year   Drug use: No   Sexual activity: Not on file  Other Topics Concern   Not on file  Social History Narrative   Married. She has 2 daughters. She works in Press photographer. No caffeine- allergic  Right handed         Social Determinants of Health   Financial Resource Strain: Not on file  Food Insecurity: Not on file  Transportation Needs: Not on file  Physical Activity: Not on file  Stress: Not on file  Social Connections: Not on file  Intimate Partner Violence: Not on file      PHYSICAL EXAM  There were no vitals filed for this visit.     There is no height or weight on file to calculate BMI.  Generalized: Well developed, in no acute distress  Cardiology: normal rate and rhythm, no murmur noted Respiratory: clear to auscultation bilaterally  Neurological examination  Mentation: Alert oriented to time, place, history taking. Follows all commands speech and language fluent Cranial nerve II-XII: Pupils were equal round reactive to light. Extraocular movements were full, visual field were full  Motor: The motor testing reveals 5 over 5 strength of all 4 extremities. Good symmetric motor tone is noted throughout.  Gait and station: Gait is normal.    DIAGNOSTIC DATA (LABS, IMAGING, TESTING) - I reviewed patient records, labs, notes, testing and imaging myself where available.      No data to display           Lab Results  Component Value Date   WBC 9.0 04/06/2018   HGB 12.5 04/06/2018   HCT 39.0 04/06/2018   MCV 95 04/06/2018   PLT 390.0 12/09/2017      Component Value Date/Time   NA 139 07/21/2018 0827   K 4.3 07/21/2018 0827   CL 103 07/21/2018 0827   CO2 21 07/21/2018 0827   GLUCOSE 87 07/21/2018 0827   GLUCOSE 96 12/09/2017 1020   BUN 13 07/21/2018 0827    CREATININE 1.00 07/21/2018 0827   CALCIUM 9.9 07/21/2018 0827   PROT 7.4 07/21/2018 0827   ALBUMIN 4.2 07/21/2018 0827   AST 17 07/21/2018 0827   ALT 20 07/21/2018 0827   ALKPHOS 28 (L) 07/21/2018 0827   BILITOT 0.3 07/21/2018 0827   GFRNONAA 60 07/21/2018 0827   GFRAA 69 07/21/2018 0827   Lab Results  Component Value Date   CHOL 140 07/21/2018   HDL 38 (L) 07/21/2018   LDLCALC 74 07/21/2018   TRIG 138 07/21/2018   Lab Results  Component Value Date   HGBA1C 5.8 (H) 07/21/2018   Lab Results  Component Value Date   VITAMINB12 392 04/06/2018   Lab Results  Component Value Date   TSH 1.820 04/06/2018       ASSESSMENT AND PLAN 66 y.o. year old female  has a past medical history of Angiomyolipoma of left kidney, Arthritis, Bronchitis, Complication of anesthesia, Diabetes mellitus without complication ([place]), Environmental and seasonal allergies, GERD (gastroesophageal reflux disease), Glaucoma, Hyperlipemia, Hypertension, Lower back pain, Mini stroke, Multiple food allergies, Optic nerve disorder, Pneumonia (2014), PONV (postoperative nausea and vomiting), Prediabetes, Rheumatoid arthritis ([place]), Sleep apnea, and Vitamin D deficiency. here with   No diagnosis found.   Barbarann is doing better. She is working on compliance and feeling better. Most recent compliance data shows 60% daily and 4 hour compliance. She was encouraged to continue working to meet minimum goal of 70% compliance. I have encouraged her to use CPAP every night for at least 4 hours.I have encouraged her to start therapy as soon as she gets in bed or consider setting an alarm to remind her in case she falls asleep. Healthy lifestyle habits advised. She will return to see me in 6 months,  sooner if needed.  She verbalizes understanding and agreement with this plan.   No orders of the defined types were placed in this encounter.     No orders of the defined types were placed in this encounter.      Debbora Presto,  FNP-C 03/31/2022, 4:04 PM [facility] [address] [place], [place] [address] [phone]

## 2022-03-31 NOTE — Patient Instructions (Incomplete)
Please continue using your CPAP regularly. While your insurance requires that you use CPAP at least 4 hours each night on 70% of the nights, I recommend, that you not skip any nights and use it throughout the night if you can. Getting used to CPAP and staying with the treatment long term does take time and patience and discipline. Untreated obstructive sleep apnea when it is moderate to severe can have an adverse impact on cardiovascular health and raise her risk for heart disease, arrhythmias, hypertension, congestive heart failure, stroke and diabetes. Untreated obstructive sleep apnea causes sleep disruption, nonrestorative sleep, and sleep deprivation. This can have an impact on your day to day functioning and cause daytime sleepiness and impairment of cognitive function, memory loss, mood disturbance, and problems focussing. Using CPAP regularly can improve these symptoms.   Please follow up in 1 year

## 2022-04-03 ENCOUNTER — Ambulatory Visit: Payer: Medicare Other | Admitting: Family Medicine

## 2022-04-03 ENCOUNTER — Encounter: Payer: Self-pay | Admitting: Family Medicine

## 2022-04-03 VITALS — BP 133/74 | HR 119 | Ht 65.0 in | Wt 205.0 lb

## 2022-04-03 DIAGNOSIS — G4733 Obstructive sleep apnea (adult) (pediatric): Secondary | ICD-10-CM | POA: Diagnosis not present

## 2022-09-26 DIAGNOSIS — M7552 Bursitis of left shoulder: Secondary | ICD-10-CM | POA: Insufficient documentation

## 2022-12-17 ENCOUNTER — Ambulatory Visit
Admission: RE | Admit: 2022-12-17 | Discharge: 2022-12-17 | Disposition: A | Discharge status: Home / Self Care | Payer: Medicare Other | Source: Ambulatory Visit | Attending: Family Medicine | Admitting: Family Medicine

## 2022-12-17 DIAGNOSIS — M810 Age-related osteoporosis without current pathological fracture: Secondary | ICD-10-CM

## 2022-12-17 DIAGNOSIS — Z1231 Encounter for screening mammogram for malignant neoplasm of breast: Secondary | ICD-10-CM

## 2023-04-02 NOTE — Patient Instructions (Addendum)

## 2023-04-02 NOTE — Progress Notes (Signed)
PATIENT: [name] [name] DOB: September 02, 1955  REASON FOR VISIT: follow up HISTORY FROM: patient  Chief Complaint  Patient presents with   Room 1    Pt is here Alone. Pt states that things have been going good with her CPAP. Pt states that her mask gives her a rash still.     HISTORY OF PRESENT ILLNESS:  04/06/2023 ALL: [name] [name] returns for follow up for OSA on CPAP. She continues to do well on therapy. She does report difficulty with nasal irritation over the past month that has contributed to lower compliance. She had been using Aquaphor that helps but is interested in seeing if there is a different style nasal pillow that would work better for her. She denies concerns with machine. She does note improvement in sleep quality on therapy. Current machine set up 2019.      04/03/2022 ALL: [name] [name] returns for follow up for OSA on CPAP. She reports doing well. She has worked on improving compliance. Previously 60% daily compliance, now 80%. She does note improvement in energy levels. She denies concerns with machine or supplies. She is using nasal pillow. She does use Aquaphor around nose to limit irritation. She is having some radicular pain in left leg. She is followed by Dr Wynetta Emery.     09/26/2021 ALL: [name] [name] returns for follow up for OSA on CPAP. She was last seen 03/2021 and continued to have difficulty with compliance. She met 58% compliance over 90 days at that time. She has continues to work on improving compliance. She is doing well on CPAP. She endorses better sleep quality and more energy when using CPAP. She admits that she often falls asleep before starting therapy.   She has joined silver sneakers and walking more. She had acute sciatic pain that limited activity recently but doing a little better.     03/21/2021 ALL:  [name] [name] returns for follow up for OSA on CPAP. She has been working on compliance. She is feeling better. She continues to work on getting comfortable with her  mask. She is using nasal pillow. She is resting better. She denies difficulty with her machine.     11/29/2020 ALL:  [name] [name] returns for follow up for OSA. She has not used CPAP, recently. She reports several life changes with a move to [place] have contributed to discontinuation of CPAP. She is not feeling as well as she was. She is not sleeping as well. She has more stress with life changes. She is having more headaches. She describes sharp pains at various locations all over her head. Headaches wax and wane. No pattern or clear periods of time when headaches resolve. Headache can last hours or days. She usually takes Tylenol that helps. She has had similar headaches for years. She has a history of microvascular disease and was concerned she may need another MRI. She does recognize need for sleep apnea management. BP is usually 120's/70's. She feels it is elevated today due to rushing to get to her appt and having [employer] for breakfast. She is taking blood pressure medications and followed by PCP regularly.   02/16/2020 ALL:  [name] [name] is a 67 y.o. female here today for follow up OSA on CPAP therapy.  She admits to continued difficulty with CPAP compliance.  There have been multiple factors contributing.  She reports being more compliant over the past 30 days.  She does recognize the importance of using CPAP therapy.  She does report improved sleep quality and  daytime energy levels when using CPAP therapy.  She denies any difficulty with her CPAP or supplies.  She is motivated to continue using CPAP therapy.    Compliance report dated 01/16/2020 through 02/14/2020 reveals she used CPAP 17 of the past 30 days for compliance of 57%.  She used CPAP greater than 4 hours 17 of the past 30 days for compliance of 57%.  Average usage on days used was 6 hours and 58 minutes.  Residual AHI was 0.2 on 5 to 12 cm of water and EPR 3.  There was no significant leak noted.  HISTORY: (copied from my  note on 08/10/2019)  [name] [name] is a 67 y.o. female here today for follow up post lacunar stroke in 2019 and OSA.  She reports that, overall, she is doing well.  She is followed closely by primary care.  She reports that blood pressures are typically well controlled.  She continues aspirin and atorvastatin for stroke prevention.  She reports that last A1c was 6.  No stroke symptoms since last being seen.   She admits that she continues to have difficulty with compliance using CPAP.  She feels like at the beginning of the year she was doing much better but then with stress from the pandemic and recently selling her home, she has fallen behind with compliance.  She does note benefit of using CPAP.  She is motivated to continue use.   Compliance report dated 07/10/2019 through 08/08/2019 reveals that she used CPAP 10 of the past 30 days for compliance of 33%.  She used CPAP greater than 4 hours 9 of the last 30 days for compliance of 30%.  Average usage on days used was 5 hours and 22 minutes.  Residual AHI was 0.1 on 5 to 12 cm of water and an EPR of 3.  There was no significant leak noted.   HISTORY: (copied from note on 03/30/2018)   HPI:  [name] is a 67 y.o. female patient, seen here in a Rv 03-30-2018,   I have the pleasure of meeting Ms. [name] [name] today she had a home sleep test by apnea link on 14 December 2017 and had to my surprise a rather moderate to severe degree of obstructive sleep apnea at an AHI of 25.7, RDI of 27.6 indicating that additional moderate snoring was present she also had a long this oxygenation time 28 minutes of her 240 minutes of test time were with out sufficient oxygenation.  The nadir for oxygen was 78%, but heart rate between 6160 bpm.  I recommended that she should try CPAP for obstructive sleep apnea at this degree.     CPAP was initiated and from mid-September on the patient began to use it more compliantly.  22 September to  22 October she used it for about  68% of the time with an average day user time of 6 hours 5 minutes, the residual AHI is 0.1 the pressure at the 95th percentile is 10.3 and we had another download here today over the last 30 days and unfortunately he the compliance is only 60% but the AHI remains very low at 0.1/h the 95th percentile pressure is 10.5 cm water.  I would like to increase the maximum pressure on her CPAP and urged her to use the machine each night for 4 hours minimum. She has used a nasal pillow dream wear- and cant sleep on her side as well.    CONSULT:  [name]. Goren reports that  she feels usually refreshed and rested after sleep, but often sleeps only 2 hours of sleep at night.  Her medical history includes hyperlipidemia, diabetes, hypertension, migraines, irritable bowel syndrome and a recent MRI of the brain that showed small vessel ischemia and a remote lacunar infarct in the left thalamus.  Overall it was unlikely that this gave her any symptoms of significance.  She also has gained weight over the last 5 to 10 years.  Was referred for healthy weight and wellness to Dr. Dalbert Garnet.  Dr. Lucia Gaskins wanted her to be evaluated for the possible presence of sleep apnea considering her risk for recurrent strokes and TIAs.  The patient continues on aspirin, blood pressure goal is to keep blood pressure below 130/90 mmHg, and her hemoglobin A1c below 6.5%.   Chief complaint according to patient : " Sometimes I just fall asleep, mostly I don't need a lot of sleep- I watch TV at night"    Sleep habits are as follows: "I am a night owl, I go to bed at 10 o'clock and I am asleep whenever.  Soma and gabapentin help to fall asleep. I can go 3 nights with 6-8 hours of sleep, and others with 2-3 hours". Husband sleeps immediately when he lays down. The marital  bedroom is cool (74 degrees is considered cool by her), with a ceiling fan.  Sleeps on 2 pillows and on her side, still has hot flushes, still menopausal symptoms- 20 years after total  hysterectomy and remains on HRT. The TV is running all night -and she wakes up when husband switches it off.  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 She reports not being angry, upset, anxious or under an unusual amount of stress. Rises at 5-6 AM. Husband stated she snores, as do their children, have noted irregular breathing. She doubts that. She has nocturia once each night. She sleeps in intervals of 2- 4 hours, and when waking up has no headaches, neither pain, but a dry mouth. She has this sleep pattern for a long time, many years.  She may spend a whole day in bed sleeping on and off after a day where she had to be on her feet a lot.   Sleep medical history and family sleep history: unaware of biological family history. Mother deceased.  The patient reports that she has chronic back pain and has had multiple back surgeries, for total.  She is seen Dr. Wynetta Emery for this.  She is not able to sit comfortably for longer than an hour or stand for longer than an hour.  She tries to move around or lays down to relieve her back pain.  She feels however that she does not need much sleep, and she watches TV at night in her bedroom.  Her history of diabetes hypertension and hyperlipidemia has been reviewed above.  Her current BMI is below 35.57.   Social history: 2 sons and one daughter, healthy- all adult.  lives with husband, shares her bedroom. Disabled due to back problems, 2 years. Worked in Audiological scientist, office job, sitting.  Non smoker, ETOH - once a month a drink. Caffeine; none.    Dr. Trevor Mace note from 09-11-2017 : KOURTNIE SACHS is a 67 y.o. female here as a referral from Dr. Parke Simmers for abnormal MRI.  Past medical history hyperlipidemia, diabetes, HTN, migraine, IBS. Patient was having neck pain and had MRI cervical spine  and saw something in the brain and a follow up was initiated. No  difficulties with memory or balance, she has back pain and symptoms related to that but no significant  balance issues or memory issues. No onset of symptoms, no acute onset of paresthesias however she does have problems in the legs due to back. She went due to problems in the arm, have been going on for the last 6 months, worsening with neck pain. Her grip on the right is worsening. She snores and stops breathing in her sleep.   History (copied from Dr Trevor Mace note on 03/15/2018)   Interval history: Here for follow up of stroke. This is a 67 year old patient who was referred for abnormal MRI.  Past medical history includes hyperlipidemia, diabetes, hypertension, migraine, IBS.  MRI of the brain showed chronic small vessel ischemia and a remote lacunar infarct in the left thalamus. Diagnosed with sleep apnea and is now using a cpap. She had moderately severe OSA AHI 26. She does not know if she is feeling better. She has an appointment with Dr. Vickey Huger the end of the month. Otherwise doing well, no problems, just having neck issues and ongoing dry needling. Reviewed report, she is doing well > 70% using cpap >=4 hours.    HPI:  BENNETT VANSCYOC is a 67 y.o. female here as a referral from Dr. Eli Phillips for abnormal MRI.  Past medical history hyperlipidemia, diabetes, HTN, migraine, IBS. Patient was having neck pain and had mri cervical and saw something in the brain and a follow up was initiated. No difficulties with memory or balance, she has back pain and symptoms related to that but no significant balance issues or memory issues. No onset of symptoms, no acute onset of paresthesias however she does have problems in the legs due to back. She went due to problems in the arm, have been going on for the last 6 months, worsening with neck pain. Her grip on the right is worsening. She snores and stops breathing in her sleep.   Reviewed notes, labs and imaging from outside physicians, which showed:   Reviewed referring physician notes for lesion of the brainstem.  MRI of the brain was performed by [facility].  Overall she remains asymptomatic.  She has chronic neck and back pain and she is doing physical therapy and working with pain management.  Reviewed thorough complete neurologic exam which was nonfocal.  MRI of the brain shows increased T2 signal in her brainstem with what appears to be expansion of the pons and small vessel ischemic changes throughout the white matter cerebral hemispheres.   Personally reviewed MRI brain images and agree with the following:   IMPRESSION: Pontine signal abnormality on previous cervical MRI is most likely chronic small vessel ischemia, which is also seen to a moderate degree in the cerebral white matter. There is remote lacunar infarct in the left thalamus. If there are bulbar symptoms a follow-up could be obtained to document stability.   hgba1c 6 2016    REVIEW OF SYSTEMS: Out of a complete 14 system review of symptoms, the patient complains only of the following symptoms, chronic pain, back pain and all other reviewed systems are negative.  ESS: 3/24  ALLERGIES: Allergies  Allergen Reactions   Adhesive [Tape] Dermatitis   Latex Dermatitis   Codeine Nausea And Vomiting   Erythromycin Itching and Swelling   Amoxicillin Diarrhea   Azithromycin    Macrolides And Ketolides    Other     FOOD ALLERGIES MSG  Green peas Onions cucumber Mustard Peanuts Corn sesame Yellow  onions lettuce RED DYE NO PERFUMES   Peanut-Containing Drug Products    Potassium Clavulanate [Clavulanic Acid]    Augmentin [Amoxicillin-Pot Clavulanate] Diarrhea    Per discussion 07/26/2012, pt tolerates Omnicef well.   Iodinated Contrast Media Itching    Tolerates iodinated contrast with Benadryl 50mg  PO prior to administration.  jkl    HOME MEDICATIONS: Outpatient Medications Prior to Visit  Medication Sig Dispense Refill   amLODipine (NORVASC) 5 MG tablet Take 5 mg by mouth daily.     aspirin EC 325 MG tablet Take 1 tablet (325 mg total) by mouth  daily. 30 tablet 0   atorvastatin (LIPITOR) 40 MG tablet Take 40 mg by mouth daily.     cyclobenzaprine (FLEXERIL) 5 MG tablet Take 5 mg by mouth 3 (three) times daily as needed for muscle spasms.     EPINEPHRINE, ANAPHYLAXIS THERAPY AGENTS, Inject 0.3 mg into the muscle.     estradiol (ESTRACE) 1 MG tablet Take 1 mg by mouth daily.     fenofibrate (TRICOR) 145 MG tablet Take 145 mg by mouth daily.     fluticasone (FLONASE) 50 MCG/ACT nasal spray Place 1 spray into both nostrils daily.     ibuprofen (ADVIL,MOTRIN) 800 MG tablet Take 800 mg by mouth every 8 (eight) hours as needed for moderate pain.     latanoprost (XALATAN) 0.005 % ophthalmic solution Place 1 drop into both eyes at bedtime.     montelukast (SINGULAIR) 10 MG tablet Take 10 mg by mouth at bedtime.     SitaGLIPtin-MetFORMIN HCl [id number] MG TB24 Take 1 tablet by mouth at bedtime.     valsartan-hydrochlorothiazide (DIOVAN-HCT) 160-12.5 MG tablet Take 1 tablet by mouth daily.     cholecalciferol (VITAMIN D3) 25 MCG (1000 UT) tablet Take 2,000 Units by mouth daily. (Patient not taking: Reported on 04/06/2023)     potassium chloride SA (K-DUR,KLOR-CON) 20 MEQ tablet Take 20 mEq by mouth daily. (Patient not taking: Reported on 04/06/2023)     No facility-administered medications prior to visit.    PAST MEDICAL HISTORY: Past Medical History:  Diagnosis Date   Angiomyolipoma of left kidney    Arthritis    left foot   Bronchitis    Complication of anesthesia    Patient woke up during bunionectomy and breast cyst removal surgery   Diabetes mellitus without complication (HCC)    Environmental and seasonal allergies    GERD (gastroesophageal reflux disease)    Glaucoma    Bilateral   Hyperlipemia    Hypertension    Lower back pain    Mini stroke    Multiple food allergies    MSG, green peas, onions, cucumber, mustard, peanuts, corn, sesame, lettuce, red dye, perfumes   Optic nerve disorder    left eye   Pneumonia 2014   PONV  (postoperative nausea and vomiting)    Slight nausea   Prediabetes    Rheumatoid arthritis (HCC)    Sleep apnea    Vitamin D deficiency     PAST SURGICAL HISTORY: Past Surgical History:  Procedure Laterality Date   ABDOMINAL HYSTERECTOMY     BACK SURGERY     X 4   BREAST CYST EXCISION Bilateral    20+ years ago   BUNIONECTOMY Bilateral    CATARACT EXTRACTION     CATARACT EXTRACTION, BILATERAL Bilateral 2019   with lens implant; Dr. Hazle Quant. June 2019 Left eye; July 2019 Right eye.   COLONOSCOPY     EYE SURGERY  FINGER SURGERY Left    Thumb    FAMILY HISTORY: Family History  Adopted: Yes  Problem Relation Age of Onset   Hyperlipidemia Mother    Hypertension Mother    Colon cancer Neg Hx    Stomach cancer Neg Hx    Esophageal cancer Neg Hx     SOCIAL HISTORY: Social History   Socioeconomic History   Marital status: Married    Spouse name: Niyonna Betsill   Number of children: 2   Years of education: 14+   Highest education level: Some college, no degree  Occupational History   Occupation: Retired   Tobacco Use   Smoking status: Never   Smokeless tobacco: Never  Vaping Use   Vaping status: Never Used  Substance and Sexual Activity   Alcohol use: Yes    Alcohol/week: 0.0 standard drinks of alcohol    Comment: once a year   Drug use: No   Sexual activity: Not on file  Other Topics Concern   Not on file  Social History Narrative   Married. She has 2 daughters. She works in Audiological scientist. No caffeine- allergic    Right handed         Social Determinants of Health   Financial Resource Strain: Not on file  Food Insecurity: Not on file  Transportation Needs: Not on file  Physical Activity: Not on file  Stress: Not on file  Social Connections: Unknown (09/18/2022)   [facility], [facility]   Social Network    Social Network: Not on file  Intimate Partner Violence: Unknown (09/18/2022)   [facility], [facility]   HITS     Physically Hurt: Not on file    Insult or Talk Down To: Not on file    Threaten Physical Harm: Not on file    Scream or Curse: Not on file      PHYSICAL EXAM  Vitals:   04/06/23 1045  BP: 125/81  Pulse: 95  Weight: 205 lb (93 kg)  Height: 5\' 5"  (1.651 m)   Neck cir: 15.25" Mallampati 3  Body mass index is 34.11 kg/m.  Generalized: Well developed, in no acute distress  Cardiology: normal rate and rhythm, no murmur noted Respiratory: clear to auscultation bilaterally  Neurological examination  Mentation: Alert oriented to time, place, history taking. Follows all commands speech and language fluent Cranial nerve II-XII: Pupils were equal round reactive to light. Extraocular movements were full, visual field were full  Motor: The motor testing reveals 5 over 5 strength of all 4 extremities. Good symmetric motor tone is noted throughout.  Gait and station: Gait is stable, left limp     DIAGNOSTIC DATA (LABS, IMAGING, TESTING) - I reviewed patient records, labs, notes, testing and imaging myself where available.      No data to display           Lab Results  Component Value Date   WBC 9.0 04/06/2018   HGB 12.5 04/06/2018   HCT 39.0 04/06/2018   MCV 95 04/06/2018   PLT 390.0 12/09/2017      Component Value Date/Time   NA 139 07/21/2018 0827   K 4.3 07/21/2018 0827   CL 103 07/21/2018 0827   CO2 21 07/21/2018 0827   GLUCOSE 87 07/21/2018 0827   GLUCOSE 96 12/09/2017 1020   BUN 13 07/21/2018 0827   CREATININE 1.00 07/21/2018 0827   CALCIUM 9.9 07/21/2018 0827   PROT 7.4 07/21/2018 0827   ALBUMIN 4.2 07/21/2018  0827   AST 17 07/21/2018 0827   ALT 20 07/21/2018 0827   ALKPHOS 28 (L) 07/21/2018 0827   BILITOT 0.3 07/21/2018 0827   GFRNONAA 60 07/21/2018 0827   GFRAA 69 07/21/2018 0827   Lab Results  Component Value Date   CHOL 140 07/21/2018   HDL 38 (L) 07/21/2018   LDLCALC 74 07/21/2018   TRIG 138 07/21/2018   Lab Results  Component Value Date    HGBA1C 5.8 (H) 07/21/2018   Lab Results  Component Value Date   VITAMINB12 392 04/06/2018   Lab Results  Component Value Date   TSH 1.820 04/06/2018       ASSESSMENT AND PLAN 67 y.o. year old female  has a past medical history of Angiomyolipoma of left kidney, Arthritis, Bronchitis, Complication of anesthesia, Diabetes mellitus without complication (HCC), Environmental and seasonal allergies, GERD (gastroesophageal reflux disease), Glaucoma, Hyperlipemia, Hypertension, Lower back pain, Mini stroke, Multiple food allergies, Optic nerve disorder, Pneumonia (2014), PONV (postoperative nausea and vomiting), Prediabetes, Rheumatoid arthritis (HCC), Sleep apnea, and Vitamin D deficiency. here with     ICD-10-CM   1. OSA on CPAP  G47.33 For home use only DME continuous positive airway pressure (CPAP)    Home sleep test      Gentry is doing well. She is working on compliance and feeling better. Most recent compliance data shows 60% compliance. She is eligible for new machine. I have encouraged her to use CPAP every night for at least 4 hours. I ill order repeat HST with plans to order new CPAP pending results. Healthy lifestyle habits advised. She will return to see me pending set up of new CPAP machine. She verbalizes understanding and agreement with this plan.   Orders Placed This Encounter  Procedures   For home use only DME continuous positive airway pressure (CPAP)    Supplies, would like to discuss options for nasal pillow barrier cloth or different style pillow.    Order Specific Question:   Length of Need    Answer:   Lifetime    Order Specific Question:   Patient has OSA or probable OSA    Answer:   Yes    Order Specific Question:   Is the patient currently using CPAP in the home    Answer:   Yes    Order Specific Question:   Settings    Answer:   Other see comments    Order Specific Question:   CPAP supplies needed    Answer:   Mask, headgear, cushions, filters, heated  tubing and water chamber   Home sleep test    Standing Status:   Future    Standing Expiration Date:   04/05/2024    Order Specific Question:   Where should this test be performed:    Answer:   [facility]      No orders of the defined types were placed in this encounter.      Shawnie Dapper, FNP-C 04/06/2023, 11:37 AM [facility] [address], [address] [place], [place] [address] [phone]

## 2023-04-06 ENCOUNTER — Ambulatory Visit: Payer: Medicare Other | Admitting: Family Medicine

## 2023-04-06 ENCOUNTER — Encounter: Payer: Self-pay | Admitting: Family Medicine

## 2023-04-06 VITALS — BP 125/81 | HR 95 | Ht 65.0 in | Wt 205.0 lb

## 2023-04-06 DIAGNOSIS — G4733 Obstructive sleep apnea (adult) (pediatric): Secondary | ICD-10-CM

## 2023-05-05 ENCOUNTER — Ambulatory Visit (INDEPENDENT_AMBULATORY_CARE_PROVIDER_SITE_OTHER): Payer: Medicare Other | Admitting: Neurology

## 2023-05-05 DIAGNOSIS — Z789 Other specified health status: Secondary | ICD-10-CM

## 2023-05-05 DIAGNOSIS — G8929 Other chronic pain: Secondary | ICD-10-CM

## 2023-05-05 DIAGNOSIS — G4733 Obstructive sleep apnea (adult) (pediatric): Secondary | ICD-10-CM | POA: Diagnosis not present

## 2023-05-06 NOTE — Progress Notes (Signed)
[facility]   HOME SLEEP TEST REPORT ( by Watch PAT)   mail out test device  STUDY DATA:  05-06-2023   [name] [name] Female, 67 y.o., 1956/01/24 MRN: [id number] ORDERING CLINICIAN: Shawnie Dapper, Np REFERRING CLINICIAN: Paula Compton smith,MD   CLINICAL INFORMATION/HISTORY:  see office visit on 04-06-2023 with Shawnie Dapper, NP .  Her diagnostic study was by apnea link device in 2019- needs new CPAP machine. 04/06/2023 ALL: Malaiah returns for follow up for OSA on CPAP. She continues to do well on therapy. She does report difficulty with nasal irritation over the past month that has contributed to lower compliance. She had been using Aquaphor that helps but is interested in seeing if there is a different style nasal pillow that would work better for her. She denies concerns with machine. She does note improvement in sleep quality on therapy. Current machine set up 2019.    Epworth sleepiness score: x/24.   BMI: 34 kg/m   Neck Circumference: 15.25"   FINDINGS:   Sleep Summary:   Total Recording Time (hours, min):   9 hours 59 minutes  Total Sleep Time (hours, min):    7 hours 7 minutes             Percent REM (%):   27%                                     Respiratory Indices displayed by CMS criteria:   Calculated pAHI (per hour):     CMS AHI of 14.9/h       (AASM criteria lead to an AHI of 29.3/h)                 REM pAHI:     CMS AHI of 36/h         (AASM criteria placed this AHI at 55.3/h!!)                                   NREM pAHI:     CMS AHI of 9.1/h                         Supine AHI:   311 minutes in supine sleep were recorded with an AHI of 15.2 following CMS criteria and of 33 following AASM criteria.  Nonsupine sleep on the right side was associated with an AASM correct.  Based AHI of 20.8 and CMS criteria AHI of 2.7/h.                                                Oxygen Saturation Statistics:   Oxygen Saturation (%) Mean:   93%                     O2 Saturation Range (%):        Between the nadir of 83% with a maximum saturation of 98%.                               O2 Saturation (minutes) <89%: 4 minutes  Pulse Rate Statistics:   Pulse Mean (bpm): 74 bpm               Pulse Range:     Between 61 and 97 bpm            IMPRESSION:  This HST confirms the ongoing presence of sleep apnea, it is similar severity as documented in 2019 by apnea link.  It is a CMS criteria that have changed the AHI for this patient by eliminating hypopneas of 3% or less desaturation. 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 There was also a very strong positional dependence noted at the DME should fit this patient with a mask that allows for nonsupine sleep. In addition, it would likely be helpful for this patient to pursue weight loss which benefits mostly REM dependent apnea.  It can also help to sleep with a wedge or an adjustable bed and to elevate the head by 12 to 20 degrees, guided by personal comfort.  However none of these steps will at this time replace  positive airway pressure therapy, we will order a new auto titration device with a setting between 6 and 12 cm water pressure, 2 cm EPR, heated humidification and mask to be fitted by DME.   RECOMMENDATION:    INTERPRETING PHYSICIAN:   Melvyn Novas, MD

## 2023-05-15 NOTE — Patient Instructions (Signed)
[facility]   HOME SLEEP TEST REPORT ( by Watch PAT)   mail out test device  STUDY DATA:  05-06-2023   [name] [name] Female, 67 y.o., 1956/02/06 MRN: [id number] ORDERING CLINICIAN: Shawnie Dapper, Np REFERRING CLINICIAN: Paula Compton smith,MD   CLINICAL INFORMATION/HISTORY:  see office visit on 04-06-2023 with Shawnie Dapper, NP .  Her diagnostic study was by apnea link device in 2019- needs new CPAP machine. 04/06/2023 ALL: Melynie returns for follow up for OSA on CPAP. She continues to do well on therapy. She does report difficulty with nasal irritation over the past month that has contributed to lower compliance. She had been using Aquaphor that helps but is interested in seeing if there is a different style nasal pillow that would work better for her. She denies concerns with machine. She does note improvement in sleep quality on therapy. Current machine set up 2019.    Epworth sleepiness score: x/24.   BMI: 34 kg/m   Neck Circumference: 15.25"   FINDINGS:   Sleep Summary:   Total Recording Time (hours, min):   9 hours 59 minutes  Total Sleep Time (hours, min):    7 hours 7 minutes             Percent REM (%):   27%                                     Respiratory Indices displayed by CMS criteria:   Calculated pAHI (per hour):     CMS AHI of 14.9/h       (AASM criteria lead to an AHI of 29.3/h)                 REM pAHI:     CMS AHI of 36/h         (AASM criteria placed this AHI at 55.3/h!!)                                   NREM pAHI:     CMS AHI of 9.1/h                         Supine AHI:   311 minutes in supine sleep were recorded with an AHI of 15.2 following CMS criteria and of 33 following AASM criteria.  Nonsupine sleep on the right side was associated with an AASM correct.  Based AHI of 20.8 and CMS criteria AHI of 2.7/h.                                                Oxygen Saturation Statistics:   Oxygen Saturation (%) Mean:   93%                    O2  Saturation Range (%):        Between the nadir of 83% with a maximum saturation of 98%.                               O2 Saturation (minutes) <89%: 4 minutes  Pulse Rate Statistics:   Pulse Mean (bpm): 74 bpm               Pulse Range:     Between 61 and 97 bpm            IMPRESSION:  This HST confirms the ongoing presence of sleep apnea, it is similar severity as documented in 2019 by apnea link.  It is a CMS criteria that have changed the AHI for this patient by eliminating hypopneas of 3% or less desaturation. 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 There was also a very strong positional dependence noted at the DME should fit this patient with a mask that allows for nonsupine sleep. In addition, it would likely be helpful for this patient to pursue weight loss which benefits mostly REM dependent apnea.  It can also help to sleep with a wedge or an adjustable bed and to elevate the head by 12 to 20 degrees, guided by personal comfort.  However none of these steps will at this time replace  positive airway pressure therapy, we will order a new auto titration device with a setting between 6 and 12 cm water pressure, 2 cm EPR, heated humidification and mask to be fitted by DME.   RECOMMENDATION:    INTERPRETING PHYSICIAN:   Melvyn Novas, MD

## 2023-05-15 NOTE — Procedures (Signed)
[facility]   HOME SLEEP TEST REPORT ( by Watch PAT)   mail out test device  STUDY DATA:  05-06-2023   [name] [name] Female, 66 y.o., 13-Mar-1956 MRN: [id number] ORDERING CLINICIAN: Shawnie Dapper, Np REFERRING CLINICIAN: Paula Compton smith,MD   CLINICAL INFORMATION/HISTORY:  see office visit on 04-06-2023 with Shawnie Dapper, NP .  Her diagnostic study was by apnea link device in 2019- needs new CPAP machine. 04/06/2023 ALL: [name] [name] returns for follow up for OSA on CPAP. She continues to do well on therapy. She does report difficulty with nasal irritation over the past month that has contributed to lower compliance. She had been using Aquaphor that helps but is interested in seeing if there is a different style nasal pillow that would work better for her. She denies concerns with machine. She does note improvement in sleep quality on therapy. Current machine set up 2019.    Epworth sleepiness score: x/24.   BMI: 34 kg/m   Neck Circumference: 15.25"   FINDINGS:   Sleep Summary:   Total Recording Time (hours, min):   9 hours 59 minutes  Total Sleep Time (hours, min):    7 hours 7 minutes             Percent REM (%):   27%                                     Respiratory Indices displayed by CMS criteria:   Calculated pAHI (per hour):     CMS AHI of 14.9/h       (AASM criteria lead to an AHI of 29.3/h)                 REM pAHI:     CMS AHI of 36/h         (AASM criteria placed this AHI at 55.3/h!!)                                   NREM pAHI:     CMS AHI of 9.1/h                         Supine AHI:   311 minutes in supine sleep were recorded with an AHI of 15.2 /hfollowing CMS criteria and of 33/h following AASM criteria.    Non-supine sleep on the right side was associated with an AASM scoring criteria based AHI of 20.8/h and CMS criteria AHI of 2.7/h.                                                Oxygen Saturation Statistics:   Oxygen Saturation (%) Mean:   93%              O2 Saturation Range (%):        Between the nadir of 83% with a maximum saturation of 98%.                               O2 Saturation (minutes) <89%: 4 minutes          Pulse Rate Statistics:  Pulse Mean (bpm): 74 bpm               Pulse Range:     Between 61 and 97 bpm            IMPRESSION:  This HST confirms the ongoing presence of sleep apnea, it is similar severity as documented in 2019 by apnea link.   It was the application of Medicare -CMS criteria that have changed the AHI for this patient by eliminating hypopneas of 3% or less desaturation.   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 There was also a very strong positional dependence noted and the DME should fit this patient with a mask that allows for non-supine sleep. In addition, it would likely be helpful for this patient to pursue weight loss which benefits mostly REM dependent apnea.  It can also help to sleep with a wedge or an adjustable bed and to elevate the head by 12 to 20 degrees, guided by personal comfort.    However , none of these steps will at this time replace positive airway pressure therapy, we will order a new auto titration device with a setting between 6 and 12 cm water pressure, 2 cm EPR, heated humidification and mask to be fitted by DME.    INTERPRETING PHYSICIAN:   Melvyn Novas, MD

## 2023-08-03 ENCOUNTER — Telehealth: Payer: Self-pay | Admitting: Family Medicine

## 2023-08-03 NOTE — Telephone Encounter (Signed)
 Spoke to [place] new from [employer] . Nida Boatman stating supplies were delivered today on 08/03/2023

## 2023-08-03 NOTE — Telephone Encounter (Signed)
 [facility] Victorino Dike) patient now with Emi Belfast, requesting office notes, prescription for new CPAP and supplies and sleep study. Phone: [phone], Fax to: [phone].

## 2023-08-03 NOTE — Telephone Encounter (Addendum)
 Spoke to patient made her aware did send message to [employer] concerning her new CPAP machine  and supplies that were ordered by Dr Vickey Huger 12/20204 .Waiting on response from [employer]

## 2023-08-03 NOTE — Telephone Encounter (Signed)
 Pt ordered CPAP supplies from [facility] [phone] back on January 6th and they supposedly faxed here multiple times. She came up to see if someone look to see if it was received on Friday. She also has a release of information from the company that I will make a copy incase it's needed.

## 2023-08-03 NOTE — Telephone Encounter (Addendum)
 Spoke to patient per [place] new from [employer]   New, Georgana Curio, Werner Lean, CMA; New, Arna Medici,  I went back and checked and the last order from your office to me was 04/06/23 for supplies. I went into the patients [facility] account and I see an order for pap unit from 12/24 that looks like it was not sent to me.  I will pull this for you now.  ** The address that is on file per our resupply team is [address] [place].  I will ask for re-supply review her supply order.  ** I will ask for the order from 12/24 to be processed priority.  Thank you,  Brad New  Pt expressed understanding and thanked me for calling

## 2023-11-04 ENCOUNTER — Telehealth: Payer: Self-pay | Admitting: Family Medicine

## 2023-11-04 NOTE — Telephone Encounter (Signed)
 Have anything come from the fax machine for this pt?

## 2023-11-04 NOTE — Telephone Encounter (Signed)
 Ernestina Headland from  [facility] called in regards to Pt CPAP machine. Ernestina Headland states that  request form was faxed out June 2nd  2025 and has sent another one today . Representative would like to verify if faxed was received and signed by MD.  [facility]  Phone# [phone] Fax#    [phone]

## 2023-11-06 NOTE — Telephone Encounter (Addendum)
 Pt called stating that [employer] called her informing her that the was an issue with some paper that was faxed over. She states that they need to  refax the order to Fax [phone] Atten Genevieve Kerry. Pt stated that she would like a call once this paperwork has been received, sighed and faxed back. Please advise.

## 2023-11-06 NOTE — Telephone Encounter (Signed)
 [name] [name] from [facility] called in regards Paperwork being faxed over .  Informed [name] [name] to re faxed Paperwork  to Office so MD can sign and  fax back [facility]  Phone# [phone] Fax#    [phone]

## 2023-11-09 NOTE — Telephone Encounter (Signed)
 Called [employer] and spoke [name] [name] and she will fax the form to Pod [phone] to be signed.

## 2023-11-09 NOTE — Telephone Encounter (Signed)
 Form placed on Amy desk to be signed

## 2023-11-10 NOTE — Telephone Encounter (Signed)
 Form signed and faxed back to [facility] [phone], confirmation received.

## 2023-12-01 ENCOUNTER — Other Ambulatory Visit: Payer: Self-pay | Admitting: Family Medicine

## 2023-12-01 DIAGNOSIS — Z1231 Encounter for screening mammogram for malignant neoplasm of breast: Secondary | ICD-10-CM

## 2023-12-14 NOTE — Progress Notes (Unsigned)
 SABRA

## 2023-12-15 ENCOUNTER — Encounter: Payer: Self-pay | Admitting: Family Medicine

## 2023-12-15 ENCOUNTER — Ambulatory Visit: Admitting: Family Medicine

## 2023-12-15 VITALS — BP 132/72 | HR 84 | Ht 65.0 in | Wt 207.5 lb

## 2023-12-15 DIAGNOSIS — G4733 Obstructive sleep apnea (adult) (pediatric): Secondary | ICD-10-CM | POA: Diagnosis not present

## 2023-12-15 NOTE — Patient Instructions (Addendum)

## 2023-12-15 NOTE — Progress Notes (Signed)
 PATIENT: [name] [name] DOB: [birth year]  REASON FOR VISIT: follow up HISTORY FROM: patient  Chief Complaint  Patient presents with   RM2/CPAP    Pt is here Alone. Pt states that she loves her new CPAP Machine.     HISTORY OF PRESENT ILLNESS:  12/15/2023 ALL:  Bryah returns for follow up for OSA on CPAP. She is doing well. HST 05/2023 confirmed OSA with total AHI 14.9 per CMS criteria, 29.3 per AASM. New AutoPAP ordered. She is using CPAP most nights for about 7-8 hours, on average. She denies concerns with machine or supplies.     04/06/2023 ALL: Arien returns for follow up for OSA on CPAP. She continues to do well on therapy. She does report difficulty with nasal irritation over the past month that has contributed to lower compliance. She had been using Aquaphor that helps but is interested in seeing if there is a different style nasal pillow that would work better for her. She denies concerns with machine. She does note improvement in sleep quality on therapy. Current machine set up 2019.      04/03/2022 ALL: Azha returns for follow up for OSA on CPAP. She reports doing well. She has worked on improving compliance. Previously 60% daily compliance, now 80%. She does note improvement in energy levels. She denies concerns with machine or supplies. She is using nasal pillow. She does use Aquaphor around nose to limit irritation. She is having some radicular pain in left leg. She is followed by Dr Onetha.     09/26/2021 ALL: Hargis returns for follow up for OSA on CPAP. She was last seen 03/2021 and continued to have difficulty with compliance. She met 58% compliance over 90 days at that time. She has continues to work on improving compliance. She is doing well on CPAP. She endorses better sleep quality and more energy when using CPAP. She admits that she often falls asleep before starting therapy.   She has joined silver sneakers and walking more. She had acute sciatic pain  that limited activity recently but doing a little better.     03/21/2021 ALL:  Chariti returns for follow up for OSA on CPAP. She has been working on compliance. She is feeling better. She continues to work on getting comfortable with her mask. She is using nasal pillow. She is resting better. She denies difficulty with her machine.     11/29/2020 ALL:  Mrs Orne returns for follow up for OSA. She has not used CPAP, recently. She reports several life changes with a move to [place] have contributed to discontinuation of CPAP. She is not feeling as well as she was. She is not sleeping as well. She has more stress with life changes. She is having more headaches. She describes sharp pains at various locations all over her head. Headaches wax and wane. No pattern or clear periods of time when headaches resolve. Headache can last hours or days. She usually takes Tylenol  that helps. She has had similar headaches for years. She has a history of microvascular disease and was concerned she may need another MRI. She does recognize need for sleep apnea management. BP is usually 120's/70's. She feels it is elevated today due to rushing to get to her appt and having [employer] for breakfast. She is taking blood pressure medications and followed by PCP regularly.   02/16/2020 ALL:  Rafaelita Foister is a 68 y.o. female here today for follow up OSA on CPAP therapy.  She  admits to continued difficulty with CPAP compliance.  There have been multiple factors contributing.  She reports being more compliant over the past 30 days.  She does recognize the importance of using CPAP therapy.  She does report improved sleep quality and daytime energy levels when using CPAP therapy.  She denies any difficulty with her CPAP or supplies.  She is motivated to continue using CPAP therapy.    Compliance report dated 01/16/2020 through 02/14/2020 reveals she used CPAP 17 of the past 30 days for compliance of 57%.  She used CPAP  greater than 4 hours 17 of the past 30 days for compliance of 57%.  Average usage on days used was 6 hours and 58 minutes.  Residual AHI was 0.2 on 5 to 12 cm of water and EPR 3.  There was no significant leak noted.  HISTORY: (copied from my note on 08/10/2019)  KATRIEL CUTSFORTH is a 68 y.o. female here today for follow up post lacunar stroke in 2019 and OSA.  She reports that, overall, she is doing well.  She is followed closely by primary care.  She reports that blood pressures are typically well controlled.  She continues aspirin  and atorvastatin  for stroke prevention.  She reports that last A1c was 6.  No stroke symptoms since last being seen.   She admits that she continues to have difficulty with compliance using CPAP.  She feels like at the beginning of the year she was doing much better but then with stress from the pandemic and recently selling her home, she has fallen behind with compliance.  She does note benefit of using CPAP.  She is motivated to continue use.   Compliance report dated 07/10/2019 through 08/08/2019 reveals that she used CPAP 10 of the past 30 days for compliance of 33%.  She used CPAP greater than 4 hours 9 of the last 30 days for compliance of 30%.  Average usage on days used was 5 hours and 22 minutes.  Residual AHI was 0.1 on 5 to 12 cm of water and an EPR of 3.  There was no significant leak noted.   HISTORY: (copied from note on 03/30/2018)   HPI:  TIMMIE CALIX is a 68 y.o. female patient, seen here in a Rv 03-30-2018,   I have the pleasure of meeting Ms. Saquoia Sianez today she had a home sleep test by apnea link on 14 December 2017 and had to my surprise a rather moderate to severe degree of obstructive sleep apnea at an AHI of 25.7, RDI of 27.6 indicating that additional moderate snoring was present she also had a long this oxygenation time 28 minutes of her 240 minutes of test time were with out sufficient oxygenation.  The nadir for oxygen was 78%, but heart rate  between 6160 bpm.  I recommended that she should try CPAP for obstructive sleep apnea at this degree.     CPAP was initiated and from mid-September on the patient began to use it more compliantly.  22 September to  22 October she used it for about 68% of the time with an average day user time of 6 hours 5 minutes, the residual AHI is 0.1 the pressure at the 95th percentile is 10.3 and we had another download here today over the last 30 days and unfortunately he the compliance is only 60% but the AHI remains very low at 0.1/h the 95th percentile pressure is 10.5 cm water.  I would like to increase the  maximum pressure on her CPAP and urged her to use the machine each night for 4 hours minimum. She has used a nasal pillow dream wear- and cant sleep on her side as well.    CONSULT:  Mrs. Reddix reports that she feels usually refreshed and rested after sleep, but often sleeps only 2 hours of sleep at night.  Her medical history includes hyperlipidemia, diabetes, hypertension, migraines, irritable bowel syndrome and a recent MRI of the brain that showed small vessel ischemia and a remote lacunar infarct in the left thalamus.  Overall it was unlikely that this gave her any symptoms of significance.  She also has gained weight over the last 5 to 10 years.  Was referred for healthy weight and wellness to Dr. Verdon.  Dr. Ines wanted her to be evaluated for the possible presence of sleep apnea considering her risk for recurrent strokes and TIAs.  The patient continues on aspirin , blood pressure goal is to keep blood pressure below 130/90 mmHg, and her hemoglobin A1c below 6.5%.   Chief complaint according to patient :  Sometimes I just fall asleep, mostly I don't need a lot of sleep- I watch TV at night    Sleep habits are as follows: I am a night owl, I go to bed at 10 o'clock and I am asleep whenever.  Soma and gabapentin help to fall asleep. I can go 3 nights with 6-8 hours of sleep, and others with 2-3  hours. Husband sleeps immediately when he lays down. The marital  bedroom is cool (74 degrees is considered cool by her), with a ceiling fan.  Sleeps on 2 pillows and on her side, still has hot flushes, still menopausal symptoms- 20 years after total hysterectomy and remains on HRT. The TV is running all night -and she wakes up when husband switches it off.  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 She reports not being angry, upset, anxious or under an unusual amount of stress. Rises at 5-6 AM. Husband stated she snores, as do their children, have noted irregular breathing. She doubts that. She has nocturia once each night. She sleeps in intervals of 2- 4 hours, and when waking up has no headaches, neither pain, but a dry mouth. She has this sleep pattern for a long time, many years.  She may spend a whole day in bed sleeping on and off after a day where she had to be on her feet a lot.   Sleep medical history and family sleep history: unaware of biological family history. Mother deceased.  The patient reports that she has chronic back pain and has had multiple back surgeries, for total.  She is seen Dr. Onetha for this.  She is not able to sit comfortably for longer than an hour or stand for longer than an hour.  She tries to move around or lays down to relieve her back pain.  She feels however that she does not need much sleep, and she watches TV at night in her bedroom.  Her history of diabetes hypertension and hyperlipidemia has been reviewed above.  Her current BMI is below 35.57.   Social history: 2 sons and one daughter, healthy- all adult.  lives with husband, shares her bedroom. Disabled due to back problems, 2 years. Worked in Audiological scientist, office job, sitting.  Non smoker, ETOH - once a month a drink. Caffeine; none.    Dr. Sharion note from 09-11-2017 : KIERAN NACHTIGAL is a 68 y.o. female  here as a referral from Dr. Benjamine for abnormal MRI.  Past medical history hyperlipidemia,  diabetes, HTN, migraine, IBS. Patient was having neck pain and had MRI cervical spine  and saw something in the brain and a follow up was initiated. No difficulties with memory or balance, she has back pain and symptoms related to that but no significant balance issues or memory issues. No onset of symptoms, no acute onset of paresthesias however she does have problems in the legs due to back. She went due to problems in the arm, have been going on for the last 6 months, worsening with neck pain. Her grip on the right is worsening. She snores and stops breathing in her sleep.   History (copied from Dr Sharion note on 03/15/2018)   Interval history: Here for follow up of stroke. This is a 68 year old patient who was referred for abnormal MRI.  Past medical history includes hyperlipidemia, diabetes, hypertension, migraine, IBS.  MRI of the brain showed chronic small vessel ischemia and a remote lacunar infarct in the left thalamus. Diagnosed with sleep apnea and is now using a cpap. She had moderately severe OSA AHI 26. She does not know if she is feeling better. She has an appointment with Dr. Chalice the end of the month. Otherwise doing well, no problems, just having neck issues and ongoing dry needling. Reviewed report, she is doing well > 70% using cpap >=4 hours.    HPI:  GARNET CHATMON is a 68 y.o. female here as a referral from Dr. Bernardino for abnormal MRI.  Past medical history hyperlipidemia, diabetes, HTN, migraine, IBS. Patient was having neck pain and had mri cervical and saw something in the brain and a follow up was initiated. No difficulties with memory or balance, she has back pain and symptoms related to that but no significant balance issues or memory issues. No onset of symptoms, no acute onset of paresthesias however she does have problems in the legs due to back. She went due to problems in the arm, have been going on for the last 6 months, worsening with neck pain. Her grip on the right  is worsening. She snores and stops breathing in her sleep.   Reviewed notes, labs and imaging from outside physicians, which showed:   Reviewed referring physician notes for lesion of the brainstem.  MRI of the brain was performed by [facility].  Overall she remains asymptomatic.  She has chronic neck and back pain and she is doing physical therapy and working with pain management.  Reviewed thorough complete neurologic exam which was nonfocal.  MRI of the brain shows increased T2 signal in her brainstem with what appears to be expansion of the pons and small vessel ischemic changes throughout the white matter cerebral hemispheres.   Personally reviewed MRI brain images and agree with the following:   IMPRESSION: Pontine signal abnormality on previous cervical MRI is most likely chronic small vessel ischemia, which is also seen to a moderate degree in the cerebral white matter. There is remote lacunar infarct in the left thalamus. If there are bulbar symptoms a follow-up could be obtained to document stability.   hgba1c 6 2016    REVIEW OF SYSTEMS: Out of a complete 14 system review of symptoms, the patient complains only of the following symptoms, chronic pain, back pain and all other reviewed systems are negative.  ESS: 4/24  ALLERGIES: Allergies  Allergen Reactions   Adhesive [Tape] Dermatitis   Latex Dermatitis   Codeine  Nausea And Vomiting   Erythromycin Itching and Swelling   Amoxicillin Diarrhea   Azithromycin    Gluten Meal    Macrolides And Ketolides    Other     FOOD ALLERGIES MSG  Green peas Onions cucumber Mustard Peanuts Corn sesame Yellow onions lettuce RED DYE NO PERFUMES   Peanut-Containing Drug Products    Potassium Clavulanate [Clavulanic Acid]    Augmentin [Amoxicillin-Pot Clavulanate] Diarrhea    Per discussion 07/26/2012, pt tolerates Omnicef well.   Iodinated Contrast Media Itching    Tolerates iodinated contrast with Benadryl  50mg   PO prior to administration.  jkl    HOME MEDICATIONS: Outpatient Medications Prior to Visit  Medication Sig Dispense Refill   amLODipine  (NORVASC ) 5 MG tablet Take 5 mg by mouth daily.     aspirin  EC 325 MG tablet Take 1 tablet (325 mg total) by mouth daily. 30 tablet 0   atorvastatin  (LIPITOR) 40 MG tablet Take 40 mg by mouth daily.     cyclobenzaprine  (FLEXERIL ) 5 MG tablet Take 5 mg by mouth 3 (three) times daily as needed for muscle spasms.     estradiol  (ESTRACE ) 1 MG tablet Take 1 mg by mouth daily.     fenofibrate  (TRICOR ) 145 MG tablet Take 145 mg by mouth daily.     fluticasone  (FLONASE ) 50 MCG/ACT nasal spray Place 1 spray into both nostrils daily.     ibuprofen  (ADVIL ,MOTRIN ) 800 MG tablet Take 800 mg by mouth every 8 (eight) hours as needed for moderate pain.     latanoprost  (XALATAN ) 0.005 % ophthalmic solution Place 1 drop into both eyes at bedtime.     montelukast  (SINGULAIR ) 10 MG tablet Take 10 mg by mouth at bedtime.     SitaGLIPtin -MetFORMIN  HCl [phone] MG TB24 Take 1 tablet by mouth at bedtime.     valsartan -hydrochlorothiazide (DIOVAN -HCT) 160-12.5 MG tablet Take 1 tablet by mouth daily.     EPINEPHRINE , ANAPHYLAXIS THERAPY AGENTS, Inject 0.3 mg into the muscle. (Patient not taking: Reported on 12/15/2023)     No facility-administered medications prior to visit.    PAST MEDICAL HISTORY: Past Medical History:  Diagnosis Date   Angiomyolipoma of left kidney    Arthritis    left foot   Bronchitis    Complication of anesthesia    Patient woke up during bunionectomy and breast cyst removal surgery   Diabetes mellitus without complication (HCC)    Environmental and seasonal allergies    GERD (gastroesophageal reflux disease)    Glaucoma    Bilateral   Hyperlipemia    Hypertension    Lower back pain    Mini stroke    Multiple food allergies    MSG, green peas, onions, cucumber, mustard, peanuts, corn, sesame, lettuce, red dye, perfumes   Optic nerve disorder     left eye   Pneumonia 2014   PONV (postoperative nausea and vomiting)    Slight nausea   Prediabetes    Rheumatoid arthritis (HCC)    Sleep apnea    Vitamin D  deficiency     PAST SURGICAL HISTORY: Past Surgical History:  Procedure Laterality Date   ABDOMINAL HYSTERECTOMY     BACK SURGERY     X 4   BREAST CYST EXCISION Bilateral    20+ years ago   BUNIONECTOMY Bilateral    CATARACT EXTRACTION     CATARACT EXTRACTION, BILATERAL Bilateral 2019   with lens implant; Dr. Camillo. June 2019 Left eye; July 2019 Right eye.   COLONOSCOPY  EYE SURGERY     FINGER SURGERY Left    Thumb    FAMILY HISTORY: Family History  Adopted: Yes  Problem Relation Age of Onset   Hyperlipidemia Mother    Hypertension Mother    Colon cancer Neg Hx    Stomach cancer Neg Hx    Esophageal cancer Neg Hx     SOCIAL HISTORY: Social History   Socioeconomic History   Marital status: Married    Spouse name: Johanne Mcglade   Number of children: 2   Years of education: 14+   Highest education level: Some college, no degree  Occupational History   Occupation: Retired   Tobacco Use   Smoking status: Never   Smokeless tobacco: Never  Vaping Use   Vaping status: Never Used  Substance and Sexual Activity   Alcohol use: Yes    Alcohol/week: 0.0 standard drinks of alcohol    Comment: once a year   Drug use: No   Sexual activity: Not on file  Other Topics Concern   Not on file  Social History Narrative   Married. She has 2 daughters. She works in Audiological scientist. No caffeine- allergic    Right handed         Social Drivers of Corporate investment banker Strain: Not on file  Food Insecurity: Not on file  Transportation Needs: Not on file  Physical Activity: Not on file  Stress: Not on file  Social Connections: Unknown (09/18/2022)   Received from [facility]   Social Network    Social Network: Not on file  Intimate Partner Violence: Unknown (09/18/2022)   Received from [facility]    HITS    Physically Hurt: Not on file    Insult or Talk Down To: Not on file    Threaten Physical Harm: Not on file    Scream or Curse: Not on file      PHYSICAL EXAM  Vitals:   12/15/23 1433  BP: 132/72  Pulse: 84  SpO2: 99%  Weight: 207 lb 8 oz (94.1 kg)  Height: 5' 5 (1.651 m)    Neck cir: 15.25 Mallampati 3  Body mass index is 34.53 kg/m.  Generalized: Well developed, in no acute distress  Cardiology: normal rate and rhythm, no murmur noted Respiratory: clear to auscultation bilaterally  Neurological examination  Mentation: Alert oriented to time, place, history taking. Follows all commands speech and language fluent Cranial nerve II-XII: Pupils were equal round reactive to light. Extraocular movements were full, visual field were full  Motor: The motor testing reveals 5 over 5 strength of all 4 extremities. Good symmetric motor tone is noted throughout.  Gait and station: Gait is stable, left limp     DIAGNOSTIC DATA (LABS, IMAGING, TESTING) - I reviewed patient records, labs, notes, testing and imaging myself where available.      No data to display           Lab Results  Component Value Date   WBC 9.0 04/06/2018   HGB 12.5 04/06/2018   HCT 39.0 04/06/2018   MCV 95 04/06/2018   PLT 390.0 12/09/2017      Component Value Date/Time   NA 139 07/21/2018 0827   K 4.3 07/21/2018 0827   CL 103 07/21/2018 0827   CO2 21 07/21/2018 0827   GLUCOSE 87 07/21/2018 0827   GLUCOSE 96 12/09/2017 1020   BUN 13 07/21/2018 0827   CREATININE 1.00 07/21/2018 0827   CALCIUM  9.9 07/21/2018 0827   PROT  7.4 07/21/2018 0827   ALBUMIN  4.2 07/21/2018 0827   AST 17 07/21/2018 0827   ALT 20 07/21/2018 0827   ALKPHOS 28 (L) 07/21/2018 0827   BILITOT 0.3 07/21/2018 0827   GFRNONAA 60 07/21/2018 0827   GFRAA 69 07/21/2018 0827   Lab Results  Component Value Date   CHOL 140 07/21/2018   HDL 38 (L) 07/21/2018   LDLCALC 74 07/21/2018   TRIG 138 07/21/2018   Lab  Results  Component Value Date   HGBA1C 5.8 (H) 07/21/2018   Lab Results  Component Value Date   VITAMINB12 392 04/06/2018   Lab Results  Component Value Date   TSH 1.820 04/06/2018       ASSESSMENT AND PLAN 68 y.o. year old female  has a past medical history of Angiomyolipoma of left kidney, Arthritis, Bronchitis, Complication of anesthesia, Diabetes mellitus without complication (HCC), Environmental and seasonal allergies, GERD (gastroesophageal reflux disease), Glaucoma, Hyperlipemia, Hypertension, Lower back pain, Mini stroke, Multiple food allergies, Optic nerve disorder, Pneumonia (2014), PONV (postoperative nausea and vomiting), Prediabetes, Rheumatoid arthritis (HCC), Sleep apnea, and Vitamin D  deficiency. here with     ICD-10-CM   1. OSA on CPAP  G47.33       Lakiya is doing well. She is working on compliance and feeling better. Most recent compliance data shows 73% compliance. I have encouraged her to use CPAP every night for at least 4 hours.Healthy lifestyle habits advised. She will return to see me in 1 year. She verbalizes understanding and agreement with this plan.   No orders of the defined types were placed in this encounter.     No orders of the defined types were placed in this encounter.      Greig Forbes, FNP-C 12/15/2023, 2:39 PM [facility] [address], [address] [place], [place] [address] [phone]

## 2023-12-18 ENCOUNTER — Ambulatory Visit
Admission: RE | Admit: 2023-12-18 | Discharge: 2023-12-18 | Disposition: A | Discharge status: Home / Self Care | Source: Ambulatory Visit | Attending: Family Medicine | Admitting: Family Medicine

## 2023-12-18 DIAGNOSIS — Z1231 Encounter for screening mammogram for malignant neoplasm of breast: Secondary | ICD-10-CM

## 2024-12-19 ENCOUNTER — Ambulatory Visit: Admitting: Family Medicine
